# Patient Record
Sex: Female | Born: 1945
Health system: Southern US, Community
[De-identification: ages and names within clinical notes are randomized; demographics above are authoritative.]

## PROBLEM LIST (undated history)

## (undated) DIAGNOSIS — I2510 Atherosclerotic heart disease of native coronary artery without angina pectoris: Secondary | ICD-10-CM

## (undated) DIAGNOSIS — I10 Essential (primary) hypertension: Secondary | ICD-10-CM

## (undated) DIAGNOSIS — D72829 Elevated white blood cell count, unspecified: Principal | ICD-10-CM

## (undated) DIAGNOSIS — R011 Cardiac murmur, unspecified: Secondary | ICD-10-CM

## (undated) DIAGNOSIS — Z9889 Other specified postprocedural states: Secondary | ICD-10-CM

## (undated) DIAGNOSIS — I219 Acute myocardial infarction, unspecified: Secondary | ICD-10-CM

## (undated) DIAGNOSIS — H269 Unspecified cataract: Secondary | ICD-10-CM

## (undated) DIAGNOSIS — M1990 Unspecified osteoarthritis, unspecified site: Secondary | ICD-10-CM

## (undated) DIAGNOSIS — F32A Depression, unspecified: Secondary | ICD-10-CM

## (undated) DIAGNOSIS — T7840XA Allergy, unspecified, initial encounter: Secondary | ICD-10-CM

## (undated) DIAGNOSIS — Z79890 Hormone replacement therapy (postmenopausal): Secondary | ICD-10-CM

## (undated) DIAGNOSIS — C73 Malignant neoplasm of thyroid gland: Secondary | ICD-10-CM

## (undated) DIAGNOSIS — Z720 Tobacco use: Secondary | ICD-10-CM

## (undated) DIAGNOSIS — F419 Anxiety disorder, unspecified: Secondary | ICD-10-CM

## (undated) DIAGNOSIS — K589 Irritable bowel syndrome without diarrhea: Secondary | ICD-10-CM

## (undated) DIAGNOSIS — E785 Hyperlipidemia, unspecified: Secondary | ICD-10-CM

## (undated) DIAGNOSIS — E041 Nontoxic single thyroid nodule: Secondary | ICD-10-CM

## (undated) DIAGNOSIS — M8580 Other specified disorders of bone density and structure, unspecified site: Secondary | ICD-10-CM

## (undated) DIAGNOSIS — R112 Nausea with vomiting, unspecified: Secondary | ICD-10-CM

## (undated) DIAGNOSIS — K5790 Diverticulosis of intestine, part unspecified, without perforation or abscess without bleeding: Secondary | ICD-10-CM

## (undated) HISTORY — DX: Acute myocardial infarction, unspecified: I21.9

## (undated) HISTORY — PX: OTHER SURGICAL HISTORY: SHX169

## (undated) HISTORY — DX: Other specified disorders of bone density and structure, unspecified site: M85.80

## (undated) HISTORY — DX: Malignant neoplasm of thyroid gland: C73

## (undated) HISTORY — DX: Elevated white blood cell count, unspecified: D72.829

## (undated) HISTORY — PX: TUBAL LIGATION: SHX77

## (undated) HISTORY — DX: Unspecified cataract: H26.9

## (undated) HISTORY — DX: Hormone replacement therapy: Z79.890

## (undated) HISTORY — DX: Diverticulosis of intestine, part unspecified, without perforation or abscess without bleeding: K57.90

## (undated) HISTORY — DX: Tobacco use: Z72.0

## (undated) HISTORY — DX: Depression, unspecified: F32.A

## (undated) HISTORY — DX: Hyperlipidemia, unspecified: E78.5

## (undated) HISTORY — DX: Essential (primary) hypertension: I10

## (undated) HISTORY — DX: Allergy, unspecified, initial encounter: T78.40XA

## (undated) HISTORY — DX: Nontoxic single thyroid nodule: E04.1

## (undated) HISTORY — DX: Unspecified osteoarthritis, unspecified site: M19.90

## (undated) HISTORY — PX: SPINE SURGERY: SHX786

## (undated) HISTORY — DX: Irritable bowel syndrome, unspecified: K58.9

## (undated) HISTORY — DX: Cardiac murmur, unspecified: R01.1

## (undated) HISTORY — DX: Atherosclerotic heart disease of native coronary artery without angina pectoris: I25.10

---
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
## 1990-07-10 ENCOUNTER — Encounter (INDEPENDENT_AMBULATORY_CARE_PROVIDER_SITE_OTHER): Payer: Self-pay | Admitting: Gastroenterology

## 1998-07-29 ENCOUNTER — Other Ambulatory Visit: Admission: RE | Admit: 1998-07-29 | Discharge: 1998-07-29 | Payer: Self-pay | Admitting: Family Medicine

## 1999-04-20 DIAGNOSIS — I219 Acute myocardial infarction, unspecified: Secondary | ICD-10-CM

## 1999-04-20 HISTORY — DX: Acute myocardial infarction, unspecified: I21.9

## 1999-04-20 HISTORY — PX: CARDIAC CATHETERIZATION: SHX172

## 1999-08-12 ENCOUNTER — Other Ambulatory Visit: Admission: RE | Admit: 1999-08-12 | Discharge: 1999-08-12 | Payer: Self-pay | Admitting: Family Medicine

## 2000-03-28 ENCOUNTER — Inpatient Hospital Stay (HOSPITAL_COMMUNITY): Admission: EM | Admit: 2000-03-28 | Discharge: 2000-03-31 | Payer: Self-pay | Admitting: Emergency Medicine

## 2000-03-28 ENCOUNTER — Encounter: Payer: Self-pay | Admitting: Internal Medicine

## 2000-06-17 HISTORY — PX: BACK SURGERY: SHX140

## 2000-06-29 ENCOUNTER — Encounter: Payer: Self-pay | Admitting: Family Medicine

## 2000-06-29 ENCOUNTER — Ambulatory Visit (HOSPITAL_COMMUNITY): Admission: RE | Admit: 2000-06-29 | Discharge: 2000-06-29 | Payer: Self-pay | Admitting: Family Medicine

## 2000-06-30 ENCOUNTER — Encounter: Payer: Self-pay | Admitting: Neurosurgery

## 2000-06-30 ENCOUNTER — Inpatient Hospital Stay (HOSPITAL_COMMUNITY): Admission: EM | Admit: 2000-06-30 | Discharge: 2000-07-01 | Payer: Self-pay | Admitting: Emergency Medicine

## 2001-08-10 ENCOUNTER — Encounter: Payer: Self-pay | Admitting: Family Medicine

## 2001-08-10 ENCOUNTER — Encounter: Admission: RE | Admit: 2001-08-10 | Discharge: 2001-08-10 | Payer: Self-pay | Admitting: Family Medicine

## 2001-09-22 ENCOUNTER — Other Ambulatory Visit: Admission: RE | Admit: 2001-09-22 | Discharge: 2001-09-22 | Payer: Self-pay | Admitting: Family Medicine

## 2002-04-19 DIAGNOSIS — E041 Nontoxic single thyroid nodule: Secondary | ICD-10-CM

## 2002-04-19 DIAGNOSIS — C73 Malignant neoplasm of thyroid gland: Secondary | ICD-10-CM

## 2002-04-19 HISTORY — DX: Nontoxic single thyroid nodule: E04.1

## 2002-04-19 HISTORY — DX: Malignant neoplasm of thyroid gland: C73

## 2002-07-19 HISTORY — PX: CHOLECYSTECTOMY: SHX55

## 2002-08-09 ENCOUNTER — Encounter: Payer: Self-pay | Admitting: General Surgery

## 2002-08-14 ENCOUNTER — Encounter: Payer: Self-pay | Admitting: General Surgery

## 2002-08-14 ENCOUNTER — Encounter (INDEPENDENT_AMBULATORY_CARE_PROVIDER_SITE_OTHER): Payer: Self-pay | Admitting: Specialist

## 2002-08-14 ENCOUNTER — Observation Stay (HOSPITAL_COMMUNITY): Admission: RE | Admit: 2002-08-14 | Discharge: 2002-08-15 | Payer: Self-pay | Admitting: General Surgery

## 2002-08-23 ENCOUNTER — Encounter: Admission: RE | Admit: 2002-08-23 | Discharge: 2002-08-23 | Payer: Self-pay | Admitting: Family Medicine

## 2002-09-10 ENCOUNTER — Encounter: Payer: Self-pay | Admitting: General Surgery

## 2002-09-10 ENCOUNTER — Ambulatory Visit (HOSPITAL_COMMUNITY): Admission: RE | Admit: 2002-09-10 | Discharge: 2002-09-10 | Payer: Self-pay | Admitting: General Surgery

## 2002-10-03 ENCOUNTER — Encounter: Admission: RE | Admit: 2002-10-03 | Discharge: 2002-10-03 | Payer: Self-pay | Admitting: Family Medicine

## 2002-10-03 ENCOUNTER — Encounter: Payer: Self-pay | Admitting: Family Medicine

## 2003-01-31 ENCOUNTER — Other Ambulatory Visit: Admission: RE | Admit: 2003-01-31 | Discharge: 2003-01-31 | Payer: Self-pay | Admitting: Family Medicine

## 2003-02-18 HISTORY — PX: THYROIDECTOMY: SHX17

## 2003-08-18 HISTORY — PX: LIPOMA EXCISION: SHX5283

## 2003-10-04 ENCOUNTER — Encounter: Admission: RE | Admit: 2003-10-04 | Discharge: 2003-10-04 | Payer: Self-pay | Admitting: Family Medicine

## 2004-02-26 ENCOUNTER — Ambulatory Visit: Payer: Self-pay | Admitting: Cardiology

## 2004-10-06 ENCOUNTER — Encounter: Admission: RE | Admit: 2004-10-06 | Discharge: 2004-10-06 | Payer: Self-pay | Admitting: Family Medicine

## 2005-03-31 ENCOUNTER — Ambulatory Visit: Payer: Self-pay | Admitting: Cardiology

## 2005-04-08 ENCOUNTER — Ambulatory Visit: Payer: Self-pay

## 2005-10-07 ENCOUNTER — Encounter: Admission: RE | Admit: 2005-10-07 | Discharge: 2005-10-07 | Payer: Self-pay | Admitting: Family Medicine

## 2006-06-22 ENCOUNTER — Ambulatory Visit: Payer: Self-pay | Admitting: Cardiology

## 2006-11-03 ENCOUNTER — Encounter: Admission: RE | Admit: 2006-11-03 | Discharge: 2006-11-03 | Payer: Self-pay | Admitting: Family Medicine

## 2007-07-05 ENCOUNTER — Ambulatory Visit: Payer: Self-pay | Admitting: Cardiology

## 2007-11-17 ENCOUNTER — Encounter: Admission: RE | Admit: 2007-11-17 | Discharge: 2007-11-17 | Payer: Self-pay | Admitting: Family Medicine

## 2007-11-30 DIAGNOSIS — C73 Malignant neoplasm of thyroid gland: Secondary | ICD-10-CM

## 2007-11-30 DIAGNOSIS — I10 Essential (primary) hypertension: Secondary | ICD-10-CM | POA: Insufficient documentation

## 2007-11-30 DIAGNOSIS — K589 Irritable bowel syndrome without diarrhea: Secondary | ICD-10-CM | POA: Insufficient documentation

## 2007-11-30 DIAGNOSIS — E785 Hyperlipidemia, unspecified: Secondary | ICD-10-CM | POA: Insufficient documentation

## 2007-11-30 DIAGNOSIS — K219 Gastro-esophageal reflux disease without esophagitis: Secondary | ICD-10-CM | POA: Insufficient documentation

## 2007-11-30 DIAGNOSIS — M8580 Other specified disorders of bone density and structure, unspecified site: Secondary | ICD-10-CM | POA: Insufficient documentation

## 2007-11-30 DIAGNOSIS — I2510 Atherosclerotic heart disease of native coronary artery without angina pectoris: Secondary | ICD-10-CM | POA: Insufficient documentation

## 2007-11-30 HISTORY — DX: Malignant neoplasm of thyroid gland: C73

## 2007-12-06 ENCOUNTER — Ambulatory Visit: Payer: Self-pay | Admitting: Internal Medicine

## 2007-12-06 LAB — CONVERTED CEMR LAB: Sed Rate: 26 mm/hr — ABNORMAL HIGH (ref 0–22)

## 2007-12-07 LAB — CONVERTED CEMR LAB: Tissue Transglutaminase Ab, IgA: 0.2 units (ref <7)

## 2007-12-19 ENCOUNTER — Telehealth: Payer: Self-pay | Admitting: Internal Medicine

## 2007-12-19 ENCOUNTER — Ambulatory Visit: Payer: Self-pay | Admitting: Internal Medicine

## 2008-01-19 ENCOUNTER — Encounter: Payer: Self-pay | Admitting: Internal Medicine

## 2008-01-30 ENCOUNTER — Ambulatory Visit: Payer: Self-pay | Admitting: Internal Medicine

## 2008-09-18 ENCOUNTER — Ambulatory Visit: Payer: Self-pay | Admitting: Cardiology

## 2008-11-22 ENCOUNTER — Encounter: Admission: RE | Admit: 2008-11-22 | Discharge: 2008-11-22 | Payer: Self-pay | Admitting: Family Medicine

## 2009-10-29 ENCOUNTER — Ambulatory Visit: Payer: Self-pay | Admitting: Cardiology

## 2009-10-29 DIAGNOSIS — F17200 Nicotine dependence, unspecified, uncomplicated: Secondary | ICD-10-CM | POA: Insufficient documentation

## 2009-12-03 ENCOUNTER — Encounter: Admission: RE | Admit: 2009-12-03 | Discharge: 2009-12-03 | Payer: Self-pay | Admitting: Family Medicine

## 2010-05-19 NOTE — Assessment & Plan Note (Signed)
Summary: DIARRHEA IN AM/RH   History of Present Illness Visit Type: consult Primary GI MD: Lina Sar MD Primary Provider: Rudi Heap, M.D. Requesting Provider: Rudi Heap, M.D. Chief Complaint: diarrhea in a.m. History of Present Illness:   65 year old white female patient of Dr. Rudi Heap and Dr. Angeline Slim with chronic diarrhea. Her morning diarrhea has gotten worse in the last several months having 5-6 loose bowel movements a day starting around 5 in the morning when she gets up. She denies any nocturnal diarrhea, rectal bleeding or abdominal pain. Her weight has been stable. She had a flexible sigmoidoscopy by Dr.Bejou in 1992 which was normal. She subsequently had an attempt for colonoscopy but we don't have the record of it.  She had a very bad experience with pain and inadequate  sedation. Additional medical problems include coronary artery disease with occlusion of RCA in 2001, high blood pressure, hyperlipidemia, and follicular cancer of the thyroid. Patient is a smoker.  She has no symptoms of gastroesophageal reflux.   GI Review of Systems    Reports belching, bloating, chest pain, and  dysphagia with solids.      Denies abdominal pain, acid reflux, dysphagia with liquids, heartburn, loss of appetite, nausea, vomiting, vomiting blood, weight loss, and  weight gain.      Reports change in bowel habits, constipation, diarrhea, hemorrhoids, and  irritable bowel syndrome.     Denies anal fissure, black tarry stools, diverticulosis, fecal incontinence, heme positive stool, jaundice, light color stool, liver problems, rectal bleeding, and  rectal pain.     Updated Prior Medication List: SYNTHROID 137 MCG  TABS (LEVOTHYROXINE SODIUM) 1 tablet by mouth once daily METOPROLOL TARTRATE 50 MG  TABS (METOPROLOL TARTRATE) 1/2 tablet by mouth two times a day DIOVAN 160 MG  TABS (VALSARTAN) 1 tablet by mouth once daily SIMCOR 1000-20 MG  XR24H-TAB (NIACIN-SIMVASTATIN) 2 tablets by  mouth once daily ALENDRONATE SODIUM 70 MG  TABS (ALENDRONATE SODIUM) 1 tablet by mouth weekly VITAMIN D3 1000 UNIT  CAPS (CHOLECALCIFEROL) 1 tablet by mouth once daily LOVAZA 1 GM  CAPS (OMEGA-3-ACID ETHYL ESTERS) 1 tablet by mouth once daily CALCIUM CITRATE + D 300-100 MG-UNIT  TABS (CALCIUM CITRATE-VITAMIN D) 1 tablet once daily MULTIPLE VITAMINS   TABS (MULTIPLE VITAMIN) 1 tablet by mouth once daily ALIGN   CAPS (MISC INTESTINAL FLORA REGULAT) 1 tablet by mouth once daily TYLENOL PM EXTRA STRENGTH 500-25 MG  TABS (DIPHENHYDRAMINE-APAP (SLEEP)) 1 tablet by mouth as needed CO Q-10 150 MG  CAPS (COENZYME Q10) 1 tablet by mouth once daily  Current Allergies (reviewed today): ! [facility]  Past Medical History:    Reviewed history from 11/30/2007 and no changes required:       Current Problems:        GERD (ICD-530.81)       IRRITABLE BOWEL SYNDROME (ICD-564.1)       Hx of THYROID CANCER (ICD-193)       OSTEOPOROSIS (ICD-733.00)       DYSLIPIDEMIA (ICD-272.4)       HYPERTENSION (ICD-401.9)       CORONARY ARTERY DISEASE (ICD-414.00)       DIARRHEA (ICD-787.91)         Past Surgical History:    Reviewed history from 11/30/2007 and no changes required:       Appendectomy       tubal ligation       lumbar laminectomy       thyroidectomy       lipoma  removal rt. arm       cholecystecomy   Family History:    Reviewed history from 11/30/2007 and no changes required:       Family History of Heart Disease: Father       Family History of Breast Cancer:maternal grandmother              No FH of Colon Cancer:  Social History:    Reviewed history from 11/30/2007 and no changes required:       Patient currently smokes. -1/2 ppd       Alcohol Use - no       Illicit Drug Use - no       Daily Caffeine Use   Risk Factors:  Caffeine use:  1 drinks per day   Review of Systems       The patient complains of allergy/sinus, back pain, cough, fatigue, itching, night sweats, and  sleeping problems.         .ros   Vital Signs:  Patient Profile:   65 Years Old Female Height:     65 inches Weight:      165.38 pounds BMI:     27.62 BSA:     1.83 Pulse rate:   68 / minute Pulse rhythm:   regular BP sitting:   122 / 78  (left arm)  Vitals Entered By: Milford Cage CMA (December 06, 2007 9:11 AM)                  Physical Exam  General:     Well developed, well nourished, no acute distress. Neck:     post thyroidectomy scar; no nodules, no tenderness Lungs:     Clear throughout to auscultation. Heart:     Regular rate and rhythm; no murmurs, rubs,  or bruits. Abdomen:     abdomen is soft, mildly obese with normoactive bowel sounds. No distention, no tenderness.Liver edge at costal margin, post appendectomy scar RLQ,  laparoscopic cholecystectomy scar in right upper quadrant. Rectal:     normal rectal tone. Soft Hemoccult-negative stool in small amounts. Extremities:     No clubbing, cyanosis, edema or deformities noted. Neurologic:     Alert and  oriented x4;  grossly normal neurologically.    Impression & Recommendations:  Problem # 1:  IRRITABLE BOWEL SYNDROME (ICD-564.1) irritable bowel syndrome with predominant diarrhea, possible contributing factor is bile  overflow diarrhea since 2004 cholecystectomy. We need to rule out possibility of collagenous or microscopic colitis. We also need to rule out celiac sprue although I doubt that is the problem. The gas and diarrhea seem to be very inconvenient for the patient.  She has to  work every day  and has to get up 2 hours ahead of time to be able to make it to work.  She denies any accidents but has limited her social life because of the diarrhea. Orders: T-Tissue Transglutamase Ab IgA [id number] TLB-Sedimentation Rate (ESR) (85651-ESR) Colonoscopy (Colon)   Problem # 2:  GERD (ICD-530.81) currently not a problem.  Problem # 3:  Hx of THYROID CANCER (ICD-193) vault and the [facility]  in [place]. Patient has been followed every 6 months. Thyroid status has been in good control.   Patient Instructions: 1)  Colestid 1g 2 tablets q.am. 2)  Bentyl 20 mg one p.o. b.i.d. 3)  Colonoscopy with appropriate biopsies 4)  Tissue transglutaminase levels and sed rate today    Prescriptions: BENTYL 20 MG  TABS (DICYCLOMINE HCL) Take  1 tablet by mouth two times a day  #60 x 6   Entered by:   Hortense Ramal CMA   Authorized by:   Hart Carwin MD   Signed by:   Hortense Ramal CMA on 12/06/2007   Method used:   Electronically sent to ...       [facility]       [address]       [place], [place]  [address]       Ph: [phone]       Fax: [phone]   RxID:   [id number] COLESTID 1 GM  TABS (COLESTIPOL HCL) Take 2 tablets by mouth every morning.  #60 x 6   Entered by:   Hortense Ramal CMA   Authorized by:   Hart Carwin MD   Signed by:   Hortense Ramal CMA on 12/06/2007   Method used:   Electronically sent to ...       [facility]*       [address]       [place], [place]  [address]       Ph: [phone]       Fax: [phone]   RxID:   [id number] DULCOLAX 5 MG  TBEC (BISACODYL) Day before procedure take 2 at 3pm and 2 at 8pm.  #4 x 0   Entered by:   Hortense Ramal CMA   Authorized by:   Hart Carwin MD   Signed by:   Hortense Ramal CMA on 12/06/2007   Method used:   Electronically sent to ...       [facility]*       [address]       [place], [place]  [address]       Ph: [phone]       Fax: [phone]   RxID:   [id number] REGLAN 10 MG  TABS (METOCLOPRAMIDE HCL) As per prep instructions.  #2 x 0   Entered by:   Hortense Ramal CMA   Authorized by:   Hart Carwin MD   Signed by:   Hortense Ramal CMA on 12/06/2007   Method used:   Electronically sent to ...       [facility]*       [address]       [place], [place]  [address]       Ph: [phone]       Fax: [phone]   RxID:   [id number] MIRALAX   POWD (POLYETHYLENE GLYCOL 3350) As per prep  instructions.  #255gm x 0   Entered by:   Hortense Ramal CMA   Authorized by:   Hart Carwin MD   Signed by:   Hortense Ramal CMA on 12/06/2007   Method used:   Electronically sent to ...       [facility]*       [address]       [place], [place]  [address]       Ph: [phone]       Fax: [phone]   RxID:   [id number]  ]

## 2010-05-19 NOTE — Assessment & Plan Note (Signed)
Summary: Madison [name]  Medications Added ASPIRIN 325 MG  TABS (ASPIRIN) 1 by mouth dialy NITROSTAT 0.4 MG SUBL (NITROGLYCERIN) one every 5 minutes under tongue up to three times for chest pain, then call 911 if not resolved      Allergies Added:   Visit Type:  Follow-up Primary Provider:  Rudi Heap, M.D.  CC:  CAD.  History of Present Illness: The patient returns for yearly followup. Since I last saw her she has had no new cardiac problems. She has broken her foot twice and is just now recovering from this. She hasn't been able to ambulate because of this. She denies any chest pressure, neck or arm discomfort. She denies any palpitations, presyncope or syncope. She has no PND or orthopnea. I did review her lipids. Her LDL was excellent at 66 with an HDL of 37. The triglycerides were 214. Unfortunately she continues to smoke cigarettes.  Current Medications (verified): 1)  Synthroid 137 Mcg  Tabs (Levothyroxine Sodium) .Marland Kitchen.. 1 Tablet By Mouth Once Daily 2)  Metoprolol Tartrate 50 Mg  Tabs (Metoprolol Tartrate) .... 1/2 Tablet By Mouth Two Times A Day 3)  Diovan 160 Mg  Tabs (Valsartan) .Marland Kitchen.. 1 Tablet By Mouth Once Daily 4)  Simcor 1000-20 Mg  Xr24h-Tab (Niacin-Simvastatin) .... 2 Tablets By Mouth Once Daily 5)  Vitamin D3 1000 Unit  Caps (Cholecalciferol) .Marland Kitchen.. 1 Tablet By Mouth Once Daily 6)  Calcium Citrate + D 300-100 Mg-Unit  Tabs (Calcium Citrate-Vitamin D) .Marland Kitchen.. 1 Tablet Once Daily 7)  Multiple Vitamins   Tabs (Multiple Vitamin) .Marland Kitchen.. 1 Tablet By Mouth Once Daily 8)  Tylenol Pm Extra Strength 500-25 Mg  Tabs (Diphenhydramine-Apap (Sleep)) .Marland Kitchen.. 1 Tablet By Mouth As Needed 9)  Co Q-10 150 Mg  Caps (Coenzyme Q10) .Marland Kitchen.. 1 Tablet By Mouth Once Daily 10)  Aspirin 325 Mg  Tabs (Aspirin) .Marland Kitchen.. 1 By Mouth Dialy 11)  Nitrostat 0.4 Mg Subl (Nitroglycerin) .... One Every 5 Minutes Under Tongue Up To Three Times For Chest Pain, Then Call 911 If Not Resolved  Allergies (verified): 1)  !  Wellbutrin  Past History:  Past Medical History: GERD (ICD-530.81) IRRITABLE BOWEL SYNDROME (ICD-564.1) Hx of THYROID CANCER (ICD-193) OSTEOPOROSIS (ICD-733.00) DYSLIPIDEMIA (ICD-272.4) HYPERTENSION (ICD-401.9) CORONARY ARTERY DISEASE (ICD-414.00) (occluded right coronary artery in 2001.  A       70% LAD stenosis followed by 50% stenosis, 30% proximal circumflex       stenosis, EF 55%.  She had PTCA and stenting of the right coronary       artery).  DIARRHEA (ICD-787.91)  Past Surgical History: Appendectomy Tubal ligation Lumbar laminectomy Thyroidectomy Lpoma removal rt. arm Cholecystecomy  Review of Systems       As stated in the HPI and negative for all other systems.   Vital Signs:  Patient profile:   65 year old female Height:      65 inches Weight:      168 pounds BMI:     28.06 Pulse rate:   58 / minute Resp:     18 per minute BP sitting:   126 / 78  (right arm)  Vitals Entered By: Marrion Coy, CNA (October 29, 2009 9:36 AM)  Physical Exam  General:  Well developed, well nourished, in no acute distress. Head:  normocephalic and atraumatic Eyes:  PERRLA/EOM intact; conjunctiva and lids normal. Mouth:  Teeth, gums and palate normal. Oral mucosa normal. Neck:  Neck supple, no JVD. No masses, thyromegaly or abnormal cervical nodes. Chest Wall:  no deformities or breast masses noted Lungs:  Clear bilaterally to auscultation and percussion. Abdomen:  Bowel sounds positive; abdomen soft and non-tender without masses, organomegaly, or hernias noted. No hepatosplenomegaly. Msk:  Back normal, normal gait. Muscle strength and tone normal. Extremities:  No clubbing or cyanosis. Neurologic:  Alert and oriented x 3. Skin:  Intact without lesions or rashes. Cervical Nodes:  no significant adenopathy Axillary Nodes:  no significant adenopathy Inguinal Nodes:  no significant adenopathy Psych:  Normal affect.   Detailed Cardiovascular Exam  Neck    Carotids:  Carotids full and equal bilaterally without bruits.      Neck Veins: Normal, no JVD.    Heart    Inspection: no deformities or lifts noted.      Palpation: normal PMI with no thrills palpable.      Auscultation: regular rate and rhythm, S1, S2 without murmurs, rubs, gallops, or clicks.    Vascular    Abdominal Aorta: no palpable masses, pulsations, or audible bruits.      Femoral Pulses: normal femoral pulses bilaterally.      Pedal Pulses: normal pedal pulses bilaterally.      Radial Pulses: normal radial pulses bilaterally.      Peripheral Circulation: no clubbing, cyanosis, or edema noted with normal capillary refill.     EKG  Procedure date:  10/29/2009  Findings:      Sinus rhythm, rate of 50, axis within normal limits, intervals within normal limits, no acute ST-T wave changes.  Impression & Recommendations:  Problem # 1:  CORONARY ARTERY DISEASE (ICD-414.00) She has no new symptoms. No further cardiovascular testing is suggested. She needs more aggressive risk reduction. Orders: EKG w/ Interpretation (93000)  Problem # 2:  DYSLIPIDEMIA (ICD-272.4) Though she is not at target for her HDL I would suggest lifestyle changes to include exercise and diet before changing medications but I will defer to her primary physician.  Problem # 3:  HYPERTENSION (ICD-401.9) Her blood pressure is controlled. She will continue meds as listed. Orders: EKG w/ Interpretation (93000)  Problem # 4:  TOBACCO ABUSE (ICD-305.1) We discussed the need to stop smoking. She has failed multiple therapies but we'll continue to work on this.  Patient Instructions: 1)  Your physician recommends that you schedule a follow-up appointment in: 12 months with Dr Antoine Poche in [place] 2)  Your physician recommends that you continue on your current medications as directed. Please refer to the Current Medication list given to you today. Prescriptions: METOPROLOL TARTRATE 50 MG  TABS (METOPROLOL TARTRATE) 1/2  tablet by mouth two times a day  #90 x 3   Entered by:   Charolotte Capuchin, RN   Authorized by:   Rollene Rotunda, MD, [facility]   Signed by:   Charolotte Capuchin, RN on 10/29/2009   Method used:   Print then Give to Patient   RxID:   [id number] NITROSTAT 0.4 MG SUBL (NITROGLYCERIN) one every 5 minutes under tongue up to three times for chest pain, then call 911 if not resolved  #25 x prn   Entered by:   Charolotte Capuchin, RN   Authorized by:   Rollene Rotunda, MD, [facility]   Signed by:   Charolotte Capuchin, RN on 10/29/2009   Method used:   Electronically to        [facility] ([facility])       [address]       [place], [place]  [address]       Ph: [phone]       Fax: [phone]   RxID:   [id number]

## 2010-05-19 NOTE — Assessment & Plan Note (Signed)
Summary: followup after colon/tcw   History of Present Illness Visit Type: follow up Primary GI MD: Lina Sar MD Primary Provider: Rudi Heap, M.D. Requesting Provider: Rudi Heap, M.D. Chief Complaint: follow up appt colon History of Present Illness:   65 year old white female with irritable bowel syndrome and moderately severe diverticulosis of the left colon with partial obstruction  on colonoscopy on 12/19/07. She has fecal  urgency and occasional incontinence. She has been markedly improved on Bentyl  20 mg twice a day, Colestid 2 g a day. Unfortunately the Colestid interfered with the absorption of the Synthroid according to her endocrinologist Dr Nonie Hoyer. . Patient presented with severe diarrhea. and urgency incontinence. She is about 50% improved.   GI Review of Systems    Reports bloating.      Denies abdominal pain, acid reflux, belching, chest pain, dysphagia with liquids, dysphagia with solids, heartburn, loss of appetite, nausea, vomiting, vomiting blood, weight loss, and  weight gain.      Reports diarrhea, diverticulosis, and  irritable bowel syndrome.     Denies anal fissure, black tarry stools, change in bowel habit, constipation, fecal incontinence, heme positive stool, hemorrhoids, jaundice, light color stool, liver problems, rectal bleeding, and  rectal pain.     Prior Medication List:  SYNTHROID 137 MCG  TABS (LEVOTHYROXINE SODIUM) 1 tablet by mouth once daily METOPROLOL TARTRATE 50 MG  TABS (METOPROLOL TARTRATE) 1/2 tablet by mouth two times a day DIOVAN 160 MG  TABS (VALSARTAN) 1 tablet by mouth once daily SIMCOR 1000-20 MG  XR24H-TAB (NIACIN-SIMVASTATIN) 2 tablets by mouth once daily ALENDRONATE SODIUM 70 MG  TABS (ALENDRONATE SODIUM) 1 tablet by mouth weekly VITAMIN D3 1000 UNIT  CAPS (CHOLECALCIFEROL) 1 tablet by mouth once daily LOVAZA 1 GM  CAPS (OMEGA-3-ACID ETHYL ESTERS) 1 tablet by mouth once daily CALCIUM CITRATE + D 300-100 MG-UNIT  TABS (CALCIUM  CITRATE-VITAMIN D) 1 tablet once daily MULTIPLE VITAMINS   TABS (MULTIPLE VITAMIN) 1 tablet by mouth once daily ALIGN   CAPS (MISC INTESTINAL FLORA REGULAT) 1 tablet by mouth once daily TYLENOL PM EXTRA STRENGTH 500-25 MG  TABS (DIPHENHYDRAMINE-APAP (SLEEP)) 1 tablet by mouth as needed CO Q-10 150 MG  CAPS (COENZYME Q10) 1 tablet by mouth once daily COLESTID 1 GM  TABS (COLESTIPOL HCL) Take 2 tablets by mouth every morning. BENTYL 20 MG  TABS (DICYCLOMINE HCL) Take 1 tablet by mouth two times a day   Current Allergies (reviewed today): ! [facility]  Past Medical History:    Reviewed history from 11/30/2007 and no changes required:       Current Problems:        GERD (ICD-530.81)       IRRITABLE BOWEL SYNDROME (ICD-564.1)       Hx of THYROID CANCER (ICD-193)       OSTEOPOROSIS (ICD-733.00)       DYSLIPIDEMIA (ICD-272.4)       HYPERTENSION (ICD-401.9)       CORONARY ARTERY DISEASE (ICD-414.00)       DIARRHEA (ICD-787.91)         Past Surgical History:    Reviewed history from 12/06/2007 and no changes required:       Appendectomy       tubal ligation       lumbar laminectomy       thyroidectomy       lipoma removal rt. arm       cholecystecomy   Family History:    Reviewed history from 12/06/2007 and no  changes required:       Family History of Heart Disease: Father       Family History of Breast Cancer:maternal grandmother              No FH of Colon Cancer:  Social History:    Reviewed history from 12/06/2007 and no changes required:       Patient currently smokes. -1/2 ppd       Alcohol Use - no       Illicit Drug Use - no       Daily Caffeine Use    Review of Systems       The patient complains of allergy/sinus and back pain.     Vital Signs:  Patient Profile:   65 Years Old Female Height:     65 inches Weight:      163.50 pounds BMI:     27.31 BSA:     1.82 Pulse rate:   78 / minute Pulse rhythm:   regular BP sitting:   170 / 88  (right arm)   Vitals Entered By: Paulene Floor, RN (January 30, 2008 3:58 PM)                    Impression & Recommendations:  Problem # 1:  IRRITABLE BOWEL SYNDROME (ICD-564.1) irritable bowel syndrome and symptomatic diverticulosis of the left colon improved on regimen of  Colestid and antispasmodic. I have suggested adding Imodium 1 po  at bedtime. I have also given her samples of Metamucil to increase  the bulk of her stool  Problem # 2:  Hx of THYROID CANCER (ICD-193) on thyroid supplements. We will give  her Colestid to  at least 2 hours apart from all her other medications to prevent an interference with absorption of the remaining medications.she will have her TSH  rechecked  by Dr Nonie Hoyer in 6 weeks.   Patient Instructions: 1)  Colestid 2 g take at least 2 hours apart from all of the medications 2)  Continue all other medications 3)  A TSH checked as per Dr. Nonie Hoyer 4)   Imodium one at bedtime 5)  We discussed the possibility of segmental resection of her sigmoid colon if her symptoms become intolerable. At this time at would not recommend surgery    ]

## 2010-08-19 ENCOUNTER — Encounter: Payer: Self-pay | Admitting: Family Medicine

## 2010-09-01 NOTE — Assessment & Plan Note (Signed)
[facility]                            CARDIOLOGY OFFICE NOTE   NAME:Madison [name], Madison [name]                       MRN:          [id number]  DATE:07/05/2007                            DOB:          10-Jun-1945    PRIMARY CARE PHYSICIAN:  Ernestina Penna, M.D.   REASON FOR PRESENTATION:  Coronary disease.   HISTORY OF PRESENT ILLNESS:  The patient returns for yearly followup.  She has done quite well since I last saw her.  She bought a treadmill.  She is walking daily.  She has not been having any chest discomfort,  neck or arm discomfort.  She has not been having any palpitations,  presyncope or syncope.  She is having no PND or orthopnea.Marland Kitchen  Unfortunately she is still smoking cigarettes.   PAST MEDICAL HISTORY:  Coronary artery disease (occluded right coronary  artery in 2001, 70% LAD stenosis followed by 50% stenosis, 30% proximal  circumflex stenosis, EF 55%.  The patient had PTCA and stenting of the  right coronary artery.)  Hypertension, dyslipidemia with a low HDL and  elevated triglycerides, tobacco use, osteoporosis, follicular cell  thyroid cancer.   ALLERGIES AND INTOLERANCES:  WELLBUTRIN.   MEDICATIONS:  Metoprolol 50 mg b.i.d., Fosamax 70 mg weekly, Synthroid  137 mcg daily with 1-1/2 tablets on Sunday, multivitamin, Diovan 160 mg  daily, Simcor 1000/20 two tablets daily, vitamin D, [place] 1000 mg  daily, calcium, Align, aspirin 325 mg daily.   REVIEW OF SYSTEMS:  As stated in HPI, otherwise negative for other  systems.   PHYSICAL EXAMINATION:  GENERAL:  The patient is in no distress.  Blood  pressure 130/86, heart rate 67 and regular.  NECK:  No jugular distention at 45 degrees.  Carotid upstrokes brisk and  symmetrical.  No bruits, no thyromegaly.  LYMPHATICS:  No cervical,  axillary, or inguinal adenopathy.  LUNGS:  Decreased breath sounds without wheezing or crackles.  BACK:  No costovertebral angle tenderness.  CHEST:  Unremarkable.  HEART:  PMI not displaced or sustained.  S1 and S2 within normal limits.  No S3, no S4.  No clicks, rubs, murmurs.  ABDOMEN:  Obese.  Positive bowel sounds.  Normal in frequency and pitch.  No bruits, rebound, guarding or midline pulsatile mass.  No hepatomegaly  or splenomegaly.  SKIN:  No rashes.  No nodules.  EXTREMITIES:  2+ pulses throughout.  No edema, cyanosis or clubbing.  NEURO:  Oriented to person, place, and time.  Cranial nerves II-XII  grossly intact.  Motor grossly intact.   EKG sinus rhythm, rate 67, axis within normal limits, intervals within  normal limits, no acute ST-T wave changes.   ASSESSMENT/PLAN:  1. Coronary disease.  The patient is not having any ongoing symptoms.      No further cardiovascular testing is suggested.  She will continue      with risk reduction.  2. Tobacco.  We spent greater than 3 minutes talking about the need to      stop smoking.  I encouraged her to use Chantix. She is being  referred to the pharmacist of [facility] to discuss this.  3. Dyslipidemia.  This being followed by Dr. Christell Constant.  He recently upped      her medications.  4. Hypertension.  Blood pressure is well controlled on medications as      listed.  5. Followup.  I will see her back in 1 year or sooner if needed.Rollene Rotunda, MD, [facility]  Electronically Signed    JH/MedQ  DD: 07/05/2007  DT: 07/05/2007  Job #: [id number]   cc:   Ernestina Penna, M.D.

## 2010-09-01 NOTE — Assessment & Plan Note (Signed)
[facility]                            CARDIOLOGY OFFICE NOTE   NAME:Madison [name], Madison [name]                       MRN:          [id number]  DATE:09/18/2008                            DOB:          [birth year]    PRIMARY CARE PHYSICIAN:  Ernestina Penna, M.D.   REASON FOR PRESENTATION:  Evaluate the patient with coronary disease.   HISTORY OF PRESENT ILLNESS:  The patient presents for followup of the  above.  It has been 1 year since I last saw her.  She has had no new  problems since that time.  She occasionally walks on a treadmill.  With  this, she says that she does not get any chest discomfort, neck or arm  discomfort.  She has no new shortness of breath and denies any PND or  orthopnea.  She has no palpitations, presyncope, or syncope.  Unfortunately, she is still smoking cigarettes.   PAST MEDICAL HISTORY:  1. Coronary artery disease (occluded right coronary artery in 2001.  A      70% LAD stenosis followed by 50% stenosis, 30% proximal circumflex      stenosis, EF 55%.  She had PTCA and stenting of the right coronary      artery).  2. Hypertension.  3. Dyslipidemia with a low HDL and elevated triglycerides.  4. Tobacco use.  5. Osteoporosis.  6. Follicular cell thyroid cancer.  7. Irritable bowel syndrome.   ALLERGIES/INTOLERANCES:  WELLBUTRIN.   MEDICATIONS:  Metoprolol 25 mg b.i.d., Fosamax 70 mg weekly, Synthroid  137 mcg 1-1/2 tablet daily, multivitamin, Diovan 160 mg daily, Simcor  1000/20 two tablets daily, vitamin D, calcium, aspirin 325 mg daily,  fish oil, coenzyme Q.   REVIEW OF SYSTEMS:  As stated in the HPI and otherwise negative for all  other systems.   PHYSICAL EXAMINATION:  GENERAL:  The patient is in no distress.  VITAL SIGNS:  Blood pressure 112/70, heart rate 69 and regular, weight  164 pounds, body mass index 28.  HEENT:  Eyes unremarkable, pupils equal, round and reactive to light,  fundi not visualized, oral mucosa  unremarkable.  NECK:  No jugular venous distention at 45 degrees, carotid upstroke  brisk and symmetric, no bruise, no thyromegaly.  LYMPHATICS:  No cervical, axillary, inguinal adenopathy.  LUNGS:  Clear to auscultation bilaterally.  BACK:  No costovertebral angle tenderness.  CHEST:  Unremarkable.  HEART:  PMI not displaced or sustained. S1 and S2 within normal limits.  No S3, no S4, no clicks, no rubs, no murmurs.  ABDOMEN:  Flat, positive bowel sounds, normal in frequency in pitch, no  bruise, no rebound, no guarding, no pulsatile mass, no hepatomegaly, no  splenomegaly.  SKIN:  No rashes, no nodules.  EXTREMITIES:  Pulses 2+ throughout.  No edema, no cyanosis, no clubbing.  NEURO:  Oriented to person, place, and time.  Cranial nerves II through  XII grossly intact, motor grossly intact.   EKG:  Sinus rhythm, rate 69, axis within normal limits, intervals within  normal limits, no acute ST-T wave changes.   ASSESSMENT AND PLAN:  1. Coronary artery disease.  The patient is having no new symptoms.      No further cardiovascular testing is suggested.  She will continue      with risk reduction.  2. Dyslipidemia.  She is on aggressive therapy.  She is having this      followed carefully at [facility].  I will      suggest no change to her current regimen.  3. Tobacco.  She failed Chantix.  We discussed using a lower dose of      nicotine patch since a higher dose made her feel jittery.  She      cannot chew the gum.  She understands the importance of needing to      quit cigarettes.  We discussed this (greater than 3 minutes).  4. Follow up will be in 1 year or sooner if needed.     Rollene Rotunda, MD, [facility]  Electronically Signed    JH/MedQ  DD: 09/18/2008  DT: 09/19/2008  Job #: [id number]   cc:   Ernestina Penna, M.D.

## 2010-09-04 NOTE — Op Note (Signed)
NAME:  Madison [name], Madison [name]                          ACCOUNT NO.:  [id number]   MEDICAL RECORD NO.:  [id number]                   PATIENT TYPE:  AMB   LOCATION:  DAY                                  FACILITY:  [facility]   PHYSICIAN:  Anselm Pancoast. Zachery Dakins, M.D.          DATE OF BIRTH:  07-Jul-1945   DATE OF PROCEDURE:  08/14/2002  DATE OF DISCHARGE:                                 OPERATIVE REPORT   PREOPERATIVE DIAGNOSIS:  Chronic cholecystitis.   POSTOPERATIVE DIAGNOSIS:  Chronic cholecystitis with stones.   OPERATION:  Laparoscopic cholecystectomy with cholangiogram.   ANESTHESIA:  General.   SURGEON:  Anselm Pancoast. Zachery Dakins, M.D.   ASSISTANT:  Donnie Coffin. Samuella Cota, M.D.   HISTORY:  [name] is a 65 year old female that I saw in the office  approximately a week ago after she had had an episode of epigastric pain.  About two weeks earlier, she said she had had milder episodes of pain off  and on for approximately 2 or 3 days. She saw her regular physician, Dr.  Gaynelle Cage in [place] or [place] and scheduled her for an ultrasound that showed  a thickened gallbladder with stones. She was also noted to have some  echogenic masses of the liver that were thought to be angiomas. The patient  several years ago had a myocardial infarction and then had a stent placed by  Dr. Charlies Constable. She was on aspirin and other medication and when I saw her  in the office, she was not acutely tender but I recommended that we go ahead  and schedule her for a laparoscopic cholecystectomy and let her taper down  from an adult aspirin to a baby aspirin and day and I scheduled her for  surgery at this time. The patient had preoperative liver function studies  that showed a slightly elevated SGOT, but she has not chills or fever and  she said over the last week she has had a few little episodes of pain over  the right sub-rib area but not anything bad like she had approximately three  weeks earlier. Her white count is  11,300, hematocrit is 42 and her SGPT was  60 and her alkaline phosphatase is 120. Preoperatively, she was given 3 g of  Unasyn, she has PAS stockings and taken to the operative suite.   DESCRIPTION OF PROCEDURE:  The abdomen was prepped with Betadine surgical  scrub and solution, draped in a sterile manner. The endotracheal tube, of  course, had been placed and an oral tube into her stomach and then we made a  small incision below the umbilicus. The fascia was identified, picked up  between two Kocher's and a small opening carefully made into the peritoneal  cavity. She had a previous laparoscopic tubal ligation and we made this  little vertical incision. The pursestring suture of #0 Vicryl was placed and  the camera inserted. You could see the distended thickened  gallbladder not  acutely inflamed but sort of subacutely inflamed. The upper 10 mm trocar was  placed in the subxiphoid area. The hemangioma in the left lobe of the liver  could be visualized and it was not a typical capillary of angioma and the  other two in the dome of the right lobe. I did not actually visualize them.  The two lateral 5 mm trocars were placed by Dr. Samuella Cota after anesthetizing  the fascia and then the gallbladder was retracted upward and outward. There  was a stone impacted in the neck of the gallbladder and we kind of carefully  opened the peritoneum and then pushed the stone back up into the body of the  gallbladder and this helped with the identification of the structures. The  cystic duct was separated from the surrounding tissue and was mildly  inflamed because of the stone impacted and then we clipped the cystic duct  flush with the gallbladder and a small opening was made and a Cooke catheter  introduced and x-ray was obtained. It showed good prompt filling of the  extrahepatic biliary system, good flow into the duodenum and refluxed up  into the right and left lobes of the liver. We then removed the  catheter and  triply clipped the cystic duct and divided it and then we identified the  posterior branch of the cystic artery and anterior branch, these were doubly  clipped proximally, distally singly an divided. And then as we were well  probably an inch to an inch and a half up on the gallbladder it was kind of  a long, narrow, thinly-shaped shaped gallbladder. We noticed a little duct  going directly into the posterior portion of the gallbladder like the duct  of the Luschka. This fortunately was visualized and we could get three clips  on it on the liver side and you could see where it actually had entered into  the gallbladder. Next, we went ahead and freed the rest of the gallbladder  up part of it was intrahepatic and then grasped the gallbladder and the  camera and the upper 10 mm port and brought it up through the fascia. I  opened the gallbladder and decompressed it and then this largest stone was  big enough that it would not come through the fascia. At first we were  trying to see if we could bring the stone out but the gallbladder itself was  thin walled and _______and I elected to open the fascia slightly so I could  bring it through the fascia. Next, we inspected the gallbladder and you  could see this little duct that entered the proximal portion of the  gallbladder. Next, we reinspected, there was no evidence of any bile  spillage and no bleeding. I elected to place a 77 Blake drain into the bed  of the gallbladder in case this little duct of Luschka that is clipped does  leak and otherwise will remove the drain in the office in about 3 or 4 days.  The drain was brought out through the lateral most 5 mm port, cut off and  placed up in the bed of the gallbladder and then sutured to the skin with a  suture. Next, the camera again was switched to the upper 10 mm port and the fascia sutured with a figure-of-eight of #0 Vicryl and the pursestring  suture of #0 Vicryl, both  sutures were tied and then the fascia the  umbilicus was anesthetized. The drain  was in good position and we did have  the other 5 mm port withdrawn. The carbon dioxide released, the upper 10 mm  trocar withdrawn under direct vision. The subcutaneous wounds were closed  with 4-0 Vicryl and Benzoin and Steri-Strips used to close the skin. The  patient tolerated the procedure nicely and was taken to the recovery room in  stable postop condition. The drain has a small amount of irrigating fluid  but no actual bile in it at this time.                                                Anselm Pancoast. Zachery Dakins, M.D.    WJW/MEDQ  D:  08/14/2002  T:  08/14/2002  Job:  [id number]

## 2010-09-04 NOTE — H&P (Signed)
[place]  Patient:    Madison [name], Madison [name]                       MRN: [id number] Adm. Date:  [id number] Disc. Date: [id number] Attending:  Pricilla Riffle                         History and Physical  CHIEF COMPLAINT: Madison [name] is a lady who was seen today in my office as an emergency because of sharp pain going down the right leg and right foot associated with numbness and weakness.  HISTORY OF PRESENT ILLNESS: The problem started about a week ago and despite conservative treatment including prednisone, Tylox, and a muscle relaxant she is not any better.  The patient had an MRI performed and because of increasing pain she was sent to our office.  By the time I saw her in my office she was on the floor because that was the only way she was able to get rid of the pain.  She denies any pain in the left leg.  She was quite miserable.  She was unable to stand and we had to help her get around.  PAST MEDICAL/SURGICAL HISTORY: She had an MI back in December 2001 and had a stent inserted.  ALLERGIES: No known drug allergies.  SOCIAL HISTORY: The patient does not smoke and does not drink.  FAMILY HISTORY: Unremarkable.  REVIEW OF SYSTEMS: Positive for some history of chest pain, right leg and back pain.  CURRENT MEDICATIONS:  1. Medication for high blood pressure.  2. Medication for cholesterol.  3. Calcium.  PHYSICAL EXAMINATION:  VITAL SIGNS: Weight 155 pounds.  Height 5 feet 6 inches.  GENERAL: She was quite miserable and was on the floor in my examining room with her leg flexed.  Her husband and I had to help her on the examination table.  HEENT: Normal.  NECK: Normal.  CARDIAC: Heart sounds normal.  ABDOMEN: Normal.  EXTREMITIES: Normal pulses.  NEUROLOGIC: Mental status normal.  Cranial nerves normal.  She had weakness with 4/5 plantar flexion and extension on the right and the left leg is completely normal.  The right ankle jerk is  decreased in relation to the left. The rest are normal.  Sensation is normal though she complains of some tingling sensation in the third and fourth toes of the right foot.  Straight leg raising on the left side is negative at 90 degrees and the right side is positive at 30 degrees.  She is quite tender in the sciatic notch.  LABORATORY DATA: MRI shows some degenerative disk disease at the level of L5-S1 and L4-5 but she has a large herniated disk centrally with a fragment going distally to the right side.  CLINICAL IMPRESSION:  1. Right L5-S1 herniated disk with free fragment.  2. Degenerative disk disease.  PLAN: I talk with the patient and her husband at length and we discussed two choices.  One would be to continue conservative treatment and the other surgery.  The patient feels she cannot stand the pain she is having and wants to proceed with surgery.  The surgery was fully explained including the associated risks such as infection, CSF leak, recurrence, and also the risks associated with her heart disease.  I was able to call the cardiologist who saw her and cleared her for surgery.  She will be in a telemetry bed postoperatively. DD:  06/30/00 TD:  07/01/00 Job: [id number]

## 2010-09-04 NOTE — Cardiovascular Report (Signed)
[place]. [facility]  Patient:    Madison, [name]                       MRN: [id number] Proc. Date: 03/28/00 Adm. Date:  [id number] Attending:  Pricilla Riffle CC:         [facility]  Dietrich Pates, M.D.   Cardiac Catheterization  CLINICAL HISTORY:  Madison [name] is 65 years old and has no prior history of known heart disease, although she does have a positive family history and does smoke and has elevated lipids.  She had the onset of chest pain at 8:30 on March 27, 2000 but did not seek assistance until this morning when she went to [facility] with chest pain and ECG changes suggestive of an inferior infarction.  She was transferred to [facility] and seen by Dr. Dietrich Pates.  Initially she was to be stabilized with medical therapy, but because of recurrent pain, she was taken urgently to catheterization laboratory.  DESCRIPTION OF PROCEDURE:  The procedure was performed via the right femoral artery using arterial sheath and 6-French preformed coronary catheters.  A femoral arterial puncture was performed and Omnipaque contrast was used. After completion of th diagnostic study.  We made a decision to proceed with intervention of the right coronary artery.  The patient was given weight-adjusted heparin for an ACT greater than 200 seconds.  When we gave the heparin, we were under the impression that the patient had been given unfractionated heparin in the emergency department, but we later learned she had received subcutaneous Lovenox.  The patient had also been started on an Aggrastat drip in the emergency room.  We used a 6-French JR-4 guiding catheter and we tried to cross first with a Trooper wire and then a Graphics PT wire and were finally able to cross with an ACS Whisper wire.  We then dilated with a 2.5 x 20 mm Maverick, performing a total of five inflations in the proximal and mid right coronary artery up to 6  atmospheres for 25 seconds.  We then deployed a 3.0 x 31 NIR Elite performing two inflations of 10 and 14 atmospheres at 37 and 38 seconds.  We avoided the distal edge with the higher pressure inflations.  We then went back in with a 3.5 x 15 mm Quantum Monorail and dilated in the proxmial and mid portion of the stent with three inflations up to 15 atmosphere for 36 seconds. Repeat diagnostic study was then performed with the guiding catheter. The patient tolerated the procedure well and left the laboratory in satisfactory condition.  RESULTS: 1. LEFT MAIN CORONARY ARTERY:  The left main coronary artery was free of    significant disease.  2. LEFT ANTERIOR DESCENDING ARTERY:  The left anterior descending gave rise    to a large septal perforator and a moderate sized diagonal branch.  There    was 70% narrowing in the LAD, just after the septal perforator and there    was 50% narrowing in the mid LAD.  3. CIRCUMFLEX ARTERY:  The circumflex artery gave rise to an intermediate    branch, a marginal branch and a posterolateral branch.  There was 30%    narrowing in the mid vessel.  4. RIGHT CORONARY ARTERY:  The right coronary artery was completely occluded    proximally.  5. LEFT CORONARY SYSTEM:  There were collaterals from the left coronary    system.  LEFT VENTRICULOGRAM:  The left ventriculogram was performed in the RAO projection showed hyperkinesis of the inferior wall.  The apex and and the anterolateral wall move well.  The estimated ejection fraction was 55%.  Following stenting of the proximal right coronary artery occlusion, the stenosis improved from 100% to 0% and the flow improved from TIMI-0 to TIMI-III flow.  COMMENTS:  The patient had the onset of chest pain at 2030 on March 27, 2000. she arrived in our emergency room at 9:53 on March 28, 2000.  Initial medical stabilization was planned, but because of recurrent symptoms, she was taken to the cardiac cath lab  and the balloon lab time was 1553.  This gave a total balloon time of 6 hours and reperfusion time of 19 hours and 23 minutes.   DISPOSITION:  The patient PACU for further observation.  She will classified as low risk and targeted to go home on Tuesday, but of our concern about a hematoma and need to stop the Aggrastat early.  I think the lesions on the left side are not tight enough to warrant intervention at this time. DD:  03/28/00 TD:  03/28/00 Job: [id number]

## 2010-09-04 NOTE — Op Note (Signed)
[place]. [facility]  Patient:    CORY, RAMA                       MRN: [id number] Proc. Date: 06/30/00 Adm. Date:  [id number] Disc. Date: [id number] Attending:  Dietrich Pates V                           Operative Report  PREOPERATIVE DIAGNOSIS:  Right L5-S1 herniated disk with free fragment.  POSTOPERATIVE DIAGNOSIS:  Right L5-S1 herniated disk with a large free fragment affecting S1 and S2 nerve roots.  PROCEDURE:  Right L5-S1 diskectomy with removal of large free fragment. Microscope.  SURGEON:  Tanya Nones. Jeral Fruit, M.D.  CLINICAL HISTORY:  Ms. Rena is a lady who was seen in my office around noon time because of sudden onset of back pain radiating down to the right leg. This lady has been having pain for seven days and despite strong pain medication plus cortisone, she is not any better.  MRI was obtained and showed that she has a free fragment affecting the S1 and S2 nerve root.  The patient has a cardiac history.  The amount of pain that she was having kept her from standing.  When I saw her in my office, she was lying on the floor.  Surgery was advised.  She and her husband knew of the risks, such as infection, CSF leak, worsening of the pain, need for further surgery, and also the risk of surgery to the heart.  She was seen prior to surgery by the cardiologist.  DESCRIPTION OF PROCEDURE:  The patient was taken to the OR, where she was positioned in a prone manner.  A midline incision from L5 to S1 was made. Muscle and fascia were retracted laterally.  We identified the L5-S1 interspace by x-ray.  With the drill, we drilled the lower lamina of L5, the upper of 5-1.  We brought the microscope, and we started with a microdissection, removing the yellow ligament.  Immediately we found that the S1 nerve root was pushed away and posterior.  There were three fragments, two being at the level of the axilla of the S1 nerve root and the other one  going below, affecting the S2 nerve root.  Removal was done.  We found an opening in the disk space, and we entered the disk.  Total gross diskectomy was achieved. At the end, we had a good decompression.  The L5, S1, and S2 nerve root were normal.  From then on, the area was irrigated.  Fentanyl and Depo-Medrol were left in the epidural space.  A piece of fat also was left to cover the surgical defect.  From then on, the wound was closed with Vicryl and a Steri-Strip. DD:  06/30/00 TD:  07/01/00 Job: [id number]

## 2010-09-04 NOTE — Discharge Summary (Signed)
[facility]  Patient:    Madison [name], Madison [name]                       MRN: [id number] Adm. Date:  [id number] Disc. Date: 03/31/00 Attending:  Pricilla Riffle Dictator:   Joellyn Rued, P.A.C. CC:         [facility]   Referring Physician Discharge [place]  DATE OF BIRTH:  03-24-1946  ADMITTING PHYSICIAN:  Dietrich Pates, M.D.  SUMMARY OF HISTORY:  Ms. Scheff is a 65 year old white female who developed substernal chest discomfort which she described as a pressure on the evening prior to admission, associated with shortness of breath.  The discomfort would decrease with burping.  Over the preceding week, she has noticed intermittent arm discomfort exacerbated by exertion and associated with shortness of breath.  The evening prior to admission, her substernal chest pressure increased while at rest.  She did obtain some relief with ibuprofen and felt that it was exacerbated by movement.  She has a history of tobacco use and hyperlipidemia.  LABORATORY DATA:  TSH 3.190.  Initial CK was elevated at 573 with an MB of 102.1, relative index 17.8, troponin 4.45.  Her second CK was 706 with an MB of 111.5 and a relative index of 15.8, troponin 18.3.  Subsequent enzymes and troponins were declining.  Admission sodium was 128, potassium 3.3, BUN 10, creatinine 0.6, glucose 145.  AST was slightly elevated at 47.  H&H was 12.8 and 36.4, normal indices, platelets 272, wbcs 14.7.  Prior to her discharge, her sodium was 137 on December 11, and her potassium was 4.1.  Chest x-ray did not show any active disease.  EKG showed normal sinus rhythm, inferior T wave inversion with small Q waves. Subsequent EKGs showed biphasic T waves in III and aVF.  Echocardiogram performed on March 29, 2000 showed an EF of 55-65%, mild basilar mid inferior hypokinesis, mild TR, mild MR.  HOSPITAL COURSE:  Ms. Dozier was taken to the catheterization laboratory by Dr.  Juanda Chance.  According to his progress notes, catheterization showed 100% proximal RCA, 70% proximal LAD, 50% mid LAD, 30% proximal circumflex, EF 55% with inferior hypokinesis.  Angioplasty stenting was performed to the proximal RCA, reducing this to 0%.  He noted that she received Lovenox in the emergency room and was given ______ to prolong her ACT greater than 200.  She did develop a hematoma at the sheath site and her Aggrastat was discontinued. Post catheterization and sheath removed, catheterization site showed moderate amount of ecchymosis without any bruit.  Dopplers did not show any evidence of fistula or pseudoaneurysm.  She was ambulating in the hall with cardiac rehab and received teaching prior to her discharge.  Her medications were adjusted. Due to her continued tobacco use, she was started on Wellbutrin.  By December 13, it was felt that she was stable for discharge.  DISCHARGE DIAGNOSES: 1. Inferior myocardial infarction, late presentation. 2. Status post angioplasty stenting to the right coronary artery. 3. Residual coronary artery disease as described in her catheterization    report. 4. Right groin hematoma. 5. History of hyperlipidemia and tobacco use.  DISPOSITION:  She is discharged home.  NEW MEDICATIONS: 1. Plavix 75 mg q.d. for four weeks. 2. Wellbutrin 150 mg b.i.d. for seven weeks. 3. Sublingual nitroglycerin as needed. 4. Her Lopressor dose is yet to be determined by Dr. Juanda Chance.  OTHER MEDICATIONS:  She is asked to  resume: 1. Lipitor 10 mg q.h.s. 2. Prempro 0.625/2.5 q.d. 3. Aspirin 325 q.d. 4. Niaspan 750 two tablets q.h.s.  ACTIVITY:  She is advised no lifting, driving, sexual activity, or heavy exertion until seen by the physician.  DIET:  Maintain low salt/fat/cholesterol diet.  SPECIAL INSTRUCTIONS:  If she had any problems with her catheterization site she was asked to call immediately.  She was advised no smoking or tobacco products, to bring  all her medications to the office.  FOLLOW-UP:  She will see Joellyn Rued, P.A., in the office on April 07, 2000 at 12 p.m. and follow up with Dr. Tenny Craw in a couple of months. DD:  03/31/00 TD:  03/31/00 Job: [id number]

## 2010-09-04 NOTE — Assessment & Plan Note (Signed)
[facility]                            CARDIOLOGY OFFICE NOTE   NAME:Madison [name], Madison [name]                       MRN:          [id number]  DATE:06/22/2006                            DOB:          1945/06/04    PRIMARY:  Dr. Molly Maduro Day.   REASON FOR PRESENTATION:  Evaluate the patient with coronary disease.   HISTORY OF PRESENT ILLNESS:  The patient is a 65 year old white female  with a history as described below.  She has had no new cardiovascular  symptoms since I last saw her a little over a year ago.  She does walk  for exercise when the weather allows.  She says with this she denies any  of the chest and arm discomfort that she had previously.  She has no  neck discomfort.  She has no shortness of breath, PND, or orthopnea.  She has no palpitations, pre-syncope, or syncope.  She, unfortunately,  continues to smoke cigarettes.   PAST MEDICAL HISTORY:  Coronary artery disease (occluded right coronary  artery in 2001, 70% LAD stenosis followed by 50% stenosis, 30% proximal  circumflex stenosis, EF 55%.  The patient had percutaneous transluminal  coronary angioplasty and stenting of the right coronary artery),  hypertension, dyslipidemia with a low HDL and elevated triglycerides,  tobacco use, osteoporosis, follicular cell thyroid cancer.   ALLERGIES:  WELLBUTRIN.   CURRENT MEDICATIONS:  1. Metoprolol 25 mg b.i.d.  2. Fosamax 75 mg weekly.  3. Aspirin 81 mg daily.  4. Avacor 2000 mg nightly.  5. Synthroid.  6. Multivitamin.  7. Diovan 80 mg daily.   REVIEW OF SYSTEMS:  As stated in the HPI and otherwise negative for  other systems.   PHYSICAL EXAMINATION:  The patient is in no distress.  Blood pressure 162/102, heart rate 61 and irregular.  HEENT:  Eyelids unremarkable.  Pupils equal, round, and reactive to  light.  Fundi not visualized.  Oral mucosa unremarkable.  NECK:  No jugular venous distension.  Wave form within normal limits,  carotid  upstroke brisk and symmetric.  No bruits.  No thyromegaly.  LYMPHATICS:  No cervical, axillary, or inguinal adenopathy.  LUNGS:  Clear to auscultation bilaterally.  BACK:  No costovertebral angle tenderness.  CHEST:  Unremarkable.  HEART:  PMI not displaced or sustained, S1 and S2 within normal limits,  no S3, no S4, no murmurs.  No clicks, rubs, or murmurs.  ABDOMEN:  Flat, positive bowel sounds, normal in frequency and pitch, no  bruits, rebound, guarding.  No midline pulsatile masses, hepatomegaly,  splenomegaly.  SKIN:  No rashes, no nodules.  EXTREMITIES:  With 2+ pulses throughout, no edema, cyanosis, clubbing.  NEURO:  Oriented to person, place, and time, cranial nerves 2-12 grossly  intact, motor grossly intact throughout.   ASSESSMENT AND PLAN:  1. Coronary artery disease.  Unfortunately, the patient is not      participating aggressively in secondary risk reduction.  She      continues to smoke cigarettes.  She is not exercising as much as I      would like.  She has not lost weight.  She is not having any new      symptoms and does not need any further cardiovascular testing.      However, we discussed at length the need for the above.  2. Dyslipidemia.  Her HDL remains low and her LDL slightly above      target.  She is on a good medical regimen and did not tolerate fish      oil or [place].  At this point, stopping smoking, and exercising,      would be the preferable to bring this in line.  We discussed      Chantix and she is going to discuss this further with Dr. Morrie Sheldon.  We      discussed exercise.  Hopefully, she can comply with these going      forward.  3. Smoking cessation as above.  4. Followup.  Will see her back in 1 year or sooner.     Rollene Rotunda, MD, [facility]  Electronically Signed    JH/MedQ  DD: 06/22/2006  DT: 06/22/2006  Job #: [id number]   cc:   Alfredia Client, MD

## 2010-11-23 ENCOUNTER — Other Ambulatory Visit: Payer: Self-pay | Admitting: Family Medicine

## 2010-11-23 DIAGNOSIS — Z1231 Encounter for screening mammogram for malignant neoplasm of breast: Secondary | ICD-10-CM

## 2010-12-07 ENCOUNTER — Ambulatory Visit
Admission: RE | Admit: 2010-12-07 | Discharge: 2010-12-07 | Disposition: A | Discharge status: Home / Self Care | Payer: BC Managed Care – PPO | Source: Ambulatory Visit | Attending: Family Medicine | Admitting: Family Medicine

## 2010-12-07 DIAGNOSIS — Z1231 Encounter for screening mammogram for malignant neoplasm of breast: Secondary | ICD-10-CM

## 2010-12-15 ENCOUNTER — Encounter: Payer: Self-pay | Admitting: Cardiology

## 2010-12-16 ENCOUNTER — Encounter: Payer: Self-pay | Admitting: Cardiology

## 2010-12-16 ENCOUNTER — Ambulatory Visit (INDEPENDENT_AMBULATORY_CARE_PROVIDER_SITE_OTHER): Payer: BC Managed Care – PPO | Admitting: Cardiology

## 2010-12-16 DIAGNOSIS — I10 Essential (primary) hypertension: Secondary | ICD-10-CM

## 2010-12-16 DIAGNOSIS — E785 Hyperlipidemia, unspecified: Secondary | ICD-10-CM

## 2010-12-16 DIAGNOSIS — I2510 Atherosclerotic heart disease of native coronary artery without angina pectoris: Secondary | ICD-10-CM

## 2010-12-16 DIAGNOSIS — F17200 Nicotine dependence, unspecified, uncomplicated: Secondary | ICD-10-CM

## 2010-12-16 MED ORDER — NITROGLYCERIN 0.4 MG SL SUBL: 0.4000 mg | SUBLINGUAL_TABLET | SUBLINGUAL | Status: DC | PRN

## 2010-12-16 MED ORDER — METOPROLOL TARTRATE 50 MG PO TABS: 50.0000 mg | ORAL_TABLET | Freq: Two times a day (BID) | ORAL | Status: DC

## 2010-12-16 NOTE — Assessment & Plan Note (Signed)
I reviewed her lipids and were well-controlled. No change in therapy is indicated.

## 2010-12-16 NOTE — Assessment & Plan Note (Signed)
The blood pressure is at target. No change in medications is indicated. We will continue with therapeutic lifestyle changes (TLC).  

## 2010-12-16 NOTE — Progress Notes (Signed)
HPI The patient returns for followup. Since I last saw him he has done well.  The patient denies any new symptoms such as chest discomfort, neck or arm discomfort. There has been no new shortness of breath, PND or orthopnea. There have been no reported palpitations, presyncope or syncope.  Unfortunately she continues to smoke. She is limited in her activities by ankle pain related to previous fracture.   Allergies  Allergen Reactions  . Bupropion Hcl     REACTION: paranoia    Current Outpatient Prescriptions  Medication Sig Dispense Refill  . aspirin 325 MG tablet Take 325 mg by mouth daily.        . Diphenhydramine-APAP, sleep, (TYLENOL PM EXTRA STRENGTH PO) Take by mouth as needed.        . ergocalciferol (VITAMIN D2) 50000 UNITS capsule Take 50,000 Units by mouth once a week.        . levothyroxine (SYNTHROID, LEVOTHROID) 137 MCG tablet Take 137 mcg by mouth as directed. Take one tablet by mouth daily then take two tablets by mouth on Sunday .       . metoprolol (LOPRESSOR) 50 MG tablet Take 50 mg by mouth. Take half tablet two times a day       . niacin (NIASPAN) 1000 MG CR tablet Take 1,000 mg by mouth at bedtime.        . Omega-3 Fatty Acids (FISH OIL PO) Take by mouth daily.        . rosuvastatin (CRESTOR) 20 MG tablet Take 20 mg by mouth daily.        . valsartan (DIOVAN) 160 MG tablet Take 160 mg by mouth daily.          Past Medical History  Diagnosis Date  . Hypertension   . Hyperlipidemia   . Osteoporosis   . Thyroid cancer 2004    Dr. Demaris Callander   . ASCVD (arteriosclerotic cardiovascular disease) 2002  . Heart attack 2002  . Diverticulosis 12/2007  . IBS (irritable bowel syndrome)   . Tobacco abuse   . Post-menopausal   . Need for prophylactic hormone replacement therapy (postmenopausal)   . CAD (coronary artery disease)   . History of cholecystectomy 08/14/02  . Thyroid nodule 2004  . Osteopenia     Past Surgical History  Procedure Date  . Appendectomy 2002     ROS:  As stated in the HPI and negative for all other systems.  PHYSICAL EXAM BP 132/88  Pulse 72  Resp 16  Ht 5\' 5"  (1.651 m)  Wt 176 lb (79.833 kg)  BMI 29.29 kg/m2 GENERAL:  Well appearing HEENT:  Pupils equal round and reactive, fundi not visualized, oral mucosa unremarkable NECK:  No jugular venous distention, waveform within normal limits, carotid upstroke brisk and symmetric, no bruits, no thyromegaly LYMPHATICS:  No cervical, inguinal adenopathy LUNGS:  Clear to auscultation bilaterally BACK:  No CVA tenderness CHEST:  Unremarkable HEART:  PMI not displaced or sustained,S1 and S2 within normal limits, no S3, no S4, no clicks, no rubs, no murmurs ABD:  Flat, positive bowel sounds normal in frequency in pitch, no bruits, no rebound, no guarding, no midline pulsatile mass, no hepatomegaly, no splenomegaly EXT:  2 plus pulses throughout, no edema, no cyanosis no clubbing SKIN:  No rashes no nodules NEURO:  Cranial nerves II through XII grossly intact, motor grossly intact throughout PSYCH:  Cognitively intact, oriented to person place and time  EKG:  Sinus rhythm, rate 72, axis within normal limits,  intervals within normal limits, no acute ST-T wave changes.   ASSESSMENT AND PLAN

## 2010-12-16 NOTE — Assessment & Plan Note (Signed)
It has been 5 years since a screening stress test.  Because of ankle pain she wouldn't be able to walk on a treadmill.  She will have a [employer].

## 2010-12-16 NOTE — Assessment & Plan Note (Signed)
She has tried both smoking and she cannot do it. We discussed the importance of this again today. I do appreciate her efforts.

## 2010-12-16 NOTE — Patient Instructions (Addendum)
Your physician has requested that you have a lexiscan myoview. For further information please visit [email or web address]. Please follow instruction sheet, as given.  The current medical regimen is effective;  continue present plan and medications.  

## 2010-12-28 ENCOUNTER — Encounter: Payer: Self-pay | Admitting: *Deleted

## 2011-01-04 ENCOUNTER — Other Ambulatory Visit (HOSPITAL_COMMUNITY): Payer: BC Managed Care – PPO | Admitting: Radiology

## 2011-02-22 ENCOUNTER — Encounter: Payer: Self-pay | Admitting: Cardiology

## 2011-06-10 ENCOUNTER — Other Ambulatory Visit: Payer: Self-pay | Admitting: Family Medicine

## 2011-06-10 DIAGNOSIS — R05 Cough: Secondary | ICD-10-CM

## 2011-06-10 DIAGNOSIS — R053 Chronic cough: Secondary | ICD-10-CM

## 2011-06-11 ENCOUNTER — Encounter: Payer: Self-pay | Admitting: Cardiology

## 2011-06-16 ENCOUNTER — Ambulatory Visit
Admission: RE | Admit: 2011-06-16 | Discharge: 2011-06-16 | Disposition: A | Discharge status: Home / Self Care | Payer: BC Managed Care – PPO | Source: Ambulatory Visit | Attending: Family Medicine | Admitting: Family Medicine

## 2011-06-16 DIAGNOSIS — R05 Cough: Secondary | ICD-10-CM

## 2011-06-16 DIAGNOSIS — R053 Chronic cough: Secondary | ICD-10-CM

## 2011-11-29 ENCOUNTER — Other Ambulatory Visit: Payer: Self-pay | Admitting: Family Medicine

## 2011-11-29 DIAGNOSIS — Z1231 Encounter for screening mammogram for malignant neoplasm of breast: Secondary | ICD-10-CM

## 2011-12-14 ENCOUNTER — Ambulatory Visit
Admission: RE | Admit: 2011-12-14 | Discharge: 2011-12-14 | Disposition: A | Discharge status: Home / Self Care | Payer: Medicare Other | Source: Ambulatory Visit | Attending: Family Medicine | Admitting: Family Medicine

## 2011-12-14 DIAGNOSIS — Z1231 Encounter for screening mammogram for malignant neoplasm of breast: Secondary | ICD-10-CM

## 2012-03-15 ENCOUNTER — Encounter: Payer: Self-pay | Admitting: Cardiology

## 2012-03-15 ENCOUNTER — Ambulatory Visit (INDEPENDENT_AMBULATORY_CARE_PROVIDER_SITE_OTHER): Payer: Medicare Other | Admitting: Cardiology

## 2012-03-15 ENCOUNTER — Ambulatory Visit: Payer: Medicare Other | Admitting: Cardiology

## 2012-03-15 VITALS — BP 150/86 | HR 65 | Ht 65.0 in | Wt 173.0 lb

## 2012-03-15 DIAGNOSIS — I2510 Atherosclerotic heart disease of native coronary artery without angina pectoris: Secondary | ICD-10-CM

## 2012-03-15 DIAGNOSIS — I10 Essential (primary) hypertension: Secondary | ICD-10-CM

## 2012-03-15 DIAGNOSIS — F17200 Nicotine dependence, unspecified, uncomplicated: Secondary | ICD-10-CM

## 2012-03-15 NOTE — Patient Instructions (Addendum)
The current medical regimen is effective;  continue present plan and medications.  Your physician has requested that you have a lexiscan myoview. For further information please visit [email or web address]. Please follow instruction sheet, as given.  Follow up in 1 year with Dr Hochrein.  You will receive a letter in the mail 2 months before you are due.  Please call us when you receive this letter to schedule your follow up appointment.  

## 2012-03-15 NOTE — Progress Notes (Signed)
HPI The patient returns for followup. Last year and wanted to do a stress test as it had been many years since her last stress test and 11 years since her angioplasty. However, she canceled this because her husband was having vision problems. She presents for follow up.  She has no new cardiovascular symptoms. However, she doesn't exercise and she is continuing to smoke. She does do some yard without bringing on any symptoms The patient denies any new symptoms such as chest discomfort, neck or arm discomfort. There has been no new shortness of breath, PND or orthopnea. There have been no reported palpitations, presyncope or syncope.   Allergies  Allergen Reactions  . Bupropion Hcl     REACTION: paranoia    Current Outpatient Prescriptions  Medication Sig Dispense Refill  . aspirin 325 MG tablet Take 325 mg by mouth daily.        . Diphenhydramine-APAP, sleep, (TYLENOL PM EXTRA STRENGTH PO) Take by mouth as needed.        . ergocalciferol (VITAMIN D2) 50000 UNITS capsule Take 50,000 Units by mouth once a week.        . levothyroxine (SYNTHROID, LEVOTHROID) 137 MCG tablet Take 137 mcg by mouth as directed. Take one tablet by mouth daily then take two tablets by mouth on Sunday .       . metoprolol (LOPRESSOR) 50 MG tablet Take half tablet two times a day      . niacin (NIASPAN) 1000 MG CR tablet Take 1,000 mg by mouth at bedtime.        . nitroGLYCERIN (NITROSTAT) 0.4 MG SL tablet Place 0.4 mg under the tongue every 5 (five) minutes as needed.      . rosuvastatin (CRESTOR) 20 MG tablet Take 20 mg by mouth daily.        . valsartan (DIOVAN) 160 MG tablet Take 160 mg by mouth daily.        . [DISCONTINUED] metoprolol (LOPRESSOR) 50 MG tablet Take 1 tablet (50 mg total) by mouth 2 (two) times daily. Take half tablet two times a day  90 tablet  3  . nitroGLYCERIN (NITROSTAT) 0.4 MG SL tablet Place 1 tablet (0.4 mg total) under the tongue every 5 (five) minutes as needed for chest pain.  90 tablet   12    Past Medical History  Diagnosis Date  . Hypertension   . Hyperlipidemia   . Osteoporosis   . Thyroid cancer 2004    Dr. Demaris Callander   . Diverticulosis   . IBS (irritable bowel syndrome)   . Tobacco abuse   . Need for prophylactic hormone replacement therapy (postmenopausal)   . CAD (coronary artery disease)     PCI of an occluded right coronary artery 2001. 70% LAD stenosis, 30% circumflex stenosis.  . Thyroid nodule 2004  . Osteopenia     Past Surgical History  Procedure Date  . Appendectomy 2002  . Cholecystectomy     ROS:  As stated in the HPI and negative for all other systems.  PHYSICAL EXAM BP 150/86  Pulse 65  Ht 5\' 5"  (1.651 m)  Wt 173 lb (78.472 kg)  BMI 28.79 kg/m2 GENERAL:  Well appearing HEENT:  Pupils equal round and reactive, fundi not visualized, oral mucosa unremarkable NECK:  No jugular venous distention, waveform within normal limits, carotid upstroke brisk and symmetric, no bruits, no thyromegaly LYMPHATICS:  No cervical, inguinal adenopathy LUNGS:  Clear to auscultation bilaterally BACK:  No CVA tenderness CHEST:  Unremarkable HEART:  PMI not displaced or sustained,S1 and S2 within normal limits, no S3, no S4, no clicks, no rubs, no murmurs ABD:  Flat, positive bowel sounds normal in frequency in pitch, no bruits, no rebound, no guarding, no midline pulsatile mass, no hepatomegaly, no splenomegaly EXT:  2 plus pulses throughout, no edema, no cyanosis no clubbing SKIN:  No rashes no nodules NEURO:  Cranial nerves II through XII grossly intact, motor grossly intact throughout PSYCH:  Cognitively intact, oriented to person place and time  EKG:  Sinus rhythm, rate 65, axis within normal limits, intervals within normal limits, no acute ST-T wave changes.  03/15/2012   ASSESSMENT AND PLAN  CORONARY ARTERY DISEASE -  It has been 6 years since a screening stress test. She had previous nonobstructive coronary disease with a 70% LAD stenosis. She  needs to be screened with stress perfusion imaging.   TOBACCO ABUSE -  She has tried both smoking and she cannot do it. he has failed Chantix well he in the nicotine patches. We again discussed this.   DYSLIPIDEMIA -  This is followed by Dr. Christell Constant.    HTN - Her BP at home is normal.  She will continue the current meds.

## 2012-04-28 ENCOUNTER — Encounter: Payer: Self-pay | Admitting: Cardiology

## 2012-05-04 ENCOUNTER — Ambulatory Visit (HOSPITAL_COMMUNITY): Payer: Medicare Other | Attending: Cardiology | Admitting: Radiology

## 2012-05-04 VITALS — Ht 65.0 in | Wt 172.0 lb

## 2012-05-04 DIAGNOSIS — I2510 Atherosclerotic heart disease of native coronary artery without angina pectoris: Secondary | ICD-10-CM

## 2012-05-04 DIAGNOSIS — R0989 Other specified symptoms and signs involving the circulatory and respiratory systems: Secondary | ICD-10-CM | POA: Insufficient documentation

## 2012-05-04 DIAGNOSIS — F17200 Nicotine dependence, unspecified, uncomplicated: Secondary | ICD-10-CM | POA: Insufficient documentation

## 2012-05-04 DIAGNOSIS — J45909 Unspecified asthma, uncomplicated: Secondary | ICD-10-CM | POA: Insufficient documentation

## 2012-05-04 DIAGNOSIS — I10 Essential (primary) hypertension: Secondary | ICD-10-CM | POA: Insufficient documentation

## 2012-05-04 DIAGNOSIS — R0609 Other forms of dyspnea: Secondary | ICD-10-CM | POA: Insufficient documentation

## 2012-05-04 DIAGNOSIS — R0602 Shortness of breath: Secondary | ICD-10-CM

## 2012-05-04 DIAGNOSIS — R000 Tachycardia, unspecified: Secondary | ICD-10-CM | POA: Insufficient documentation

## 2012-05-04 MED ORDER — TECHNETIUM TC 99M SESTAMIBI GENERIC - CARDIOLITE
30.0000 | Freq: Once | INTRAVENOUS | Status: AC | PRN
  Administered 2012-05-04: 30 via INTRAVENOUS

## 2012-05-04 MED ORDER — TECHNETIUM TC 99M SESTAMIBI GENERIC - CARDIOLITE
10.0000 | Freq: Once | INTRAVENOUS | Status: AC | PRN
  Administered 2012-05-04: 10 via INTRAVENOUS

## 2012-05-04 NOTE — Progress Notes (Signed)
[facility] SITE 3 NUCLEAR MED [address] [place], [place] [address] [phone]    Cardiology Nuclear Med Study  Madison [name] is a 67 y.o. female     MRN : [id number]     DOB: Feb 23, 1946  Procedure Date: 05/04/2012  Nuclear Med Background Indication for Stress Test:  Evaluation for Ischemia and Stent Patency History: Asthma, '01 IWMI>Cath> Stent RCA with residual 70% LAD,EF=55%,12/06 Myocardial Perfusion Study-Normal, EF=73%, and 05-2011 Cardiac CT: artery calcification Cardiac Risk Factors: Family History - CAD, Hypertension, Lipids and Smoker  Symptoms:  DOE and Rapid HR   Nuclear Pre-Procedure Caffeine/Decaff Intake:  None NPO After: 9:00pm   Lungs:  clear O2 Sat: 99% on room air. IV 0.9% NS with Angio Cath:  22g  IV Site: R Hand  IV Started by:  Bonnita Levan, RN  Chest Size (in):  44 Cup Size: C  Height: 5\' 5"  (1.651 m)  Weight:  172 lb (78.019 kg)  BMI:  Body mass index is 28.62 kg/(m^2). Tech Comments:  Patient took Metoprolol yesterday at 5pm. Losartan 50 mg last night.    Nuclear Med Study 1 or 2 day study: 1 day  Stress Test Type:  Stress  Reading MD: Marca Ancona, MD  Order Authorizing Provider:  Rollene Rotunda, MD  Resting Radionuclide: Technetium 43m Sestamibi  Resting Radionuclide Dose: 11.0 mCi   Stress Radionuclide:  Technetium 42m Sestamibi  Stress Radionuclide Dose: 33.0 mCi           Stress Protocol Rest HR: 77 Stress HR: 144  Rest BP: 156/95 Stress BP: 224/94  Exercise Time (min): 6:05 METS: 7.0   Predicted Max HR: 155 bpm % Max HR: 92.9 bpm Rate Pressure Product: 21308    Dose of Adenosine (mg):  n/a Dose of Lexiscan: n/a mg  Dose of Atropine (mg): n/a Dose of Dobutamine: n/a mcg/kg/min (at max HR)  Stress Test Technologist: Irean Hong, RN  Nuclear Technologist:  Domenic Polite, CNMT     Rest Procedure:  Myocardial perfusion imaging was performed at rest 45 minutes following the intravenous administration of Technetium 33m  Sestamibi. Rest ECG: NSR - Normal EKG  Stress Procedure:  The patient exercised on the treadmill utilizing the Bruce Protocol for 6:05 minutes, RPE=15. The patient stopped due to DOE and denied any chest pain.  There was a hypertensive response to exercise.Technetium 68m Sestamibi was injected at peak exercise and myocardial perfusion imaging was performed after a brief delay. Stress ECG: No significant change from baseline ECG  QPS Raw Data Images:  Normal; no motion artifact; normal heart/lung ratio. Stress Images:  Normal homogeneous uptake in all areas of the myocardium. Rest Images:  Normal homogeneous uptake in all areas of the myocardium. Subtraction (SDS):  There is no evidence of scar or ischemia. Transient Ischemic Dilatation (Normal <1.22):  0.89 Lung/Heart Ratio (Normal <0.45):  0.41  Quantitative Gated Spect Images QGS EDV:  42 ml QGS ESV:  11 ml  Impression Exercise Capacity:  Fair exercise capacity. BP Response:  Hypertensive blood pressure response. Clinical Symptoms:  dyspnea, no chest pain.  ECG Impression:  No significant ST segment change suggestive of ischemia. Comparison with Prior Nuclear Study: No images to compare  Overall Impression:  Normal stress nuclear study.  LV Ejection Fraction: 73%.  LV Wall Motion:  NL LV Function; NL Wall Motion  Marca Ancona 05/04/2012

## 2012-05-08 NOTE — Addendum Note (Signed)
Addended by: Reine Just on: 05/08/2012 02:22 PM   Modules accepted: Orders

## 2012-05-24 ENCOUNTER — Encounter: Payer: Self-pay | Admitting: Cardiology

## 2012-06-30 ENCOUNTER — Other Ambulatory Visit: Payer: Self-pay | Admitting: *Deleted

## 2012-06-30 DIAGNOSIS — M810 Age-related osteoporosis without current pathological fracture: Secondary | ICD-10-CM

## 2012-07-06 ENCOUNTER — Other Ambulatory Visit (INDEPENDENT_AMBULATORY_CARE_PROVIDER_SITE_OTHER): Payer: Medicare Other

## 2012-07-06 DIAGNOSIS — D72828 Other elevated white blood cell count: Secondary | ICD-10-CM

## 2012-07-06 DIAGNOSIS — Z79899 Other long term (current) drug therapy: Secondary | ICD-10-CM

## 2012-07-06 DIAGNOSIS — R5381 Other malaise: Secondary | ICD-10-CM

## 2012-07-06 LAB — POCT CBC
Granulocyte percent: 65.2 %G (ref 37–80)
HCT, POC: 47 % (ref 37.7–47.9)
Hemoglobin: 16.3 g/dL — AB (ref 12.2–16.2)
Lymph, poc: 4.3 — AB (ref 0.6–3.4)
MCH, POC: 31.8 pg — AB (ref 27–31.2)
MCHC: 34.6 g/dL (ref 31.8–35.4)
MCV: 92 fL (ref 80–97)
MPV: 9.2 fL (ref 0–99.8)
POC Granulocyte: 9.4 — AB (ref 2–6.9)
POC LYMPH PERCENT: 29.6 %L (ref 10–50)
Platelet Count, POC: 179 10*3/uL (ref 142–424)
RBC: 5.1 M/uL (ref 4.04–5.48)
RDW, POC: 12.9 %
WBC: 14.4 10*3/uL — AB (ref 4.6–10.2)

## 2012-07-06 LAB — HEPATIC FUNCTION PANEL
ALT: 21 U/L (ref 0–35)
AST: 16 U/L (ref 0–37)
Albumin: 4.6 g/dL (ref 3.5–5.2)
Alkaline Phosphatase: 72 U/L (ref 39–117)
Bilirubin, Direct: 0.1 mg/dL (ref 0.0–0.3)
Indirect Bilirubin: 0.2 mg/dL (ref 0.0–0.9)
Total Bilirubin: 0.3 mg/dL (ref 0.3–1.2)
Total Protein: 7.2 g/dL (ref 6.0–8.3)

## 2012-07-06 NOTE — Progress Notes (Signed)
Patient came in for labs only.

## 2012-07-12 NOTE — Addendum Note (Signed)
Addended by: Bearl Mulberry on: 07/12/2012 07:10 PM   Modules accepted: Orders

## 2012-07-24 ENCOUNTER — Encounter (HOSPITAL_COMMUNITY): Payer: Medicare Other | Attending: Oncology | Admitting: Oncology

## 2012-07-24 ENCOUNTER — Encounter (HOSPITAL_COMMUNITY): Payer: Self-pay | Admitting: Oncology

## 2012-07-24 VITALS — BP 167/76 | HR 74 | Temp 98.4°F | Resp 16 | Ht 64.5 in | Wt 169.0 lb

## 2012-07-24 DIAGNOSIS — D72829 Elevated white blood cell count, unspecified: Secondary | ICD-10-CM

## 2012-07-24 DIAGNOSIS — M810 Age-related osteoporosis without current pathological fracture: Secondary | ICD-10-CM

## 2012-07-24 DIAGNOSIS — D582 Other hemoglobinopathies: Secondary | ICD-10-CM

## 2012-07-24 DIAGNOSIS — F17200 Nicotine dependence, unspecified, uncomplicated: Secondary | ICD-10-CM | POA: Insufficient documentation

## 2012-07-24 DIAGNOSIS — I10 Essential (primary) hypertension: Secondary | ICD-10-CM | POA: Insufficient documentation

## 2012-07-24 DIAGNOSIS — I2510 Atherosclerotic heart disease of native coronary artery without angina pectoris: Secondary | ICD-10-CM | POA: Insufficient documentation

## 2012-07-24 HISTORY — DX: Elevated white blood cell count, unspecified: D72.829

## 2012-07-24 LAB — CBC WITH DIFFERENTIAL/PLATELET
Basophils Absolute: 0 10*3/uL (ref 0.0–0.1)
Basophils Relative: 0 % (ref 0–1)
Eosinophils Absolute: 0.5 10*3/uL (ref 0.0–0.7)
Eosinophils Relative: 4 % (ref 0–5)
HCT: 48.4 % — ABNORMAL HIGH (ref 36.0–46.0)
Hemoglobin: 16.6 g/dL — ABNORMAL HIGH (ref 12.0–15.0)
Lymphocytes Relative: 32 % (ref 12–46)
Lymphs Abs: 4.1 10*3/uL — ABNORMAL HIGH (ref 0.7–4.0)
MCH: 32 pg (ref 26.0–34.0)
MCHC: 34.3 g/dL (ref 30.0–36.0)
MCV: 93.4 fL (ref 78.0–100.0)
Monocytes Absolute: 0.6 10*3/uL (ref 0.1–1.0)
Monocytes Relative: 5 % (ref 3–12)
Neutro Abs: 7.7 10*3/uL (ref 1.7–7.7)
Neutrophils Relative %: 59 % (ref 43–77)
Platelets: 195 10*3/uL (ref 150–400)
RBC: 5.18 MIL/uL — ABNORMAL HIGH (ref 3.87–5.11)
RDW: 13 % (ref 11.5–15.5)
WBC: 12.9 10*3/uL — ABNORMAL HIGH (ref 4.0–10.5)

## 2012-07-24 LAB — COMPREHENSIVE METABOLIC PANEL
ALT: 21 U/L (ref 0–35)
AST: 18 U/L (ref 0–37)
Albumin: 4.2 g/dL (ref 3.5–5.2)
Alkaline Phosphatase: 79 U/L (ref 39–117)
BUN: 12 mg/dL (ref 6–23)
CO2: 21 mEq/L (ref 19–32)
Calcium: 9.4 mg/dL (ref 8.4–10.5)
Chloride: 103 mEq/L (ref 96–112)
Creatinine, Ser: 0.72 mg/dL (ref 0.50–1.10)
GFR calc Af Amer: >90 mL/min (ref >90)
GFR calc non Af Amer: 88 mL/min — ABNORMAL LOW (ref >90)
Glucose, Bld: 104 mg/dL — ABNORMAL HIGH (ref 70–99)
Potassium: 3.9 mEq/L (ref 3.5–5.1)
Sodium: 137 mEq/L (ref 135–145)
Total Bilirubin: 0.1 mg/dL — ABNORMAL LOW (ref 0.3–1.2)
Total Protein: 7.9 g/dL (ref 6.0–8.3)

## 2012-07-24 NOTE — Progress Notes (Signed)
[facility] NEW PATIENT EVALUATION   Name: Madison [name] Date: 07/24/2012 MRN: [id number] DOB: [birth year]    CC: Rudi Heap, MD     DIAGNOSIS: The encounter diagnosis was Leukocytosis, unspecified.   HISTORY OF PRESENT ILLNESS:Madison [name] is a 67 y.o. Caucasian female who is referred to the [facility] for Leukocytosis without a differential.  She has a past medical history significant for thyroid cancer, CAD, Dyslipidemia, GERD, HTN, IBS, Osteoporosis, and tobacco abuse.   The patient reports that this is the first she has heard anything about abnormal labs in her past.  This is the first she has heard about a Leukocytosis.    She feels well.  She denies any B symptoms including fevers, chills, night sweats, nausea, vomiting, unintentional weight loss, and early satiety.  She denies any abdominal pain.  She denies any headaches, dizziness, double vision, chest pain, heart palpitations, blood in stool, black tarry stool, urinary pain, burning, frequency, and hematuria, rashes, and pruritis following bathing.   She does report BRBPR but she says it is from hemorrhoids.  She also has IBS which causes diarrhea usually but it can cause constipation for her at times as well.  She reports that she has IBS flares daily lasting about 3-4 hours.   Her last colonoscopy by Dr. Juanda Chance was in 2009.  She does have a history of Thyroid Cancer treated via thyroidectomy followed by Iodine treatment.  She is followed by Endocrinologist Dr. Nonie Hoyer who manages her Synthroid.   Overall she feels well and denies any complaints.  Hematologically, ROS questioning is negative.   Patient education was provided regarding Leukocytosis and its causes including Leukemia, but also tobacco abuse.  She was educated regarding WBCs and the 5 types of WBCs.  She was educated regarding their role within the body.   I personally reviewed and went over laboratory results with the patient.  Of  note, her Hgb is elevated at 16.3.    Patient education provided regarding RBC and Hemoglobin.   This may very well be related to her tobacco abuse and the body's physiologic response to Lung disease.   Since she does have leukocytosis and >30 pack year smoking history she qualifies for a low-dose spiral CT of chest without contrast annually x 3 years.  She had one in Feb 2013.  She is due now for another.  According to the National Lung Screening Trial and the [place] Preventative Task Force, this is a recommended screening program and shows superiority and decreased mortality over chest radiographs and sputum evaluations.    FAMILY HISTORY: family history includes Cancer in an unspecified family member; Cirrhosis in her mother; Coronary artery disease in her father; and Hypertension in her maternal grandmother. No leukemia, MM, and lymphoma in family history  Father deceased at 43 from MI Mother deceased at age 8 from cirrhosis of liver from EtOHism She is only child Son 51 yo and Daughter 71 year old who are both healthy 2 healthy grandchildren   PAST MEDICAL HISTORY:  has a past medical history of Hypertension; Hyperlipidemia; Osteoporosis; Thyroid cancer (2004); Diverticulosis; IBS (irritable bowel syndrome); Tobacco abuse; Need for prophylactic hormone replacement therapy (postmenopausal); CAD (coronary artery disease); Thyroid nodule (2004); Osteopenia; and Leukocytosis, unspecified (07/24/2012).       CURRENT MEDICATIONS: See CHL   SOCIAL HISTORY:  Originally from [place], [place] and presenetly resides there.  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  She is retired.  She lives with her husband at home and they have been married for 45 years.  She denies any EtOH or illicit drug abuse.  She admits to 1 ppd x 40 years smoking history.  Her husband smokes as well.      ALLERGIES: Banana; Bupropion hcl; and Chantix  Intolerance to  Wellbutrin and Chantix- Paranoia Banana- Anaphylactoid-like reaction   LABORATORY DATA:  CBC    Component Value Date/Time   WBC 14.4* 07/06/2012 0927   RBC 5.1 07/06/2012 0927   HGB 16.3* 07/06/2012 0927   HCT 47.0 07/06/2012 0927   MCV 92.0 07/06/2012 0927   MCH 31.8* 07/06/2012 0927   MCHC 34.6 07/06/2012 0927      Chemistry   No results found for this basename: NA, K, CL, CO2, BUN, CREATININE, GLU      Component Value Date/Time   ALKPHOS 72 07/06/2012 0909   AST 16 07/06/2012 0909   ALT 21 07/06/2012 0909   BILITOT 0.3 07/06/2012 1610        RADIOGRAPHY:   06/16/2011  *RADIOLOGY REPORT*  Clinical Data: Persistent cough, smoking history, history of  thyroid carcinoma with thyroidectomy in 2003  CT CHEST WITHOUT CONTRAST  Technique: Multidetector CT imaging of the chest was performed  following the standard protocol without IV contrast.  Comparison: None.  Findings: On the lung window images only a small focus of ground-  glass opacity is noted anteriorly which on sagittal images abuts  the minor fissure and probably represents a small fissural lymph  node. Linear scarring is noted anteromedially in the right middle  lobe and within the lingula. No suspicious lung nodule is seen.  No pleural effusion is noted. The airway is patent.  On soft tissue window images, the thyroid gland has been resected.  No mediastinal or hilar adenopathy is seen on this unenhanced  study. Coronary artery calcifications are present. The upper  abdomen as is unremarkable and surgical clips are present from  prior cholecystectomy. No bony abnormality is seen.  IMPRESSION:  1. No suspicious abnormality on CT of the chest.  2. No evidence of recurrence of carcinoma. No adenopathy.  3. Coronary artery calcifications.  Original Report Authenticated By: Juline Patch, M.D.    REVIEW OF SYSTEMS: Patient reports no health concerns.   PHYSICAL EXAM:  vitals were not taken for this visit. General  appearance: alert, cooperative, appears stated age, no distress and moderately obese Head: Normocephalic, without obvious abnormality, atraumatic Neck: no adenopathy, supple, symmetrical, trachea midline and S/P thyroidectomy Lymph nodes: Cervical, supraclavicular, and axillary nodes normal. Resp: diminished breath sounds bilaterally Back: symmetric, no curvature. ROM normal. No CVA tenderness. Cardio: regular rate and rhythm, S1, S2 normal, no murmur, click, rub or gallop GI: soft, non-tender; bowel sounds normal; no masses,  no organomegaly Extremities: extremities normal, atraumatic, no cyanosis or edema Neurologic: Alert and oriented X 3, normal strength and tone. Normal symmetric reflexes. Normal coordination and gait     IMPRESSION:  1. Leukocytosis, without differential.  Nonspecific at this time. Likely secondary to tobacco abuse. 2. Tobacco abuse, 1 ppd x 40 years. 3. Elevated Hemoglobin, ?Secondary to #2? 4. History of Thyroid cancer 5. CAD 6. Dyslipidemia 7. GERD 8. HTN 9. IBS 10. Osteoporosis, managed by PCP   PLAN:  1. I personally reviewed and went over laboratory results with the patient. 2. I personally reviewed and went over radiographic studies with the patient. 3. Patient education regarding leukocytosis  4. Patient education regarding elevated Hemoglobin 5. Smoking cessation education provided.  6. Patient encouraged to refrain from smoking for 6-8 weeks and then repeat labs 7. Labs today: CBC diff, CMET, Peripheral blood smear for Dr. Mariel Sleet to review. 8. CT of chest without contrast this month.  Since she does have leukocytosis and >30 pack year smoking history she qualifies for a low-dose spiral CT of chest without contrast annually x 3 years.  She had one in Feb 2013.  She is due now for another.  According to the National Lung Screening Trial and the [place] Preventative Task Force, this is a recommended screening program and shows superiority and decreased  mortality over chest radiographs and sputum evaluations.  9. Labs in 8 weeks: CBC diff 10. Return in 8 weeks for follow-up.  All questions were answered.  Patient knows to call the clinic with any questions or concerns.   Patient and plan discussed with Dr. Glenford Peers and he is in agreement with the aforementioned.  Patient seen and examined by Dr. Mariel Sleet as well.  Nishaan Stanke

## 2012-07-24 NOTE — Progress Notes (Signed)
Madison [name] presented for labwork. Labs per MD order drawn via Peripheral Line 23 gauge needle inserted in left antecubital.  Good blood return present. Procedure without incident.  Needle removed intact. Patient tolerated procedure well.

## 2012-07-24 NOTE — Patient Instructions (Addendum)
[facility] Discharge Instructions  RECOMMENDATIONS MADE BY THE CONSULTANT AND ANY TEST RESULTS WILL BE SENT TO YOUR REFERRING PHYSICIAN.  Lab work today. We will schedule you for a CT Scan Chest within the next couple of weeks. You need to STOP smoking. You need to not smoke at all for the next 8 weeks until we can recheck your blood counts. Return to clinic in 8 weeks for lab work. Return after lab work to see MD.  Thank you for choosing [facility] to provide your oncology and hematology care.  To afford each patient quality time with our providers, please arrive at least 15 minutes before your scheduled appointment time.  With your help, our goal is to use those 15 minutes to complete the necessary work-up to ensure our physicians have the information they need to help with your evaluation and healthcare recommendations.    Effective January 1st, 2014, we ask that you re-schedule your appointment with our physicians should you arrive 10 or more minutes late for your appointment.  We strive to give you quality time with our providers, and arriving late affects you and other patients whose appointments are after yours.    Again, thank you for choosing [facility].  Our hope is that these requests will decrease the amount of time that you wait before being seen by our physicians.       _____________________________________________________________  Should you have questions after your visit to [facility], please contact our office at [phone] between the hours of 8:30 a.m. and 5:00 p.m.  Voicemails left after 4:30 p.m. will not be returned until the following business day.  For prescription refill requests, have your pharmacy contact our office with your prescription refill request.

## 2012-08-01 ENCOUNTER — Other Ambulatory Visit (HOSPITAL_COMMUNITY): Payer: Self-pay | Admitting: Oncology

## 2012-08-01 NOTE — Progress Notes (Signed)
I have reviewed her peripheral blood smear today. She has but a few large platelets. Her red cells appear unremarkable. Her white cells are plenty full with several atypical lymphocytes but no other abnormalities that I can appreciate. She is a very nice mixture of neutrophils, lymphocytes, few monocytes, rare eosinophil, and again the occasional atypical lymphocyte.

## 2012-08-02 ENCOUNTER — Ambulatory Visit (INDEPENDENT_AMBULATORY_CARE_PROVIDER_SITE_OTHER): Payer: Medicare Other

## 2012-08-02 ENCOUNTER — Other Ambulatory Visit: Payer: Self-pay

## 2012-08-02 ENCOUNTER — Ambulatory Visit (INDEPENDENT_AMBULATORY_CARE_PROVIDER_SITE_OTHER): Payer: Medicare Other | Admitting: Pharmacist

## 2012-08-02 VITALS — BP 164/90 | HR 70 | Ht 64.0 in | Wt 170.0 lb

## 2012-08-02 DIAGNOSIS — M949 Disorder of cartilage, unspecified: Secondary | ICD-10-CM

## 2012-08-02 DIAGNOSIS — M810 Age-related osteoporosis without current pathological fracture: Secondary | ICD-10-CM

## 2012-08-02 DIAGNOSIS — IMO0001 Reserved for inherently not codable concepts without codable children: Secondary | ICD-10-CM

## 2012-08-02 DIAGNOSIS — I10 Essential (primary) hypertension: Secondary | ICD-10-CM

## 2012-08-02 DIAGNOSIS — M8580 Other specified disorders of bone density and structure, unspecified site: Secondary | ICD-10-CM

## 2012-08-02 DIAGNOSIS — M899 Disorder of bone, unspecified: Secondary | ICD-10-CM

## 2012-08-02 NOTE — Progress Notes (Signed)
Patient ID: Madison [name], female   DOB: 1945-07-12, 67 y.o.   MRN: [id number] [facility] Current Height: Height: 5\' 4"  (162.6 cm)      Max Lifetime Height:  5\' 5"   Current Weight: Weight: 170 lb (77.111 kg)       Ethnicity: CAUCASIAN  BP:       HR:         HPI: Does pt already have a diagnosis of:  Osteopenia?  Yes Osteoporosis?  No  Back Pain?  Yes    - history of back surgery    Kyphosis?  No Prior fracture?  Yes elbow and foot X 2 Med(s) for Osteoporosis/Osteopenia:  None currently Med(s) previously tried for Osteoporosis/Osteopenia:  Fosamax - broke foot 2 times while taking fosamax and patient was instructed by orthopedist to stop.                                                              PMH: Age at menopause:  67yo Hysterectomy?  No Oophorectomy?  No HRT? Yes - Former.  Type/duration: 3 years took prempro - stopped after MI Steroid Use?  No Thyroid med?  Yes History of cancer?  Yes-  thyroid cancer History of digestive disorders (ie Crohn's)?  No Current or previous eating disorders?  No Last Vitamin D Result:  41 (05/2012) Last GFR Result:  73 (05/2012)   FH/SH: Family history of osteoporosis?  No Parent with history of hip fracture?  No Family history of breast cancer?  No Exercise?  Yes walking Caffeine?  Yes coffee daily Smoking?  Yes - patient is trying to quit.  Has been using e-cigrettes in place of regular cigarettes for last 2 weeks.  Has decreased to only 3 regular cigarettes daily. Alcohol?  No    Calcium Assessment Calcium Intake  # of servings/day  Calcium mg  Milk (8 oz) 1  x  300  = 300mg   Yogurt (4 oz) 0 x  200 = 0  Cheese (1 oz) 0 x  200 = 0  Other Calcium sources    Ca supplement 0 = 0   Estimated calcium intake per day 550mg     DEXA Results Date of Test T-Score for AP Spine L1-L4 T-Score for Total Left Hip T-Score for Total Right Hip  08/02/2012 -1.5 -1.2 -1.1  11/26/2009 -1.6 -1.2 -1.2  09/27/2007 -1.9 -1.3 -1.2   08/30/2005 -1.8 -1.3 -1.4   FRAX 10 year estimate: Total FX risk:  18%  (consider medication if >/= 20%) Hip FX risk:  4.6%  (consider medication if >/= 3%)  Assessment: 1.  Osteopenia with high FRAX risk estimate but stable BMD.  Patient refuses any treatment currently due to perceived failure of previous treatment to prevent fractures in the past.  2.  White coat hypertension - per patient report BP at home is usually 120"s /80's.  Recommendations:  recommend calcium 1200mg  daily either through supplementation or diet.   recommend weight bearing exercise - 30 minutes at least 4 days per week.   Counseled and educated about fall risk and prevention Continue to monitor BP readings at home.  Goal BP is less than 140/90.   Recheck DEXA:  2 years  Time spent counseling patient:  20 minutes  Henrene Pastor, PharmD, CPP

## 2012-08-07 ENCOUNTER — Ambulatory Visit (HOSPITAL_COMMUNITY)
Admission: RE | Admit: 2012-08-07 | Discharge: 2012-08-07 | Disposition: A | Discharge status: Home / Self Care | Payer: Medicare Other | Source: Ambulatory Visit | Attending: Oncology | Admitting: Oncology

## 2012-08-07 DIAGNOSIS — I10 Essential (primary) hypertension: Secondary | ICD-10-CM | POA: Insufficient documentation

## 2012-08-07 DIAGNOSIS — F17200 Nicotine dependence, unspecified, uncomplicated: Secondary | ICD-10-CM

## 2012-08-07 DIAGNOSIS — D72829 Elevated white blood cell count, unspecified: Secondary | ICD-10-CM | POA: Insufficient documentation

## 2012-08-30 ENCOUNTER — Ambulatory Visit (INDEPENDENT_AMBULATORY_CARE_PROVIDER_SITE_OTHER): Payer: Medicare Other | Admitting: Family Medicine

## 2012-08-30 ENCOUNTER — Encounter: Payer: Self-pay | Admitting: Family Medicine

## 2012-08-30 VITALS — BP 135/81 | HR 74 | Temp 97.3°F | Ht 64.0 in | Wt 168.0 lb

## 2012-08-30 DIAGNOSIS — D72829 Elevated white blood cell count, unspecified: Secondary | ICD-10-CM

## 2012-08-30 DIAGNOSIS — E559 Vitamin D deficiency, unspecified: Secondary | ICD-10-CM

## 2012-08-30 DIAGNOSIS — R5381 Other malaise: Secondary | ICD-10-CM

## 2012-08-30 DIAGNOSIS — E785 Hyperlipidemia, unspecified: Secondary | ICD-10-CM

## 2012-08-30 DIAGNOSIS — I10 Essential (primary) hypertension: Secondary | ICD-10-CM

## 2012-08-30 DIAGNOSIS — F17200 Nicotine dependence, unspecified, uncomplicated: Secondary | ICD-10-CM

## 2012-08-30 DIAGNOSIS — R5383 Other fatigue: Secondary | ICD-10-CM

## 2012-08-30 LAB — POCT CBC
Granulocyte percent: 54.2 %G (ref 37–80)
HCT, POC: 48.9 % — AB (ref 37.7–47.9)
Hemoglobin: 16.3 g/dL — AB (ref 12.2–16.2)
Lymph, poc: 4.2 — AB (ref 0.6–3.4)
MCH, POC: 30.7 pg (ref 27–31.2)
MCHC: 33.3 g/dL (ref 31.8–35.4)
MCV: 92.3 fL (ref 80–97)
MPV: 8.7 fL (ref 0–99.8)
POC Granulocyte: 5.7 (ref 2–6.9)
POC LYMPH PERCENT: 39.8 %L (ref 10–50)
Platelet Count, POC: 183 10*3/uL (ref 142–424)
RBC: 5.3 M/uL (ref 4.04–5.48)
RDW, POC: 12.9 %
WBC: 10.6 10*3/uL — AB (ref 4.6–10.2)

## 2012-08-30 LAB — CK: Total CK: 78 U/L (ref 7–177)

## 2012-08-30 MED ORDER — LOSARTAN POTASSIUM 50 MG PO TABS: 50.0000 mg | ORAL_TABLET | Freq: Every day | ORAL | Status: DC

## 2012-08-30 MED ORDER — ERGOCALCIFEROL 1.25 MG (50000 UT) PO CAPS: 50000.0000 [IU] | ORAL_CAPSULE | ORAL | Status: DC

## 2012-08-30 MED ORDER — EZETIMIBE 10 MG PO TABS: 5.0000 mg | ORAL_TABLET | Freq: Every day | ORAL | Status: DC

## 2012-08-30 MED ORDER — NIACIN ER (ANTIHYPERLIPIDEMIC) 1000 MG PO TBCR: 1000.0000 mg | EXTENDED_RELEASE_TABLET | Freq: Every day | ORAL | Status: DC

## 2012-08-30 MED ORDER — ROSUVASTATIN CALCIUM 20 MG PO TABS: 40.0000 mg | ORAL_TABLET | Freq: Every day | ORAL | Status: DC

## 2012-08-30 MED ORDER — METOPROLOL TARTRATE 50 MG PO TABS: 50.0000 mg | ORAL_TABLET | Freq: Two times a day (BID) | ORAL | Status: DC

## 2012-08-30 NOTE — Progress Notes (Signed)
Subjective:    Patient ID: Madison [name], female    DOB: [birth year], 67 y.o.   MRN: [id number]  HPI This patient presents for recheck of multiple medical problems. No one accompanies the patient today.  Patient Active Problem List   Diagnosis Date Noted  . Leukocytosis, unspecified 07/24/2012  . TOBACCO ABUSE 10/29/2009  . THYROID CANCER 11/30/2007  . hyperlipidemia 11/30/2007  . HYPERTENSION 11/30/2007  . CORONARY ARTERY DISEASE 11/30/2007  . IRRITABLE BOWEL SYNDROME 11/30/2007  . OSTEOPOROSIS 11/30/2007    In addition, she is working on smoking cessation. She also had problems with taking half of a zetia. She has increased muscle aches and especially dis-comfort and both anterior hips.  The allergies, current medications, past medical history, surgical history, family and social history are reviewed.  Immunizations reviewed.  Health maintenance reviewed.  The following items are outstanding: None      Review of Systems  Constitutional: Positive for fatigue.  HENT: Positive for rhinorrhea (due to allergies), sneezing and postnasal drip.   Eyes: Negative.   Respiratory: Negative.   Cardiovascular: Negative.   Gastrointestinal: Positive for abdominal pain and diarrhea (due to IBS).  Endocrine: Positive for cold intolerance and heat intolerance (due to thyroid).  Genitourinary: Negative.   Musculoskeletal: Positive for myalgias (possibly due to Zetia), back pain (LBP) and arthralgias (hips, knees).  Allergic/Immunologic: Positive for environmental allergies.  Neurological: Positive for tremors (due to thyroid).  Psychiatric/Behavioral: Positive for sleep disturbance (nightly).       Objective:   Physical Exam BP 135/81  Pulse 74  Temp(Src) 97.3 F (36.3 C) (Oral)  Ht 5\' 4"  (1.626 m)  Wt 168 lb (76.204 kg)  BMI 28.82 kg/m2  The patient appeared well nourished and normally developed, alert and oriented to time and place. Speech, behavior and judgement appear  normal. Vital signs as documented.  Head exam is unremarkable. No scleral icterus or pallor noted. Increased nasal congestion bilaterally. Neck is without jugular venous distension, thyromegally, or carotid bruits. Carotid upstrokes are brisk bilaterally. No cervical adenopathy. Lungs are clear anteriorly and posteriorly to auscultation. Normal respiratory effort. Cardiac exam reveals regular rate and rhythm at 60 per minute. First and second heart sounds normal.  No murmurs, rubs or gallops.  Abdominal exam reveals normal bowl sounds, no masses, no organomegaly and no aortic enlargement. No inguinal adenopathy. Extremities are nonedematous and both femoral and pedal pulses are normal. Skin without pallor or jaundice.  Warm and dry, without rash. Neurologic exam reveals normal deep tendon reflexes and normal sensation.          Assessment & Plan:  1. Leukocytosis, unspecified - ezetimibe (ZETIA) 10 MG tablet; Take 0.5 tablets (5 mg total) by mouth daily. Take 1/2 of 10mg  tablet daily  Dispense: 30 tablet; Refill: 11 - CK  2. TOBACCO ABUSE - ezetimibe (ZETIA) 10 MG tablet; Take 0.5 tablets (5 mg total) by mouth daily. Take 1/2 of 10mg  tablet daily  Dispense: 30 tablet; Refill: 11 - CK  3. Essential hypertension, benign - BASIC METABOLIC PANEL WITH GFR; Standing - BASIC METABOLIC PANEL WITH GFR  4. Hyperlipemia - Hepatic function panel; Standing - NMR Lipoprofile with Lipids; Standing - Hepatic function panel - NMR Lipoprofile with Lipids  5. Vitamin D deficiency - Vitamin D 25 hydroxy; Standing - Vitamin D 25 hydroxy  6. Fatigue - POCT CBC; Standing - Vitamin D 25 hydroxy; Standing - POCT CBC - Vitamin D 25 hydroxy - CK   Patient Instructions  Continue current  meds and aggressive therapeutic lifestyle changes Always be careful not to climb so you won't fall Continue to try to stop smoking, both you and your husband

## 2012-08-30 NOTE — Patient Instructions (Addendum)
Continue current meds and aggressive therapeutic lifestyle changes Always be careful not to climb so you won't fall Continue to try to stop smoking, both you and your husband

## 2012-08-31 LAB — BASIC METABOLIC PANEL WITH GFR
BUN: 13 mg/dL (ref 6–23)
CO2: 23 mEq/L (ref 19–32)
Calcium: 9.3 mg/dL (ref 8.4–10.5)
Chloride: 107 mEq/L (ref 96–112)
Creat: 0.85 mg/dL (ref 0.50–1.10)
GFR, Est African American: 83 mL/min
GFR, Est Non African American: 72 mL/min
Glucose, Bld: 99 mg/dL (ref 70–99)
Potassium: 4.6 mEq/L (ref 3.5–5.3)
Sodium: 139 mEq/L (ref 135–145)

## 2012-08-31 LAB — NMR LIPOPROFILE WITH LIPIDS
Cholesterol, Total: 107 mg/dL (ref <200)
HDL Particle Number: 26.2 umol/L — ABNORMAL LOW (ref >=30.5)
HDL Size: 8.7 nm — ABNORMAL LOW (ref >=9.2)
HDL-C: 32 mg/dL — ABNORMAL LOW (ref >=40)
LDL (calc): 42 mg/dL (ref <100)
LDL Particle Number: 729 nmol/L (ref <1000)
LDL Size: 20.2 nm — ABNORMAL LOW (ref >20.5)
LP-IR Score: 74 — ABNORMAL HIGH (ref <=45)
Large HDL-P: 2.9 umol/L — ABNORMAL LOW (ref >=4.8)
Large VLDL-P: 6.5 nmol/L — ABNORMAL HIGH (ref <=2.7)
Small LDL Particle Number: 454 nmol/L (ref <=527)
Triglycerides: 165 mg/dL — ABNORMAL HIGH (ref <150)
VLDL Size: 51.7 nm — ABNORMAL HIGH (ref <=46.6)

## 2012-08-31 LAB — HEPATIC FUNCTION PANEL
ALT: 22 U/L (ref 0–35)
AST: 19 U/L (ref 0–37)
Albumin: 4.6 g/dL (ref 3.5–5.2)
Alkaline Phosphatase: 67 U/L (ref 39–117)
Bilirubin, Direct: 0.1 mg/dL (ref 0.0–0.3)
Indirect Bilirubin: 0.2 mg/dL (ref 0.0–0.9)
Total Bilirubin: 0.3 mg/dL (ref 0.3–1.2)
Total Protein: 7 g/dL (ref 6.0–8.3)

## 2012-08-31 LAB — VITAMIN D 25 HYDROXY (VIT D DEFICIENCY, FRACTURES): Vit D, 25-Hydroxy: 56 ng/mL (ref 30–89)

## 2012-09-18 ENCOUNTER — Encounter (HOSPITAL_COMMUNITY): Payer: Medicare Other | Attending: Oncology

## 2012-09-18 DIAGNOSIS — F17200 Nicotine dependence, unspecified, uncomplicated: Secondary | ICD-10-CM

## 2012-09-18 DIAGNOSIS — D582 Other hemoglobinopathies: Secondary | ICD-10-CM

## 2012-09-18 DIAGNOSIS — D72820 Lymphocytosis (symptomatic): Secondary | ICD-10-CM | POA: Insufficient documentation

## 2012-09-18 DIAGNOSIS — D72829 Elevated white blood cell count, unspecified: Secondary | ICD-10-CM | POA: Insufficient documentation

## 2012-09-18 DIAGNOSIS — D72819 Decreased white blood cell count, unspecified: Secondary | ICD-10-CM | POA: Insufficient documentation

## 2012-09-18 LAB — CBC WITH DIFFERENTIAL/PLATELET
Basophils Absolute: 0.1 10*3/uL (ref 0.0–0.1)
Basophils Relative: 0 % (ref 0–1)
Eosinophils Absolute: 0.5 10*3/uL (ref 0.0–0.7)
Eosinophils Relative: 3 % (ref 0–5)
HCT: 49.5 % — ABNORMAL HIGH (ref 36.0–46.0)
Hemoglobin: 16.5 g/dL — ABNORMAL HIGH (ref 12.0–15.0)
Lymphocytes Relative: 30 % (ref 12–46)
Lymphs Abs: 4.7 10*3/uL — ABNORMAL HIGH (ref 0.7–4.0)
MCH: 31.7 pg (ref 26.0–34.0)
MCHC: 33.3 g/dL (ref 30.0–36.0)
MCV: 95 fL (ref 78.0–100.0)
Monocytes Absolute: 0.6 10*3/uL (ref 0.1–1.0)
Monocytes Relative: 4 % (ref 3–12)
Neutro Abs: 9.7 10*3/uL — ABNORMAL HIGH (ref 1.7–7.7)
Neutrophils Relative %: 62 % (ref 43–77)
Platelets: 201 10*3/uL (ref 150–400)
RBC: 5.21 MIL/uL — ABNORMAL HIGH (ref 3.87–5.11)
RDW: 12.9 % (ref 11.5–15.5)
WBC: 15.6 10*3/uL — ABNORMAL HIGH (ref 4.0–10.5)

## 2012-09-18 NOTE — Progress Notes (Signed)
Labs drawn today for cbc/diff 

## 2012-09-29 ENCOUNTER — Encounter (HOSPITAL_BASED_OUTPATIENT_CLINIC_OR_DEPARTMENT_OTHER): Payer: Medicare Other

## 2012-09-29 VITALS — BP 157/88 | HR 72 | Wt 170.7 lb

## 2012-09-29 DIAGNOSIS — F17200 Nicotine dependence, unspecified, uncomplicated: Secondary | ICD-10-CM

## 2012-09-29 DIAGNOSIS — I10 Essential (primary) hypertension: Secondary | ICD-10-CM

## 2012-09-29 DIAGNOSIS — M810 Age-related osteoporosis without current pathological fracture: Secondary | ICD-10-CM

## 2012-09-29 DIAGNOSIS — D72820 Lymphocytosis (symptomatic): Secondary | ICD-10-CM

## 2012-09-29 DIAGNOSIS — D72819 Decreased white blood cell count, unspecified: Secondary | ICD-10-CM

## 2012-09-29 LAB — CBC WITH DIFFERENTIAL/PLATELET
Basophils Absolute: 0 10*3/uL (ref 0.0–0.1)
Basophils Relative: 0 % (ref 0–1)
Eosinophils Absolute: 0.6 10*3/uL (ref 0.0–0.7)
Eosinophils Relative: 6 % — ABNORMAL HIGH (ref 0–5)
HCT: 47.3 % — ABNORMAL HIGH (ref 36.0–46.0)
Hemoglobin: 16 g/dL — ABNORMAL HIGH (ref 12.0–15.0)
Lymphocytes Relative: 43 % (ref 12–46)
Lymphs Abs: 4.1 10*3/uL — ABNORMAL HIGH (ref 0.7–4.0)
MCH: 31.7 pg (ref 26.0–34.0)
MCHC: 33.8 g/dL (ref 30.0–36.0)
MCV: 93.8 fL (ref 78.0–100.0)
Monocytes Absolute: 0.7 10*3/uL (ref 0.1–1.0)
Monocytes Relative: 7 % (ref 3–12)
Neutro Abs: 4.2 10*3/uL (ref 1.7–7.7)
Neutrophils Relative %: 44 % (ref 43–77)
Platelets: 186 10*3/uL (ref 150–400)
RBC: 5.04 MIL/uL (ref 3.87–5.11)
RDW: 12.8 % (ref 11.5–15.5)
WBC: 9.6 10*3/uL (ref 4.0–10.5)

## 2012-09-29 LAB — LACTATE DEHYDROGENASE: LDH: 202 U/L (ref 94–250)

## 2012-09-29 NOTE — Progress Notes (Signed)
[facility] [phone]   [phone]  OFFICE PROGRESS NOTE  Rudi Heap, MD [address] [place] [address]  DIAGNOSIS: Leukocytosis.    INTERVAL HISTORY:   Unknown Madison [name] 67 y.o. female returns to the clinic today for  Scheduled followup of leukocytosis believed to be reactive .  Patient is a heavy cigarette smoker and continues to smoke at least 3/4  of a pack a day.  She has at least a 75 pack years of smoking . She states that she has tried every available method of smoking cessation without success.  She states that her husband also smokes and he is a strong  disincentive to quit smoking.  As a part of her initial evaluation , patient had review of peripheral blood smear by Dr. Mariel Sleet "I have reviewed her peripheral blood smear today. She has but a few large platelets. Her red cells appear unremarkable. Her white cells are plenty full with several atypical lymphocytes but no other abnormalities that I can appreciate. She is a very nice mixture of neutrophils, lymphocytes, few monocytes, rare eosinophil, and again the occasional atypical lymphocyte"  She tells me that she has no new problem except for " sinus stuff ".  She denies any new infection or medication. She denies night sweats.She is eating well and also denies fever.  She also denies shortness of breath, fatigue  or unintended weight loss.    MEDICAL HISTORY: Past Medical History  Diagnosis Date  . Hypertension   . Hyperlipidemia   . Osteoporosis   . Thyroid cancer 2004    Dr. Demaris Callander   . Diverticulosis   . IBS (irritable bowel syndrome)   . Tobacco abuse   . Need for prophylactic hormone replacement therapy (postmenopausal)   . CAD (coronary artery disease)     PCI of an occluded right coronary artery 2001. 70% LAD stenosis, 30% circumflex stenosis.  . Thyroid nodule 2004  . Osteopenia   . Leukocytosis, unspecified 07/24/2012    ALLERGIES:  is allergic to banana;  bupropion hcl; chantix; and zetia.  MEDICATIONS:  Current Outpatient Prescriptions  Medication Sig Dispense Refill  . aspirin 325 MG tablet Take 325 mg by mouth daily.        . Diphenhydramine-APAP, sleep, (TYLENOL PM EXTRA STRENGTH PO) Take by mouth as needed.        . ergocalciferol (VITAMIN D2) 50000 UNITS capsule Take 1 capsule (50,000 Units total) by mouth once a week.  4 capsule  11  . levothyroxine (SYNTHROID, LEVOTHROID) 137 MCG tablet Take 137 mcg by mouth as directed. Take one tablet by mouth daily then take two tablets by mouth on Sunday .       . losartan (COZAAR) 50 MG tablet Take 1 tablet (50 mg total) by mouth daily.  30 tablet  11  . metoprolol (LOPRESSOR) 50 MG tablet Take 1 tablet (50 mg total) by mouth 2 (two) times daily. Take half tablet two times a day  60 tablet  11  . niacin (NIASPAN) 1000 MG CR tablet Take 1 tablet (1,000 mg total) by mouth at bedtime.  60 tablet  11  . rosuvastatin (CRESTOR) 20 MG tablet Take 2 tablets (40 mg total) by mouth daily.  30 tablet  11  . ezetimibe (ZETIA) 10 MG tablet Take 0.5 tablets (5 mg total) by mouth daily. Take 1/2 of 10mg  tablet daily  30 tablet  11  . nitroGLYCERIN (NITROSTAT) 0.4 MG SL  tablet Place 1 tablet (0.4 mg total) under the tongue every 5 (five) minutes as needed for chest pain.  90 tablet  12   No current facility-administered medications for this visit.    SURGICAL HISTORY:  Past Surgical History  Procedure Laterality Date  . Appendectomy  2002  . Back surgery  06/2000  . Cholecystectomy  07/2002  . Thyroidectomy Bilateral 02/2003  . Lipoma excision Right 08/2003     REVIEW OF SYSTEMS: 14 point review of system is as in the history above otherwise negative.    PHYSICAL EXAMINATION:  Blood pressure 157/88, pulse 72, weight 170 lb 11.2 oz (77.429 kg). GENERAL: No distress. SKIN:  No rashes or significant lesions  HEAD: Normocephalic, No masses, lesions, tenderness or abnormalities  EYES: Conjunctiva are pink  and non-injected  ENT: External ears normal ,lips, buccal mucosa, and tongue normal and mucous membranes are moist  LYMPH: No palpable lymphadenopathy, in the neck or supraclavicular areas or axilla. LUNGS: markedly decreased breath sounds bilaterally , no crackles or wheezes HEART: regular rate & rhythm, no murmurs, no gallops, S1 normal and S2 normal no S3. ABDOMEN: Abdomen soft, non-tender, normal bowel sounds, no masses or organomegaly and no hepatosplenomegaly palpable. EXTREMITIES: No edema, no skin discoloration or tenderness NEURO: alert & oriented , no focal motor/sensory deficits.     LABORATORY DATA: Lab Results  Component Value Date   WBC 15.6* 09/18/2012   HGB 16.5* 09/18/2012   HCT 49.5* 09/18/2012   MCV 95.0 09/18/2012   PLT 201 09/18/2012      Chemistry      Component Value Date/Time   NA 139 08/30/2012 0921   K 4.6 08/30/2012 0921   CL 107 08/30/2012 0921   CO2 23 08/30/2012 0921   BUN 13 08/30/2012 0921   CREATININE 0.85 08/30/2012 0921   CREATININE 0.72 07/24/2012 1244      Component Value Date/Time   CALCIUM 9.3 08/30/2012 0921   ALKPHOS 67 08/30/2012 0921   AST 19 08/30/2012 0921   ALT 22 08/30/2012 0921   BILITOT 0.3 08/30/2012 0921       RADIOGRAPHIC STUDIES: No results found.   ASSESSMENT:  Ms. Pallo most likely has a reactive leukocytosis( neutrophilia and lymphocytosis) which I suspect is related to her smoking.  I had a very long discussion with her regarding the adverse effects of severe smoking and cessation techniques.  I also noted that she does have some lymphocytosis on differential WBC.  Additionally she was noted to have atypical lymphocytes on peripheral blood smear by Dr. Mariel Sleet.  In view of this I think that's doing a flow cytometry on peripheral blood will now be unreasonable though I suspect that this is most likely reactive .  PLAN:  1. I counseled patient on smoking cessation. 2. She'll return to clinic in 3 months. 3. I suggested to her to  try to convince her husband to try the cessation program with her since he may be a strong incentive for her to smoke. 4.  Follow up on flow cytometry results.    All questions were satisfactorily answered.She knows to call if she has any concern.  I spent 15 minutes counseling the patient face to face. The total time spent in the appointment was 30 minutes.   Sherral Hammers, MD FACP. Hematology/Oncology.

## 2012-09-29 NOTE — Patient Instructions (Addendum)
[facility] Discharge Instructions  RECOMMENDATIONS MADE BY THE CONSULTANT AND ANY TEST RESULTS WILL BE SENT TO YOUR REFERRING PHYSICIAN.  EXAM FINDINGS BY THE PHYSICIAN TODAY AND SIGNS OR SYMPTOMS TO REPORT TO CLINIC OR PRIMARY PHYSICIAN: Exam and discussion by MD.  It would be great if you could stop smoking.  The leukocytosis could be a reactive process related to your smoking.  We will make referral for Smoking Cessation Classes at [facility].  They will contact you regarding the classes.  We will also check some blood work today to make sure that there's nothing else going on.  MEDICATIONS PRESCRIBED:  none  SPECIAL INSTRUCTIONS/FOLLOW-UP: Follow-up in 3 months.  Thank you for choosing [facility] to provide your oncology and hematology care.  To afford each patient quality time with our providers, please arrive at least 15 minutes before your scheduled appointment time.  With your help, our goal is to use those 15 minutes to complete the necessary work-up to ensure our physicians have the information they need to help with your evaluation and healthcare recommendations.    Effective January 1st, 2014, we ask that you re-schedule your appointment with our physicians should you arrive 10 or more minutes late for your appointment.  We strive to give you quality time with our providers, and arriving late affects you and other patients whose appointments are after yours.    Again, thank you for choosing [facility].  Our hope is that these requests will decrease the amount of time that you wait before being seen by our physicians.       _____________________________________________________________  Should you have questions after your visit to [facility], please contact our office at [phone] between the hours of 8:30 a.m. and 5:00 p.m.  Voicemails left after 4:30 p.m. will not be returned until the following business  day.  For prescription refill requests, have your pharmacy contact our office with your prescription refill request.

## 2012-11-08 ENCOUNTER — Telehealth (HOSPITAL_COMMUNITY): Payer: Self-pay | Admitting: *Deleted

## 2012-11-08 ENCOUNTER — Telehealth: Payer: Self-pay | Admitting: Family Medicine

## 2012-11-08 NOTE — Telephone Encounter (Signed)
Not related to Leukocytosis.  Follow-up with Dr. Christell Constant as scheduled.

## 2012-11-08 NOTE — Telephone Encounter (Signed)
Madison [name] called and wanted to know what to do about the rash under her breast  And she was told here when last seen to let us know if she did get a rash to contact us. I told her I would send you the message and then we would call her back.

## 2012-11-08 NOTE — Telephone Encounter (Signed)
The areas of rash are mid chest where band of her bra goes, and left upper thigh, not in skin folds. Raised and red, no itching. She has an appt with Dr. Christell Constant tomorrow. Her main question for [place] is could it be related to leukocytosis?

## 2012-11-08 NOTE — Telephone Encounter (Signed)
appt given with dwm tomorrow at 3:30

## 2012-11-08 NOTE — Telephone Encounter (Signed)
Nurse: Please find out from the patient where the rash is.  Inframammary fold?

## 2012-11-09 ENCOUNTER — Ambulatory Visit (INDEPENDENT_AMBULATORY_CARE_PROVIDER_SITE_OTHER): Payer: Medicare Other | Admitting: Family Medicine

## 2012-11-09 ENCOUNTER — Encounter: Payer: Self-pay | Admitting: Family Medicine

## 2012-11-09 VITALS — BP 159/80 | HR 75 | Temp 98.7°F | Ht 65.25 in | Wt 175.0 lb

## 2012-11-09 DIAGNOSIS — D72829 Elevated white blood cell count, unspecified: Secondary | ICD-10-CM

## 2012-11-09 DIAGNOSIS — R21 Rash and other nonspecific skin eruption: Secondary | ICD-10-CM

## 2012-11-09 LAB — POCT CBC
Granulocyte percent: 50.2 %G (ref 37–80)
HCT, POC: 46.3 % (ref 37.7–47.9)
Hemoglobin: 15.6 g/dL (ref 12.2–16.2)
Lymph, poc: 4.5 — AB (ref 0.6–3.4)
MCH, POC: 30.9 pg (ref 27–31.2)
MCHC: 33.8 g/dL (ref 31.8–35.4)
MCV: 91.5 fL (ref 80–97)
MPV: 8.6 fL (ref 0–99.8)
POC Granulocyte: 5.7 (ref 2–6.9)
POC LYMPH PERCENT: 39.7 %L (ref 10–50)
Platelet Count, POC: 183 10*3/uL (ref 142–424)
RBC: 5.1 M/uL (ref 4.04–5.48)
RDW, POC: 13 %
WBC: 11.4 10*3/uL — AB (ref 4.6–10.2)

## 2012-11-09 NOTE — Progress Notes (Signed)
  Subjective:    Patient ID: Madison [name], female    DOB: 24-Dec-1945, 67 y.o.   MRN: [id number]  HPI Patient comes in today describing a rash which has started about 2 weeks ago. It started on her upper left thigh. She now has areas over the sternum near her bra. She now has another area on her left lateral chest wall. The lesion on her thigh is not raised, it is not flushed, and is non-tender. These areas are not flaky and they are slightly darker in color than the rest of her skin. They do not itch.    Review of Systems  Constitutional: Negative for fever.  HENT: Negative for postnasal drip.   Respiratory: Negative for cough and shortness of breath.   Skin: Positive for rash (dry scaly patches on L leg and abd x 2 weeks).  Neurological: Positive for headaches.       Objective:   Physical Exam Body examined and the rash was apparent as noted in the history of present illness.       Assessment & Plan:   1. Rash and nonspecific skin eruption - POCT CBC - POCT SEDIMENTATION RATE  2. Leukocytosis, unspecified - POCT CBC  Patient Instructions  Use cortisone 10 sparingly over-the-counter 2-3 times a day to the affected areas of skin Take Zyrtec at night Stay cool Use non-scented soaps and fabric softners   Nyra Capes MD

## 2012-11-09 NOTE — Addendum Note (Signed)
Addended by: Lisbeth Ply C on: 11/09/2012 05:04 PM   Modules accepted: Orders

## 2012-11-09 NOTE — Patient Instructions (Signed)
Use cortisone 10 sparingly over-the-counter 2-3 times a day to the affected areas of skin Take Zyrtec at night Stay cool Use non-scented soaps and fabric softners

## 2012-11-10 LAB — SEDIMENTATION RATE: Sed Rate: 4 mm/hr (ref 0–22)

## 2012-12-14 ENCOUNTER — Other Ambulatory Visit: Payer: Self-pay

## 2012-12-14 DIAGNOSIS — Z1231 Encounter for screening mammogram for malignant neoplasm of breast: Secondary | ICD-10-CM

## 2012-12-29 ENCOUNTER — Ambulatory Visit (HOSPITAL_COMMUNITY): Payer: Medicare Other | Admitting: Oncology

## 2012-12-29 ENCOUNTER — Ambulatory Visit (HOSPITAL_COMMUNITY): Payer: Medicare Other

## 2013-01-04 ENCOUNTER — Ambulatory Visit (INDEPENDENT_AMBULATORY_CARE_PROVIDER_SITE_OTHER): Payer: Medicare Other | Admitting: Family Medicine

## 2013-01-04 ENCOUNTER — Encounter: Payer: Self-pay | Admitting: Family Medicine

## 2013-01-04 VITALS — BP 157/86 | HR 68 | Temp 96.9°F | Ht 62.5 in | Wt 170.0 lb

## 2013-01-04 DIAGNOSIS — M810 Age-related osteoporosis without current pathological fracture: Secondary | ICD-10-CM

## 2013-01-04 DIAGNOSIS — E785 Hyperlipidemia, unspecified: Secondary | ICD-10-CM

## 2013-01-04 DIAGNOSIS — D72829 Elevated white blood cell count, unspecified: Secondary | ICD-10-CM | POA: Insufficient documentation

## 2013-01-04 DIAGNOSIS — L821 Other seborrheic keratosis: Secondary | ICD-10-CM

## 2013-01-04 DIAGNOSIS — I10 Essential (primary) hypertension: Secondary | ICD-10-CM

## 2013-01-04 HISTORY — DX: Elevated white blood cell count, unspecified: D72.829

## 2013-01-04 LAB — POCT CBC
Granulocyte percent: 54.9 %G (ref 37–80)
HCT, POC: 49.5 % — AB (ref 37.7–47.9)
Hemoglobin: 16.8 g/dL — AB (ref 12.2–16.2)
Lymph, poc: 4.7 — AB (ref 0.6–3.4)
MCH, POC: 31.4 pg — AB (ref 27–31.2)
MCHC: 34 g/dL (ref 31.8–35.4)
MCV: 92.3 fL (ref 80–97)
MPV: 9.4 fL (ref 0–99.8)
POC Granulocyte: 6.8 (ref 2–6.9)
POC LYMPH PERCENT: 38.1 %L (ref 10–50)
Platelet Count, POC: 200 10*3/uL (ref 142–424)
RBC: 5.4 M/uL (ref 4.04–5.48)
RDW, POC: 12.5 %
WBC: 12.3 10*3/uL — AB (ref 4.6–10.2)

## 2013-01-04 MED ORDER — LOSARTAN POTASSIUM 100 MG PO TABS: 100.0000 mg | ORAL_TABLET | Freq: Every day | ORAL | Status: DC

## 2013-01-04 MED ORDER — NIACIN ER (ANTIHYPERLIPIDEMIC) 1000 MG PO TBCR: 1000.0000 mg | EXTENDED_RELEASE_TABLET | Freq: Every day | ORAL | Status: DC

## 2013-01-04 MED ORDER — LEVOTHYROXINE SODIUM 137 MCG PO TABS: 137.0000 ug | ORAL_TABLET | ORAL | Status: DC

## 2013-01-04 MED ORDER — METOPROLOL TARTRATE 50 MG PO TABS: 25.0000 mg | ORAL_TABLET | Freq: Two times a day (BID) | ORAL | Status: DC

## 2013-01-04 MED ORDER — ROSUVASTATIN CALCIUM 20 MG PO TABS: 20.0000 mg | ORAL_TABLET | Freq: Every day | ORAL | Status: DC

## 2013-01-04 NOTE — Patient Instructions (Signed)
continue all meds Fall precautions discussed Force fluids Stop Smoking Continue therapeutic life style changes- Diet and exercise Follow up in 3 months

## 2013-01-04 NOTE — Progress Notes (Signed)
Subjective:    Patient ID: Madison [name], female    DOB: 1945/09/16, 67 y.o.   MRN: [id number]  HPIPt here for follow up of chronic medical problems. Patient has no complaints today.  Specialist:  Cardiology- Dr [place] Lions  Endocrinologist- Dr. Nonie Hoyer- [facility])  Hematologist- [facility]  Patient Active Problem List   Diagnosis Date Noted  . Leukocytosis, unspecified 07/24/2012  . TOBACCO ABUSE 10/29/2009  . THYROID CANCER 11/30/2007  . hyperlipidemia 11/30/2007  . HYPERTENSION 11/30/2007  . CORONARY ARTERY DISEASE 11/30/2007  . IRRITABLE BOWEL SYNDROME 11/30/2007  . Osteopenia 11/30/2007    Outpatient Encounter Prescriptions as of 01/04/2013  Medication Sig Dispense Refill  . aspirin 325 MG tablet Take 325 mg by mouth daily.        . Diphenhydramine-APAP, sleep, (TYLENOL PM EXTRA STRENGTH PO) Take by mouth as needed.        . ergocalciferol (VITAMIN D2) 50000 UNITS capsule Take 1 capsule (50,000 Units total) by mouth once a week.  4 capsule  11  . levothyroxine (SYNTHROID, LEVOTHROID) 137 MCG tablet Take 137 mcg by mouth as directed. Take one tablet by mouth daily then take two tablets by mouth on Sunday .       . losartan (COZAAR) 100 MG tablet Take 100 mg by mouth daily.      . metoprolol (LOPRESSOR) 50 MG tablet Take 25 mg by mouth 2 (two) times daily. Take half tablet two times a day      . niacin (NIASPAN) 1000 MG CR tablet Take 1 tablet (1,000 mg total) by mouth at bedtime.  60 tablet  11  . rosuvastatin (CRESTOR) 20 MG tablet Take 20 mg by mouth daily.      . nitroGLYCERIN (NITROSTAT) 0.4 MG SL tablet Place 1 tablet (0.4 mg total) under the tongue every 5 (five) minutes as needed for chest pain.  90 tablet  12   No facility-administered encounter medications on file as of 01/04/2013.     Review of Systems  Constitutional: Negative.   HENT: Negative.   Eyes: Negative.   Respiratory: Negative.   Cardiovascular: Negative.   Gastrointestinal: Negative.        Ibs   Endocrine: Negative.   Genitourinary: Negative.   Musculoskeletal: Negative.   Skin: Negative.   Allergic/Immunologic: Negative.   Neurological: Negative.   Hematological: Negative.   Psychiatric/Behavioral: Negative.        Objective:   Physical Exam  Vitals reviewed. Constitutional: She is oriented to person, place, and time. She appears well-developed and well-nourished.  HENT:  Right Ear: External ear normal.  Left Ear: External ear normal.  Nose: Nose normal.  Mouth/Throat: Oropharynx is clear and moist.  Eyes: Conjunctivae and EOM are normal. Pupils are equal, round, and reactive to light.  Neck: Normal range of motion. Neck supple.  Cardiovascular: Normal rate, regular rhythm and normal heart sounds.   No murmur heard. Pulmonary/Chest: Effort normal and breath sounds normal.  Abdominal: Soft. Bowel sounds are normal.  Musculoskeletal: Normal range of motion.  Lymphadenopathy:    She has no cervical adenopathy.  Neurological: She is alert and oriented to person, place, and time. She has normal reflexes. She displays normal reflexes.  Skin: Skin is warm and dry.  Flesh colored warty lesion right forearm  Psychiatric: She has a normal mood and affect. Her behavior is normal. Judgment and thought content normal.    BP 157/86  Pulse 68  Temp(Src) 96.9 F (36.1 C) (Oral)  Ht 5' 2.5" (  1.588 m)  Wt 170 lb (77.111 kg)  BMI 30.58 kg/m2  Cryotherapy of multiple seborrheic keratosis on bil forearms     Assessment & Plan:   1. hyperlipidemia   2. HYPERTENSION   3. Osteopenia   4. Leukocytosis   5. Seborrheic keratoses    Orders Placed This Encounter  Procedures  . Hepatic function panel  . BMP8+EGFR  . NMR, lipoprofile  . POCT CBC     Medication List       This list is accurate as of: 01/04/13  8:38 AM.  Always use your most recent med list.               aspirin 325 MG tablet  Take 325 mg by mouth daily.     ergocalciferol 50000 UNITS capsule   Commonly known as:  VITAMIN D2  Take 1 capsule (50,000 Units total) by mouth once a week.     levothyroxine 137 MCG tablet  Commonly known as:  SYNTHROID, LEVOTHROID  Take 1 tablet (137 mcg total) by mouth as directed. Take one tablet by mouth daily then take two tablets by mouth on Sunday .     losartan 100 MG tablet  Commonly known as:  COZAAR  Take 1 tablet (100 mg total) by mouth daily.     metoprolol 50 MG tablet  Commonly known as:  LOPRESSOR  Take 0.5 tablets (25 mg total) by mouth 2 (two) times daily. Take half tablet two times a day     niacin 1000 MG CR tablet  Commonly known as:  NIASPAN  Take 1 tablet (1,000 mg total) by mouth at bedtime.     nitroGLYCERIN 0.4 MG SL tablet  Commonly known as:  NITROSTAT  Place 1 tablet (0.4 mg total) under the tongue every 5 (five) minutes as needed for chest pain.     rosuvastatin 20 MG tablet  Commonly known as:  CRESTOR  Take 1 tablet (20 mg total) by mouth daily.     TYLENOL PM EXTRA STRENGTH PO  Take by mouth as needed.      continue all meds Fall precautions discussed Force fluids Stop Smoking Continue therapeutic life style changes- Diet and exercise Follow up in 3 months  Nyra Capes MD

## 2013-01-05 ENCOUNTER — Ambulatory Visit
Admission: RE | Admit: 2013-01-05 | Discharge: 2013-01-05 | Disposition: A | Discharge status: Home / Self Care | Payer: Medicare Other | Source: Ambulatory Visit

## 2013-01-05 DIAGNOSIS — Z1231 Encounter for screening mammogram for malignant neoplasm of breast: Secondary | ICD-10-CM

## 2013-01-06 LAB — NMR, LIPOPROFILE
Cholesterol: 134 mg/dL (ref <200)
HDL Cholesterol by NMR: 35 mg/dL — ABNORMAL LOW (ref >=40)
HDL Particle Number: 28.5 umol/L — ABNORMAL LOW (ref >=30.5)
LDL Particle Number: 1257 nmol/L — ABNORMAL HIGH (ref <1000)
LDL Size: 20.7 nm (ref >20.5)
LDLC SERPL CALC-MCNC: 64 mg/dL (ref <100)
LP-IR Score: 72 — ABNORMAL HIGH (ref <=45)
Small LDL Particle Number: 592 nmol/L — ABNORMAL HIGH (ref <=527)
Triglycerides by NMR: 173 mg/dL — ABNORMAL HIGH (ref <150)

## 2013-01-06 LAB — BMP8+EGFR
BUN/Creatinine Ratio: 14 (ref 11–26)
BUN: 12 mg/dL (ref 8–27)
CO2: 20 mmol/L (ref 18–29)
Calcium: 9.9 mg/dL (ref 8.6–10.2)
Chloride: 98 mmol/L (ref 97–108)
Creatinine, Ser: 0.87 mg/dL (ref 0.57–1.00)
GFR calc Af Amer: 80 mL/min/{1.73_m2} (ref >59)
GFR calc non Af Amer: 70 mL/min/{1.73_m2} (ref >59)
Glucose: 99 mg/dL (ref 65–99)
Potassium: 4.6 mmol/L (ref 3.5–5.2)
Sodium: 140 mmol/L (ref 134–144)

## 2013-01-06 LAB — HEPATIC FUNCTION PANEL
ALT: 20 IU/L (ref 0–32)
AST: 19 IU/L (ref 0–40)
Albumin: 5 g/dL — ABNORMAL HIGH (ref 3.6–4.8)
Alkaline Phosphatase: 73 IU/L (ref 39–117)
Bilirubin, Direct: 0.07 mg/dL (ref 0.00–0.40)
Total Bilirubin: 0.3 mg/dL (ref 0.0–1.2)
Total Protein: 7.6 g/dL (ref 6.0–8.5)

## 2013-01-15 ENCOUNTER — Telehealth: Payer: Self-pay | Admitting: *Deleted

## 2013-01-15 NOTE — Telephone Encounter (Signed)
Message copied by Baltazar Apo on Mon Jan 15, 2013 10:38 AM ------      Message from: Ernestina Penna      Created: Sat Jan 06, 2013  8:21 PM       Liver function tests within normal limit, except the albumin is slightly increased      : The BMP all of the electrolytes were within normal limits and the blood sugar was good      On advanced lipid testing, a total LDL particle number was elevated at 1257. Previously it was 729. Triglycerides were elevated at 173 the HDL particle number was low. Small LDL particle number was elevated.------- the patient must do better with diet and exercise as she has showed and she can do this with her previous lab work so she must do better than she did this time.----Continue current treatment ------

## 2013-01-25 ENCOUNTER — Ambulatory Visit (INDEPENDENT_AMBULATORY_CARE_PROVIDER_SITE_OTHER): Payer: Medicare Other

## 2013-01-25 DIAGNOSIS — Z23 Encounter for immunization: Secondary | ICD-10-CM

## 2013-04-24 ENCOUNTER — Other Ambulatory Visit: Payer: Self-pay

## 2013-04-24 MED ORDER — ROSUVASTATIN CALCIUM 20 MG PO TABS: 20.0000 mg | ORAL_TABLET | Freq: Every day | ORAL | Status: DC

## 2013-05-15 ENCOUNTER — Ambulatory Visit (INDEPENDENT_AMBULATORY_CARE_PROVIDER_SITE_OTHER): Payer: Medicare Other | Admitting: Family Medicine

## 2013-05-15 ENCOUNTER — Encounter: Payer: Self-pay | Admitting: Family Medicine

## 2013-05-15 VITALS — BP 143/82 | HR 74 | Temp 98.0°F | Ht 62.5 in | Wt 170.0 lb

## 2013-05-15 DIAGNOSIS — I10 Essential (primary) hypertension: Secondary | ICD-10-CM

## 2013-05-15 DIAGNOSIS — E8881 Metabolic syndrome: Secondary | ICD-10-CM

## 2013-05-15 DIAGNOSIS — E559 Vitamin D deficiency, unspecified: Secondary | ICD-10-CM

## 2013-05-15 DIAGNOSIS — E785 Hyperlipidemia, unspecified: Secondary | ICD-10-CM

## 2013-05-15 DIAGNOSIS — R7309 Other abnormal glucose: Secondary | ICD-10-CM

## 2013-05-15 DIAGNOSIS — Z23 Encounter for immunization: Secondary | ICD-10-CM

## 2013-05-15 DIAGNOSIS — I2510 Atherosclerotic heart disease of native coronary artery without angina pectoris: Secondary | ICD-10-CM

## 2013-05-15 LAB — POCT CBC
Granulocyte percent: 60.8 %G (ref 37–80)
HCT, POC: 48.2 % — AB (ref 37.7–47.9)
Hemoglobin: 17.1 g/dL — AB (ref 12.2–16.2)
Lymph, poc: 3.8 — AB (ref 0.6–3.4)
MCH, POC: 32.7 pg — AB (ref 27–31.2)
MCHC: 35.4 g/dL (ref 31.8–35.4)
MCV: 92.5 fL (ref 80–97)
MPV: 9.2 fL (ref 0–99.8)
POC Granulocyte: 6.7 (ref 2–6.9)
POC LYMPH PERCENT: 34.7 %L (ref 10–50)
Platelet Count, POC: 172 10*3/uL (ref 142–424)
RBC: 5.2 M/uL (ref 4.04–5.48)
RDW, POC: 12.9 %
WBC: 11 10*3/uL — AB (ref 4.6–10.2)

## 2013-05-15 LAB — POCT GLYCOSYLATED HEMOGLOBIN (HGB A1C): Hemoglobin A1C: 6

## 2013-05-15 NOTE — Patient Instructions (Addendum)
Continue current medications. Continue good therapeutic lifestyle changes which include good diet and exercise. Fall precautions discussed with patient. Schedule your flu vaccine if you haven't had it yet If you are over 67 years old - you may need Prevnar 40 or the adult Pneumonia vaccine. The Prevnar vaccine that you receive today may make your arm sore Use a cool mist humidifier in her bedroom at nighttime Use Mucinex, blue and white in color, maximum strength one twice daily if needed for cough and congestion Keep the house as cool as possible Return the FOBT

## 2013-05-15 NOTE — Addendum Note (Signed)
Addended by: Pollyann Kennedy F on: 05/15/2013 09:30 AM   Modules accepted: Orders

## 2013-05-15 NOTE — Addendum Note (Signed)
Addended by: Zannie Cove on: 05/15/2013 10:01 AM   Modules accepted: Orders

## 2013-05-15 NOTE — Progress Notes (Signed)
Subjective:    Patient ID: Madison [name], female    DOB: [birth year], 68 y.o.   MRN: [id number]  HPI Pt here for follow up and management of chronic medical problems. The patient just saw the endocrinologist and all of her lab work was stable as far as followup of her thyroid cancer. He will see her now on a yearly basis and will monitor her thyroid test. She indicates that her blood pressures consistently run in the 120s over the 70s at home. This is what the reading was when she visited the physician for her thyroid 128/76. She will get lab work today. She will receive the Prevnar vaccine today.       Patient Active Problem List   Diagnosis Date Noted  . Leukocytosis 01/04/2013  . TOBACCO ABUSE 10/29/2009  . THYROID CANCER 11/30/2007  . hyperlipidemia 11/30/2007  . HYPERTENSION 11/30/2007  . CORONARY ARTERY DISEASE 11/30/2007  . IRRITABLE BOWEL SYNDROME 11/30/2007  . Osteopenia 11/30/2007   Outpatient Encounter Prescriptions as of 05/15/2013  Medication Sig  . aspirin 325 MG tablet Take 325 mg by mouth daily.    . Diphenhydramine-APAP, sleep, (TYLENOL PM EXTRA STRENGTH PO) Take by mouth as needed.    . ergocalciferol (VITAMIN D2) 50000 UNITS capsule Take 1 capsule (50,000 Units total) by mouth once a week.  . levothyroxine (SYNTHROID, LEVOTHROID) 137 MCG tablet Take 137 mcg by mouth as directed. Take one tablet by mouth daily then take two tablets by mouth on Sunday .NAME BRAND ONLY!!!  . losartan (COZAAR) 100 MG tablet Take 1 tablet (100 mg total) by mouth daily.  . metoprolol (LOPRESSOR) 50 MG tablet Take 0.5 tablets (25 mg total) by mouth 2 (two) times daily. Take half tablet two times a day  . niacin (NIASPAN) 1000 MG CR tablet Take 1 tablet (1,000 mg total) by mouth at bedtime.  . rosuvastatin (CRESTOR) 20 MG tablet Take 1 tablet (20 mg total) by mouth daily.  . [DISCONTINUED] levothyroxine (SYNTHROID, LEVOTHROID) 137 MCG tablet Take 1 tablet (137 mcg total) by mouth as  directed. Take one tablet by mouth daily then take two tablets by mouth on Sunday .  Marland Kitchen nitroGLYCERIN (NITROSTAT) 0.4 MG SL tablet Place 1 tablet (0.4 mg total) under the tongue every 5 (five) minutes as needed for chest pain.    Review of Systems  Constitutional: Negative.   HENT: Negative.   Eyes: Negative.   Respiratory: Negative.   Cardiovascular: Negative.   Gastrointestinal: Negative.   Endocrine: Negative.   Genitourinary: Negative.   Musculoskeletal: Negative.   Skin: Negative.   Allergic/Immunologic: Negative.   Neurological: Negative.   Hematological: Negative.   Psychiatric/Behavioral: Negative.        Objective:   Physical Exam  Nursing note and vitals reviewed. Constitutional: She is oriented to person, place, and time. She appears well-developed and well-nourished. No distress.  HENT:  Head: Normocephalic and atraumatic.  Right Ear: External ear normal.  Left Ear: External ear normal.  Nose: Nose normal.  Mouth/Throat: Oropharynx is clear and moist. No oropharyngeal exudate.  Eyes: Conjunctivae and EOM are normal. Pupils are equal, round, and reactive to light. Right eye exhibits no discharge. Left eye exhibits no discharge. No scleral icterus.  Neck: Normal range of motion. Neck supple. No thyromegaly present.  No carotid bruits were auscultated  Cardiovascular: Normal rate, regular rhythm, normal heart sounds and intact distal pulses.  Exam reveals no gallop and no friction rub.   No murmur heard. At 59  per minute  Pulmonary/Chest: Effort normal and breath sounds normal. No respiratory distress. She has no wheezes. She has no rales. She exhibits no tenderness.  No axillary nodes  Abdominal: Soft. Bowel sounds are normal. She exhibits no mass. There is no tenderness. There is no rebound and no guarding.  No inguinal nodes  Musculoskeletal: Normal range of motion. She exhibits no edema and no tenderness.  Lymphadenopathy:    She has no cervical adenopathy.    Neurological: She is alert and oriented to person, place, and time. She has normal reflexes. No cranial nerve deficit.  Skin: Skin is warm and dry.  Skin is very dry as usual  Psychiatric: She has a normal mood and affect. Her behavior is normal. Judgment and thought content normal.   BP 143/82  Pulse 74  Temp(Src) 98 F (36.7 C) (Oral)  Ht 5' 2.5" (1.588 m)  Wt 170 lb (77.111 kg)  BMI 30.58 kg/m2        Assessment & Plan:  1. hyperlipidemia - POCT CBC - NMR, lipoprofile  2. HYPERTENSION - POCT CBC - BMP8+EGFR - Hepatic function panel  3. CORONARY ARTERY DISEASE - POCT CBC  4. Vitamin D deficiency - Vit D  25 hydroxy (rtn osteoporosis monitoring)  5. Metabolic syndrome -Hemoglobin A1c  Patient Instructions  Continue current medications. Continue good therapeutic lifestyle changes which include good diet and exercise. Fall precautions discussed with patient. Schedule your flu vaccine if you haven't had it yet If you are over 22 years old - you may need Prevnar 53 or the adult Pneumonia vaccine. The Prevnar vaccine that you receive today may make your arm sore Use a cool mist humidifier in her bedroom at nighttime Use Mucinex, blue and white in color, maximum strength one twice daily if needed for cough and congestion Keep the house as cool as possible Return the FOBT   Arrie Senate MD

## 2013-05-16 LAB — HEPATIC FUNCTION PANEL
ALT: 17 IU/L (ref 0–32)
AST: 16 IU/L (ref 0–40)
Albumin: 4.7 g/dL (ref 3.6–4.8)
Alkaline Phosphatase: 83 IU/L (ref 39–117)
Bilirubin, Direct: 0.06 mg/dL (ref 0.00–0.40)
Total Bilirubin: 0.2 mg/dL (ref 0.0–1.2)
Total Protein: 7.3 g/dL (ref 6.0–8.5)

## 2013-05-16 LAB — NMR, LIPOPROFILE
Cholesterol: 133 mg/dL (ref <200)
HDL Cholesterol by NMR: 37 mg/dL — ABNORMAL LOW (ref >=40)
HDL Particle Number: 26.9 umol/L — ABNORMAL LOW (ref >=30.5)
LDL Particle Number: 1427 nmol/L — ABNORMAL HIGH (ref <1000)
LDL Size: 20.5 nm — ABNORMAL LOW (ref >20.5)
LDLC SERPL CALC-MCNC: 67 mg/dL (ref <100)
LP-IR Score: 69 — ABNORMAL HIGH (ref <=45)
Small LDL Particle Number: 852 nmol/L — ABNORMAL HIGH (ref <=527)
Triglycerides by NMR: 146 mg/dL (ref <150)

## 2013-05-16 LAB — BMP8+EGFR
BUN/Creatinine Ratio: 16 (ref 11–26)
BUN: 13 mg/dL (ref 8–27)
CO2: 19 mmol/L (ref 18–29)
Calcium: 9.2 mg/dL (ref 8.7–10.3)
Chloride: 101 mmol/L (ref 97–108)
Creatinine, Ser: 0.8 mg/dL (ref 0.57–1.00)
GFR calc Af Amer: 89 mL/min/{1.73_m2} (ref >59)
GFR calc non Af Amer: 77 mL/min/{1.73_m2} (ref >59)
Glucose: 104 mg/dL — ABNORMAL HIGH (ref 65–99)
Potassium: 4.5 mmol/L (ref 3.5–5.2)
Sodium: 140 mmol/L (ref 134–144)

## 2013-05-16 LAB — VITAMIN D 25 HYDROXY (VIT D DEFICIENCY, FRACTURES): Vit D, 25-Hydroxy: 50.4 ng/mL (ref 30.0–100.0)

## 2013-08-29 ENCOUNTER — Encounter: Payer: Self-pay | Admitting: Cardiology

## 2013-08-29 ENCOUNTER — Ambulatory Visit (INDEPENDENT_AMBULATORY_CARE_PROVIDER_SITE_OTHER): Payer: Medicare Other | Admitting: Cardiology

## 2013-08-29 VITALS — BP 166/88 | HR 73 | Ht 65.0 in | Wt 172.0 lb

## 2013-08-29 DIAGNOSIS — I10 Essential (primary) hypertension: Secondary | ICD-10-CM

## 2013-08-29 DIAGNOSIS — I2510 Atherosclerotic heart disease of native coronary artery without angina pectoris: Secondary | ICD-10-CM

## 2013-08-29 NOTE — Patient Instructions (Signed)
The current medical regimen is effective;  continue present plan and medications.  Follow up in 1 year with Dr Hochrein.  You will receive a letter in the mail 2 months before you are due.  Please call us when you receive this letter to schedule your follow up appointment.  

## 2013-08-29 NOTE — Progress Notes (Signed)
HPI The patient returns for followup of CAD.  She had a stress test in January of last year with no evidence if ischemia.  Her EF was OK.  Since I last saw her she has done well.   The patient denies any new symptoms such as chest discomfort, neck or arm discomfort. There has been no new shortness of breath, PND or orthopnea. There have been no reported palpitations, presyncope or syncope.   She pushes a mower.  However, she does not exercise.  With her activities she does not have symptoms.  She continues to smoke and admits to not following a particularly heart healthy diet.  She reports that her activity is limited because she broke her left foot twice in the past.    Allergies  Allergen Reactions  . Banana     Throat and ears itch,ears feel like they swell  . Bupropion Hcl     REACTION: paranoia  . Chantix [Varenicline]     paranoia  . Zetia [Ezetimibe]     Myalgia    Current Outpatient Prescriptions  Medication Sig Dispense Refill  . aspirin 325 MG tablet Take 325 mg by mouth daily.        . Diphenhydramine-APAP, sleep, (TYLENOL PM EXTRA STRENGTH PO) Take by mouth as needed.        . ergocalciferol (VITAMIN D2) 50000 UNITS capsule Take 1 capsule (50,000 Units total) by mouth once a week.  4 capsule  11  . levothyroxine (SYNTHROID, LEVOTHROID) 137 MCG tablet Take 137 mcg by mouth as directed. Take one tablet by mouth daily then take two tablets by mouth on Sunday .NAME BRAND ONLY!!!      . losartan (COZAAR) 100 MG tablet Take 1 tablet (100 mg total) by mouth daily.  30 tablet  5  . metoprolol (LOPRESSOR) 50 MG tablet Take 0.5 tablets (25 mg total) by mouth 2 (two) times daily. Take half tablet two times a day  60 tablet  5  . niacin (NIASPAN) 1000 MG CR tablet Take 1 tablet (1,000 mg total) by mouth at bedtime.  60 tablet  11  . rosuvastatin (CRESTOR) 20 MG tablet Take 1 tablet (20 mg total) by mouth daily.  30 tablet  2  . nitroGLYCERIN (NITROSTAT) 0.4 MG SL tablet Place 1 tablet  (0.4 mg total) under the tongue every 5 (five) minutes as needed for chest pain.  90 tablet  12   No current facility-administered medications for this visit.    Past Medical History  Diagnosis Date  . Hypertension   . Hyperlipidemia   . Osteoporosis   . Thyroid cancer 2004    Dr. Clovia Cuff   . Diverticulosis   . IBS (irritable bowel syndrome)   . Tobacco abuse   . Need for prophylactic hormone replacement therapy (postmenopausal)   . CAD (coronary artery disease)     PCI of an occluded right coronary artery 2001. 70% LAD stenosis, 30% circumflex stenosis.  . Thyroid nodule 2004  . Osteopenia   . Leukocytosis, unspecified 07/24/2012    Past Surgical History  Procedure Laterality Date  . Appendectomy  2002  . Back surgery  06/2000  . Cholecystectomy  07/2002  . Thyroidectomy Bilateral 02/2003  . Lipoma excision Right 08/2003    ROS:  As stated in the HPI and negative for all other systems.  PHYSICAL EXAM BP 166/88  Pulse 73  Ht 5\' 5"  (1.651 m)  Wt 172 lb (78.019 kg)  BMI 28.62 kg/m2 GENERAL:  Well appearing HEENT:  Pupils equal round and reactive, fundi not visualized, oral mucosa unremarkable NECK:  No jugular venous distention, waveform within normal limits, carotid upstroke brisk and symmetric, no bruits, no thyromegaly LYMPHATICS:  No cervical, inguinal adenopathy LUNGS:  Clear to auscultation bilaterally BACK:  No CVA tenderness CHEST:  Unremarkable HEART:  PMI not displaced or sustained,S1 and S2 within normal limits, no S3, no S4, no clicks, no rubs, no murmurs ABD:  Flat, positive bowel sounds normal in frequency in pitch, no bruits, no rebound, no guarding, no midline pulsatile mass, no hepatomegaly, no splenomegaly EXT:  2 plus pulses throughout, no edema, no cyanosis no clubbing SKIN:  No rashes no nodules NEURO:  Cranial nerves II through XII grossly intact, motor grossly intact throughout PSYCH:  Cognitively intact, oriented to person place and time  EKG:   Sinus rhythm, rate 68, axis within normal limits, intervals within normal limits, no acute ST-T wave changes.  08/29/2013   ASSESSMENT AND PLAN  CORONARY ARTERY DISEASE -  The patient has no new sypmtoms.  No further cardiovascular testing is indicated.  We will continue with aggressive risk reduction and meds as listed.  TOBACCO ABUSE -  We again discussed this.   She did not tolerate the patches.  DYSLIPIDEMIA -  This is followed by Dr. Laurance Flatten.  She did not tolerate Zetia.    HTN - Her BP at home is normal.  She will continue the current meds.   RISK REDUCTION - Her decreased activity, diet and continued smoking put her at high risk for future cardiovascular events.  We have discussed the critical role that lifestyle modification plays in this.

## 2013-09-17 ENCOUNTER — Ambulatory Visit (INDEPENDENT_AMBULATORY_CARE_PROVIDER_SITE_OTHER): Payer: Medicare Other | Admitting: Family Medicine

## 2013-09-17 ENCOUNTER — Encounter: Payer: Self-pay | Admitting: Family Medicine

## 2013-09-17 VITALS — BP 145/94 | HR 75 | Temp 98.2°F | Ht 65.0 in | Wt 167.0 lb

## 2013-09-17 DIAGNOSIS — I10 Essential (primary) hypertension: Secondary | ICD-10-CM

## 2013-09-17 DIAGNOSIS — M8580 Other specified disorders of bone density and structure, unspecified site: Secondary | ICD-10-CM

## 2013-09-17 DIAGNOSIS — E559 Vitamin D deficiency, unspecified: Secondary | ICD-10-CM

## 2013-09-17 DIAGNOSIS — M949 Disorder of cartilage, unspecified: Secondary | ICD-10-CM

## 2013-09-17 DIAGNOSIS — R062 Wheezing: Secondary | ICD-10-CM

## 2013-09-17 DIAGNOSIS — M899 Disorder of bone, unspecified: Secondary | ICD-10-CM

## 2013-09-17 DIAGNOSIS — D72829 Elevated white blood cell count, unspecified: Secondary | ICD-10-CM

## 2013-09-17 DIAGNOSIS — E039 Hypothyroidism, unspecified: Secondary | ICD-10-CM

## 2013-09-17 DIAGNOSIS — E785 Hyperlipidemia, unspecified: Secondary | ICD-10-CM

## 2013-09-17 DIAGNOSIS — F17200 Nicotine dependence, unspecified, uncomplicated: Secondary | ICD-10-CM

## 2013-09-17 DIAGNOSIS — I2510 Atherosclerotic heart disease of native coronary artery without angina pectoris: Secondary | ICD-10-CM

## 2013-09-17 DIAGNOSIS — E8881 Metabolic syndrome: Secondary | ICD-10-CM

## 2013-09-17 LAB — POCT CBC
Granulocyte percent: 63.4 %G (ref 37–80)
HCT, POC: 51 % — AB (ref 37.7–47.9)
Hemoglobin: 17 g/dL — AB (ref 12.2–16.2)
Lymph, poc: 4.3 — AB (ref 0.6–3.4)
MCH, POC: 31 pg (ref 27–31.2)
MCHC: 33.4 g/dL (ref 31.8–35.4)
MCV: 92.8 fL (ref 80–97)
MPV: 9.2 fL (ref 0–99.8)
POC Granulocyte: 7.7 — AB (ref 2–6.9)
POC LYMPH PERCENT: 35.4 %L (ref 10–50)
Platelet Count, POC: 176 10*3/uL (ref 142–424)
RBC: 5.5 M/uL — AB (ref 4.04–5.48)
RDW, POC: 12.8 %
WBC: 12.1 10*3/uL — AB (ref 4.6–10.2)

## 2013-09-17 LAB — POCT GLYCOSYLATED HEMOGLOBIN (HGB A1C): Hemoglobin A1C: 5.9

## 2013-09-17 NOTE — Patient Instructions (Addendum)
Continue current medications. Continue good therapeutic lifestyle changes which include good diet and exercise. Fall precautions discussed with patient. If an FOBT was given today- please return it to our front desk. If you are over 67 years old - you may need Prevnar 85 or the adult Pneumonia vaccine.  Use the inhaler as directed 2 puffs twice daily and rinse mouth after use Continue to try to stop smoking Keep followup appointments with Dr. Tod Persia and Dr. Percival Spanish as planned Please remember that your mammogram is due in September We will arrange for you to get a pelvic exam with Shelah Lewandowsky or Evelina Dun We will call you with your lab work once that information is available  Dr Josie Dixon- [facility] in [place]

## 2013-09-17 NOTE — Progress Notes (Signed)
   Subjective:    Patient ID: Madison [name], female    DOB: 08/29/1945, 68 y.o.   MRN: [id number]  HPI Patient comes in today for 4 month follow up on chronic medical conditions.    Review of Systems  Constitutional: Negative.   HENT: Negative.   Eyes: Negative.   Respiratory: Negative.   Cardiovascular: Negative.   Gastrointestinal: Negative.   Endocrine: Negative.   Genitourinary: Negative.   Musculoskeletal: Negative.   Skin: Negative.   Allergic/Immunologic: Negative.   Neurological: Negative.   Hematological: Negative.   Psychiatric/Behavioral: Negative.        Objective:   Physical Exam  Nursing note and vitals reviewed. Constitutional: She is oriented to person, place, and time. She appears well-developed and well-nourished. No distress.  HENT:  Head: Normocephalic and atraumatic.  Right Ear: External ear normal.  Left Ear: External ear normal.  Nose: Nose normal.  Mouth/Throat: Oropharynx is clear and moist.  Eyes: Conjunctivae and EOM are normal. Pupils are equal, round, and reactive to light. Right eye exhibits no discharge. Left eye exhibits no discharge. No scleral icterus.  Neck: Normal range of motion. Neck supple. No thyromegaly present.  No thyroid fullness no adenopathy  Cardiovascular: Normal rate, regular rhythm, normal heart sounds and intact distal pulses.  Exam reveals no gallop and no friction rub.   No murmur heard.  At 72 per minute  Pulmonary/Chest: Effort normal. No respiratory distress. She has wheezes. She has no rales. She exhibits no tenderness.  Abdominal: Soft. Bowel sounds are normal. She exhibits no mass. There is no tenderness. There is no rebound and no guarding.  Musculoskeletal: Normal range of motion. She exhibits no edema and no tenderness.  Lymphadenopathy:    She has no cervical adenopathy.  Neurological: She is alert and oriented to person, place, and time. She has normal reflexes. No cranial nerve deficit.  Skin: Skin is  warm and dry. No rash noted.  Psychiatric: She has a normal mood and affect. Her behavior is normal. Judgment and thought content normal.    BP 166/89  Pulse 75  Temp(Src) 98.2 F (36.8 C) (Oral)  Ht $R'5\' 5"'Ia$  (1.651 m)  Wt 167 lb (75.751 kg)  BMI 27.79 kg/m2       Assessment & Plan:  1. Vitamin D deficiency - Vit D  25 hydroxy (rtn osteoporosis monitoring)  2. Hyperlipemia - Hepatic function panel - NMR, lipoprofile  3. Osteopenia  4. Hypertension - BMP8+EGFR  5. hyperlipidemia  6. CORONARY ARTERY DISEASE  7. Metabolic syndrome - POCT glycosylated hemoglobin (Hb A1C)  8. TOBACCO ABUSE  9. Leukocytosis - POCT CBC  10. Hypothyroidism - Thyroid Panel With TSH  11. Wheezing Patient Instructions  Continue current medications. Continue good therapeutic lifestyle changes which include good diet and exercise. Fall precautions discussed with patient. If an FOBT was given today- please return it to our front desk. If you are over 76 years old - you may need Prevnar 10 or the adult Pneumonia vaccine.  Use the inhaler as directed 2 puffs twice daily and rinse mouth after use Continue to try to stop smoking Keep followup appointments with Dr. Tod [name] and Dr. Percival [name] as planned Please remember that your mammogram is due in September We will arrange for you to get a pelvic exam with Madison [name] We will call you with your lab work once that information is available   Madison Senate MD

## 2013-09-18 LAB — BMP8+EGFR
BUN/Creatinine Ratio: 16 (ref 11–26)
BUN: 13 mg/dL (ref 8–27)
CO2: 19 mmol/L (ref 18–29)
Calcium: 10.1 mg/dL (ref 8.7–10.3)
Chloride: 102 mmol/L (ref 97–108)
Creatinine, Ser: 0.8 mg/dL (ref 0.57–1.00)
GFR calc Af Amer: 89 mL/min/{1.73_m2} (ref >59)
GFR calc non Af Amer: 77 mL/min/{1.73_m2} (ref >59)
Glucose: 113 mg/dL — ABNORMAL HIGH (ref 65–99)
Potassium: 4.6 mmol/L (ref 3.5–5.2)
Sodium: 140 mmol/L (ref 134–144)

## 2013-09-18 LAB — HEPATIC FUNCTION PANEL
ALT: 23 IU/L (ref 0–32)
AST: 17 IU/L (ref 0–40)
Albumin: 4.9 g/dL — ABNORMAL HIGH (ref 3.6–4.8)
Alkaline Phosphatase: 88 IU/L (ref 39–117)
Bilirubin, Direct: 0.08 mg/dL (ref 0.00–0.40)
Total Bilirubin: 0.3 mg/dL (ref 0.0–1.2)
Total Protein: 7.4 g/dL (ref 6.0–8.5)

## 2013-09-18 LAB — NMR, LIPOPROFILE
Cholesterol: 128 mg/dL (ref 100–199)
HDL Cholesterol by NMR: 33 mg/dL — ABNORMAL LOW (ref >39)
Triglycerides by NMR: 182 mg/dL — ABNORMAL HIGH (ref 0–149)

## 2013-09-18 LAB — THYROID PANEL WITH TSH
Free Thyroxine Index: 3.7 (ref 1.2–4.9)
T3 Uptake Ratio: 29 % (ref 24–39)
T4, Total: 12.8 ug/dL — ABNORMAL HIGH (ref 4.5–12.0)
TSH: 0.056 u[IU]/mL — ABNORMAL LOW (ref 0.450–4.500)

## 2013-09-18 LAB — VITAMIN D 25 HYDROXY (VIT D DEFICIENCY, FRACTURES): Vit D, 25-Hydroxy: 43.9 ng/mL (ref 30.0–100.0)

## 2013-09-19 ENCOUNTER — Telehealth: Payer: Self-pay | Admitting: Family Medicine

## 2013-09-19 NOTE — Telephone Encounter (Signed)
Message copied by Cline Crock on Wed Sep 19, 2013  3:05 PM ------      Message from: Chipper Herb      Created: Tue Sep 18, 2013  2:29 PM       The blood sugar is elevated at 113. The creatinine, the most important kidney function test is within normal limits. The electrolytes including potassium are within normal limits.      1 a liver function test is slightly elevated, but this is okay.      We have advanced lipid testing, the LDL particle number was not available. The triglycerides were elevated at 182.LAB-------- lab, please confirm why the total LDL particle number was not available+++++      The vitamin D level was good at 33.9.      The TSH was low and the patient is being treated for her history of thyroid cancer.----- no change in treatment ------

## 2013-09-20 LAB — SPECIMEN STATUS REPORT

## 2013-11-15 ENCOUNTER — Other Ambulatory Visit: Payer: Self-pay | Admitting: Family Medicine

## 2013-12-25 ENCOUNTER — Other Ambulatory Visit: Payer: Self-pay | Admitting: Family Medicine

## 2013-12-28 ENCOUNTER — Other Ambulatory Visit: Payer: Self-pay

## 2013-12-28 DIAGNOSIS — Z1231 Encounter for screening mammogram for malignant neoplasm of breast: Secondary | ICD-10-CM

## 2014-01-08 ENCOUNTER — Ambulatory Visit
Admission: RE | Admit: 2014-01-08 | Discharge: 2014-01-08 | Disposition: A | Discharge status: Home / Self Care | Payer: Medicare Other | Source: Ambulatory Visit

## 2014-01-08 DIAGNOSIS — Z1231 Encounter for screening mammogram for malignant neoplasm of breast: Secondary | ICD-10-CM

## 2014-01-17 ENCOUNTER — Telehealth: Payer: Self-pay

## 2014-01-17 ENCOUNTER — Ambulatory Visit (INDEPENDENT_AMBULATORY_CARE_PROVIDER_SITE_OTHER): Payer: Medicare Other

## 2014-01-17 ENCOUNTER — Encounter: Payer: Self-pay | Admitting: Family Medicine

## 2014-01-17 ENCOUNTER — Ambulatory Visit (INDEPENDENT_AMBULATORY_CARE_PROVIDER_SITE_OTHER): Payer: Medicare Other | Admitting: Family Medicine

## 2014-01-17 VITALS — BP 166/89 | HR 74 | Temp 97.0°F | Ht 65.0 in | Wt 168.0 lb

## 2014-01-17 DIAGNOSIS — F17200 Nicotine dependence, unspecified, uncomplicated: Secondary | ICD-10-CM

## 2014-01-17 DIAGNOSIS — Z720 Tobacco use: Secondary | ICD-10-CM

## 2014-01-17 DIAGNOSIS — E8881 Metabolic syndrome: Secondary | ICD-10-CM

## 2014-01-17 DIAGNOSIS — E785 Hyperlipidemia, unspecified: Secondary | ICD-10-CM

## 2014-01-17 DIAGNOSIS — E559 Vitamin D deficiency, unspecified: Secondary | ICD-10-CM

## 2014-01-17 DIAGNOSIS — I10 Essential (primary) hypertension: Secondary | ICD-10-CM

## 2014-01-17 DIAGNOSIS — C73 Malignant neoplasm of thyroid gland: Secondary | ICD-10-CM

## 2014-01-17 LAB — POCT CBC
Granulocyte percent: 70 %G (ref 37–80)
HCT, POC: 53.4 % — AB (ref 37.7–47.9)
Hemoglobin: 17.1 g/dL — AB (ref 12.2–16.2)
Lymph, poc: 3.9 — AB (ref 0.6–3.4)
MCH, POC: 29.9 pg (ref 27–31.2)
MCHC: 32.1 g/dL (ref 31.8–35.4)
MCV: 93.1 fL (ref 80–97)
MPV: 9.5 fL (ref 0–99.8)
POC Granulocyte: 9.4 — AB (ref 2–6.9)
POC LYMPH PERCENT: 28.7 %L (ref 10–50)
Platelet Count, POC: 192 10*3/uL (ref 142–424)
RBC: 5.7 M/uL — AB (ref 4.04–5.48)
RDW, POC: 12.7 %
WBC: 13.5 10*3/uL — AB (ref 4.6–10.2)

## 2014-01-17 LAB — POCT GLYCOSYLATED HEMOGLOBIN (HGB A1C): Hemoglobin A1C: 5.7

## 2014-01-17 MED ORDER — LOSARTAN POTASSIUM 100 MG PO TABS: 100.0000 mg | ORAL_TABLET | Freq: Every day | ORAL | Status: DC

## 2014-01-17 MED ORDER — METOPROLOL TARTRATE 50 MG PO TABS: 25.0000 mg | ORAL_TABLET | Freq: Two times a day (BID) | ORAL | Status: DC

## 2014-01-17 MED ORDER — SYNTHROID 137 MCG PO TABS: ORAL_TABLET | ORAL | Status: DC

## 2014-01-17 MED ORDER — ERGOCALCIFEROL 1.25 MG (50000 UT) PO CAPS: 50000.0000 [IU] | ORAL_CAPSULE | ORAL | Status: DC

## 2014-01-17 MED ORDER — NITROGLYCERIN 0.4 MG SL SUBL: 0.4000 mg | SUBLINGUAL_TABLET | SUBLINGUAL | Status: DC | PRN

## 2014-01-17 MED ORDER — HYDROCHLOROTHIAZIDE 12.5 MG PO TABS: 12.5000 mg | ORAL_TABLET | Freq: Every day | ORAL | Status: DC

## 2014-01-17 MED ORDER — NIACIN ER (ANTIHYPERLIPIDEMIC) 1000 MG PO TBCR: 1000.0000 mg | EXTENDED_RELEASE_TABLET | Freq: Every day | ORAL | Status: DC

## 2014-01-17 MED ORDER — ROSUVASTATIN CALCIUM 40 MG PO TABS: 40.0000 mg | ORAL_TABLET | Freq: Every day | ORAL | Status: DC

## 2014-01-17 NOTE — Telephone Encounter (Signed)
Pt aware of CXR results.

## 2014-01-17 NOTE — Progress Notes (Signed)
Subjective:    Patient ID: Madison [name], female    DOB: 09-08-1945, 68 y.o.   MRN: [id number]  HPI Pt here for follow up and management of chronic medical problems.the patient is pleasant and appears to be doing wel she wants to wait on the flu shot. She is still smoking. She will be given FOBT to return. l. She is due to get lab work today. the patient indicates that she drinks 2 cups of coffee a day. She drinks one.cut daily. She also likes he had biscuits and sausage biscuits         Patient Active Problem List   Diagnosis Date Noted  . Metabolic syndrome 40/34/7425  . Leukocytosis 01/04/2013  . TOBACCO ABUSE 10/29/2009  . THYROID CANCER 11/30/2007  . Hyperlipidemia 11/30/2007  . HTN (hypertension) 11/30/2007  . CORONARY ARTERY DISEASE 11/30/2007  . IRRITABLE BOWEL SYNDROME 11/30/2007  . Osteopenia 11/30/2007   Outpatient Encounter Prescriptions as of 01/17/2014  Medication Sig  . aspirin 325 MG tablet Take 325 mg by mouth daily.    . Diphenhydramine-APAP, sleep, (TYLENOL PM EXTRA STRENGTH PO) Take by mouth as needed.    . ergocalciferol (VITAMIN D2) 50000 UNITS capsule Take 1 capsule (50,000 Units total) by mouth once a week.  . losartan (COZAAR) 100 MG tablet Take 1 tablet (100 mg total) by mouth daily.  . metoprolol (LOPRESSOR) 50 MG tablet Take 0.5 tablets (25 mg total) by mouth 2 (two) times daily. Take half tablet two times a day  . niacin (NIASPAN) 1000 MG CR tablet Take 1 tablet (1,000 mg total) by mouth at bedtime.  . rosuvastatin (CRESTOR) 20 MG tablet Take 1 tablet (20 mg total) by mouth daily.  Marland Kitchen SYNTHROID 137 MCG tablet TAKE 1 TABLET DAILY, THEN TAKE 2 TABLETSON SUNDAY  . [DISCONTINUED] levothyroxine (SYNTHROID, LEVOTHROID) 137 MCG tablet Take 137 mcg by mouth as directed. Take one tablet by mouth daily then take two tablets by mouth on Sunday .NAME BRAND ONLY!!!  . nitroGLYCERIN (NITROSTAT) 0.4 MG SL tablet Place 1 tablet (0.4 mg total) under the tongue every 5  (five) minutes as needed for chest pain.  . [DISCONTINUED] losartan (COZAAR) 50 MG tablet TAKE ONE (1) TABLET EACH DAY    Review of Systems  Constitutional: Negative.   HENT: Negative.   Eyes: Negative.   Respiratory: Negative.   Cardiovascular: Negative.   Gastrointestinal: Negative.   Endocrine: Negative.   Genitourinary: Negative.   Musculoskeletal: Negative.   Skin: Negative.   Allergic/Immunologic: Negative.   Neurological: Negative.   Hematological: Negative.   Psychiatric/Behavioral: Negative.        Objective:   Physical Exam  Nursing note and vitals reviewed. Constitutional: She is oriented to person, place, and time. She appears well-developed and well-nourished. No distress.  HENT:  Head: Normocephalic and atraumatic.  Right Ear: External ear normal.  Left Ear: External ear normal.  Mouth/Throat: Oropharynx is clear and moist. No oropharyngeal exudate.  Nasal congestion bilaterally, throat is clear  Eyes: Conjunctivae and EOM are normal. Pupils are equal, round, and reactive to light. Right eye exhibits no discharge. Left eye exhibits no discharge. No scleral icterus.  Neck: Normal range of motion. Neck supple. No thyromegaly present.  Cardiovascular: Normal rate, regular rhythm, normal heart sounds and intact distal pulses.  Exam reveals no gallop and no friction rub.   No murmur heard. At 72 per minute  Pulmonary/Chest: Effort normal and breath sounds normal. No respiratory distress. She has no wheezes. She  has no rales. She exhibits no tenderness.  Abdominal: Soft. Bowel sounds are normal. She exhibits no mass. There is no tenderness. There is no rebound and no guarding.  Musculoskeletal: Normal range of motion. She exhibits no edema and no tenderness.  Lymphadenopathy:    She has no cervical adenopathy.  Neurological: She is alert and oriented to person, place, and time. She has normal reflexes. No cranial nerve deficit.  Skin: Skin is warm and dry. No rash  noted.  Psychiatric: She has a normal mood and affect. Her behavior is normal. Judgment and thought content normal.   BP 166/89  Pulse 74  Temp(Src) 97 F (36.1 C) (Oral)  Ht 5' 5"  (1.651 m)  Wt 168 lb (76.204 kg)  BMI 27.96 kg/m2   [facility] reading (PRIMARY) by  Dr. Brunilda Payor x-ray-no active disease                                       Assessment & Plan:   1. Hyperlipidemia - POCT CBC - NMR, lipoprofile  2. Essential hypertension - POCT CBC - BMP8+EGFR - Hepatic function panel - DG Chest 2 View; Future - nitroGLYCERIN (NITROSTAT) 0.4 MG SL tablet; Place 1 tablet (0.4 mg total) under the tongue every 5 (five) minutes as needed for chest pain.  Dispense: 25 tablet; Refill: 3  3. Metabolic syndrome - POCT glycosylated hemoglobin (Hb A1C) - POCT CBC  4. Thyroid cancer - Thyroid Panel With TSH  5. Vitamin D deficiency - Vit D  25 hydroxy (rtn osteoporosis monitoring)  6. Smoker - DG Chest 2 View; Future -Continue to make efforts to try to stop smoking Patient Instructions                       Medicare Annual Wellness Visit  [place] [facility] and the medical providers at [facility] strive to bring you the best medical care.  In doing so we not only want to address your current medical conditions and concerns but also to detect new conditions early and prevent illness, disease and health-related problems.    Medicare offers a yearly Wellness Visit which allows our clinical staff to assess your need for preventative services including immunizations, lifestyle education, counseling to decrease risk of preventable diseases and screening for fall risk and other medical concerns.    This visit is provided free of charge (no copay) for all Medicare recipients. The clinical pharmacists at [facility] have begun to conduct these Wellness Visits which will also include a thorough review of all your medications.    As you primary medical  provider recommend that you make an appointment for your Annual Wellness Visit if you have not done so already this year.  You may set up this appointment before you leave today or you may call back [phone]) and schedule an appointment.  Please make sure when you call that you mention that you are scheduling your Annual Wellness Visit with the clinical pharmacist so that the appointment may be made for the proper length of time.     Continue current medications. Continue good therapeutic lifestyle changes which include good diet and exercise. Fall precautions discussed with patient. If an FOBT was given today- please return it to our front desk. If you are over 33 years old - you may need Prevnar 20 or the adult Pneumonia vaccine.  Flu  Shots will be available at our office starting mid- September. Please call and schedule a FLU CLINIC APPOINTMENT.   Watch sodium intake much more closely Better diet habits Drink more water Continue to try to stop smoking Take additional blood pressure.  HCTZ, 12.5 mg, 1 daily Bring home blood pressure readings to the office in 2-3 weeks and have nurse check blood pressure here. Also get BMP   Arrie Senate MD

## 2014-01-17 NOTE — Patient Instructions (Addendum)
Medicare Annual Wellness Visit  [facility] and the medical providers at [facility] strive to bring you the best medical care.  In doing so we not only want to address your current medical conditions and concerns but also to detect new conditions early and prevent illness, disease and health-related problems.    Medicare offers a yearly Wellness Visit which allows our clinical staff to assess your need for preventative services including immunizations, lifestyle education, counseling to decrease risk of preventable diseases and screening for fall risk and other medical concerns.    This visit is provided free of charge (no copay) for all Medicare recipients. The clinical pharmacists at [facility] have begun to conduct these Wellness Visits which will also include a thorough review of all your medications.    As you primary medical provider recommend that you make an appointment for your Annual Wellness Visit if you have not done so already this year.  You may set up this appointment before you leave today or you may call back [phone]) and schedule an appointment.  Please make sure when you call that you mention that you are scheduling your Annual Wellness Visit with the clinical pharmacist so that the appointment may be made for the proper length of time.     Continue current medications. Continue good therapeutic lifestyle changes which include good diet and exercise. Fall precautions discussed with patient. If an FOBT was given today- please return it to our front desk. If you are over 34 years old - you may need Prevnar 44 or the adult Pneumonia vaccine.  Flu Shots will be available at our office starting mid- September. Please call and schedule a FLU CLINIC APPOINTMENT.   Watch sodium intake much more closely Better diet habits Drink more water Continue to try to stop smoking Take additional blood pressure.  HCTZ, 12.5  mg, 1 daily Bring home blood pressure readings to the office in 2-3 weeks and have nurse check blood pressure here. Also get BMP

## 2014-01-17 NOTE — Telephone Encounter (Signed)
Message copied by Koren Bound on Thu Jan 17, 2014  1:49 PM ------      Message from: Chipper Herb      Created: Thu Jan 17, 2014 11:11 AM       As per radiology report ------

## 2014-01-18 LAB — NMR, LIPOPROFILE
Cholesterol: 131 mg/dL (ref 100–199)
HDL Cholesterol by NMR: 34 mg/dL — ABNORMAL LOW (ref >39)
HDL Particle Number: 26.6 umol/L — ABNORMAL LOW (ref >=30.5)
LDL Particle Number: 1079 nmol/L — ABNORMAL HIGH (ref <1000)
LDL Size: 20.2 nm (ref >20.5)
LDLC SERPL CALC-MCNC: 69 mg/dL (ref 0–99)
LP-IR Score: 76 — ABNORMAL HIGH (ref <=45)
Small LDL Particle Number: 624 nmol/L — ABNORMAL HIGH (ref <=527)
Triglycerides by NMR: 138 mg/dL (ref 0–149)

## 2014-01-18 LAB — BMP8+EGFR
BUN/Creatinine Ratio: 16 (ref 11–26)
BUN: 13 mg/dL (ref 8–27)
CO2: 21 mmol/L (ref 18–29)
Calcium: 9.3 mg/dL (ref 8.7–10.3)
Chloride: 102 mmol/L (ref 97–108)
Creatinine, Ser: 0.82 mg/dL (ref 0.57–1.00)
GFR calc Af Amer: 86 mL/min/{1.73_m2} (ref >59)
GFR calc non Af Amer: 74 mL/min/{1.73_m2} (ref >59)
Glucose: 104 mg/dL — ABNORMAL HIGH (ref 65–99)
Potassium: 4.6 mmol/L (ref 3.5–5.2)
Sodium: 142 mmol/L (ref 134–144)

## 2014-01-18 LAB — THYROID PANEL WITH TSH
Free Thyroxine Index: 3.6 (ref 1.2–4.9)
T3 Uptake Ratio: 30 % (ref 24–39)
T4, Total: 11.9 ug/dL (ref 4.5–12.0)
TSH: 0.061 u[IU]/mL — ABNORMAL LOW (ref 0.450–4.500)

## 2014-01-18 LAB — HEPATIC FUNCTION PANEL
ALT: 22 IU/L (ref 0–32)
AST: 18 IU/L (ref 0–40)
Albumin: 4.6 g/dL (ref 3.6–4.8)
Alkaline Phosphatase: 73 IU/L (ref 39–117)
Bilirubin, Direct: 0.07 mg/dL (ref 0.00–0.40)
Total Bilirubin: 0.3 mg/dL (ref 0.0–1.2)
Total Protein: 7.3 g/dL (ref 6.0–8.5)

## 2014-01-18 LAB — VITAMIN D 25 HYDROXY (VIT D DEFICIENCY, FRACTURES): Vit D, 25-Hydroxy: 50 ng/mL (ref 30.0–100.0)

## 2014-01-31 ENCOUNTER — Ambulatory Visit (INDEPENDENT_AMBULATORY_CARE_PROVIDER_SITE_OTHER): Payer: Medicare Other

## 2014-01-31 ENCOUNTER — Ambulatory Visit (INDEPENDENT_AMBULATORY_CARE_PROVIDER_SITE_OTHER): Payer: Medicare Other | Admitting: *Deleted

## 2014-01-31 VITALS — BP 128/75 | HR 68

## 2014-01-31 DIAGNOSIS — I10 Essential (primary) hypertension: Secondary | ICD-10-CM

## 2014-01-31 DIAGNOSIS — Z23 Encounter for immunization: Secondary | ICD-10-CM

## 2014-01-31 NOTE — Patient Instructions (Signed)

## 2014-01-31 NOTE — Progress Notes (Signed)
Patient ID: Madison [name], female   DOB: 1945-09-15, 68 y.o.   MRN: [id number] Patient came in today for a BP nurse check. Patients BP 128/75  Pulse 68 Patient aware that we will send this over to Dr. Laurance Flatten to review and if any changes needed to be made we would call her.

## 2014-05-09 DIAGNOSIS — E890 Postprocedural hypothyroidism: Secondary | ICD-10-CM | POA: Insufficient documentation

## 2014-05-09 DIAGNOSIS — C73 Malignant neoplasm of thyroid gland: Secondary | ICD-10-CM | POA: Diagnosis not present

## 2014-05-29 ENCOUNTER — Encounter: Payer: Self-pay | Admitting: Family Medicine

## 2014-05-29 ENCOUNTER — Ambulatory Visit (INDEPENDENT_AMBULATORY_CARE_PROVIDER_SITE_OTHER): Payer: Medicare Other | Admitting: Family Medicine

## 2014-05-29 VITALS — BP 154/72 | HR 71 | Temp 97.0°F | Ht 65.0 in | Wt 172.0 lb

## 2014-05-29 DIAGNOSIS — M545 Low back pain, unspecified: Secondary | ICD-10-CM

## 2014-05-29 DIAGNOSIS — I10 Essential (primary) hypertension: Secondary | ICD-10-CM | POA: Diagnosis not present

## 2014-05-29 DIAGNOSIS — E785 Hyperlipidemia, unspecified: Secondary | ICD-10-CM

## 2014-05-29 DIAGNOSIS — Z720 Tobacco use: Secondary | ICD-10-CM

## 2014-05-29 DIAGNOSIS — C73 Malignant neoplasm of thyroid gland: Secondary | ICD-10-CM | POA: Diagnosis not present

## 2014-05-29 DIAGNOSIS — E559 Vitamin D deficiency, unspecified: Secondary | ICD-10-CM | POA: Diagnosis not present

## 2014-05-29 DIAGNOSIS — F17200 Nicotine dependence, unspecified, uncomplicated: Secondary | ICD-10-CM

## 2014-05-29 DIAGNOSIS — E8881 Metabolic syndrome: Secondary | ICD-10-CM

## 2014-05-29 MED ORDER — METOPROLOL TARTRATE 50 MG PO TABS: 25.0000 mg | ORAL_TABLET | Freq: Two times a day (BID) | ORAL | Status: DC

## 2014-05-29 MED ORDER — LOSARTAN POTASSIUM 100 MG PO TABS: 100.0000 mg | ORAL_TABLET | Freq: Every day | ORAL | Status: DC

## 2014-05-29 MED ORDER — ERGOCALCIFEROL 1.25 MG (50000 UT) PO CAPS: 50000.0000 [IU] | ORAL_CAPSULE | ORAL | Status: DC

## 2014-05-29 MED ORDER — HYDROCHLOROTHIAZIDE 12.5 MG PO TABS: 12.5000 mg | ORAL_TABLET | Freq: Every day | ORAL | Status: DC

## 2014-05-29 MED ORDER — NIACIN ER (ANTIHYPERLIPIDEMIC) 1000 MG PO TBCR
1000.0000 mg | EXTENDED_RELEASE_TABLET | Freq: Every day | ORAL | Status: DC
Start: 2014-05-29 — End: 2015-02-18

## 2014-05-29 MED ORDER — ROSUVASTATIN CALCIUM 40 MG PO TABS: 40.0000 mg | ORAL_TABLET | Freq: Every day | ORAL | Status: DC

## 2014-05-29 MED ORDER — SYNTHROID 137 MCG PO TABS: 137.0000 ug | ORAL_TABLET | Freq: Every day | ORAL | Status: DC

## 2014-05-29 NOTE — Progress Notes (Signed)
Subjective:    Patient ID: Madison [name], female    DOB: April 28, 1945, 69 y.o.   MRN: [id number]  HPI Pt here for follow up and management of chronic medical problems which includes hypertension, hyperlipidemia, and hx of thyroid cancer. She is taking all medications regularly. The patient's lab work in November had a blood sugar that was slightly elevated at good creatinine normal liver function test and an LDL PE which was improved from the previous reading with this one being 1079 a good vitamin D and a TSH that was low which is where we want it to be. She sees her endocrinologist [facility] regularly. His name is Dr. Tod Persia. She sees him yearly. He is requesting thyroid test and 46 weeks. Today she is due to get her lab work but will have to return because she is not fasting she is also due to get a pelvic and Pap smear and this will be arranged for her. She is also past you on her FOBT and she will be given one to return. She is requesting refills on all her medicines. She does complain of back pain today. She has no other complaints. She has no chest pain no shortness of breath no problems with her stoma, and no problems with voiding. She also unfortunately continues to smoke and does not plan to stop. She does bring in home blood pressures for review and has a diary over the past 4 months and all the blood pressures are excellent. They will be scanned into the record.         Patient Active Problem List   Diagnosis Date Noted  . Metabolic syndrome 04/11/8249  . Leukocytosis 01/04/2013  . TOBACCO ABUSE 10/29/2009  . THYROID CANCER 11/30/2007  . Hyperlipidemia 11/30/2007  . HTN (hypertension) 11/30/2007  . CORONARY ARTERY DISEASE 11/30/2007  . IRRITABLE BOWEL SYNDROME 11/30/2007  . Osteopenia 11/30/2007   Outpatient Encounter Prescriptions as of 05/29/2014  Medication Sig  . aspirin 325 MG tablet Take 325 mg by mouth daily.    . Diphenhydramine-APAP, sleep, (TYLENOL PM EXTRA  STRENGTH PO) Take by mouth as needed.    . ergocalciferol (VITAMIN D2) 50000 UNITS capsule Take 1 capsule (50,000 Units total) by mouth once a week.  . hydrochlorothiazide (HYDRODIURIL) 12.5 MG tablet Take 1 tablet (12.5 mg total) by mouth daily.  Marland Kitchen losartan (COZAAR) 100 MG tablet Take 1 tablet (100 mg total) by mouth daily.  . metoprolol (LOPRESSOR) 50 MG tablet Take 0.5 tablets (25 mg total) by mouth 2 (two) times daily. Take half tablet two times a day  . niacin (NIASPAN) 1000 MG CR tablet Take 1 tablet (1,000 mg total) by mouth at bedtime.  . rosuvastatin (CRESTOR) 40 MG tablet Take 1 tablet (40 mg total) by mouth daily. As directed  . SYNTHROID 137 MCG tablet TAKE 1 TABLET DAILY, THEN TAKE 2 TABLETS ON SUNDAY (Patient taking differently: Take 137 mcg by mouth daily before breakfast. TAKE 1 TABLET DAILY)  . nitroGLYCERIN (NITROSTAT) 0.4 MG SL tablet Place 1 tablet (0.4 mg total) under the tongue every 5 (five) minutes as needed for chest pain. (Patient not taking: Reported on 05/29/2014)    Review of Systems  Constitutional: Negative.   HENT: Negative.   Eyes: Negative.   Respiratory: Negative.   Cardiovascular: Negative.   Gastrointestinal: Negative.   Endocrine: Negative.   Genitourinary: Negative.   Musculoskeletal: Positive for back pain.  Skin: Negative.   Allergic/Immunologic: Negative.   Neurological: Negative.  Hematological: Negative.   Psychiatric/Behavioral: Negative.        Objective:   Physical Exam  Constitutional: She is oriented to person, place, and time. She appears well-developed and well-nourished. No distress.  HENT:  Head: Normocephalic and atraumatic.  Right Ear: External ear normal.  Left Ear: External ear normal.  Mouth/Throat: Oropharynx is clear and moist. No oropharyngeal exudate.  Nasal congestion bilaterally  Eyes: Conjunctivae and EOM are normal. Pupils are equal, round, and reactive to light. Right eye exhibits no discharge. Left eye exhibits  no discharge. No scleral icterus.  Neck: Normal range of motion. Neck supple. No thyromegaly present.  There was no thyromegaly or thyroid nodules and there was no anterior cervical adenopathy.  Cardiovascular: Normal rate, regular rhythm, normal heart sounds and intact distal pulses.  Exam reveals no gallop and no friction rub.   No murmur heard. The heart had a regular rate and rhythm at 72/m  Pulmonary/Chest: Effort normal and breath sounds normal. No respiratory distress. She has no wheezes. She has no rales. She exhibits no tenderness.  Lungs were clear anteriorly and posteriorly  Abdominal: Soft. Bowel sounds are normal. She exhibits no mass. There is no tenderness. There is no rebound and no guarding.  Nontender without bruits  Musculoskeletal: Normal range of motion. She exhibits no edema or tenderness.  Leg raising was good bilaterally and hip abduction and abduction were good bilaterally without producing back pain  Lymphadenopathy:    She has no cervical adenopathy.  Neurological: She is alert and oriented to person, place, and time. She has normal reflexes. No cranial nerve deficit.  Skin: Skin is warm and dry.  Psychiatric: She has a normal mood and affect. Her behavior is normal. Judgment and thought content normal.  Nursing note and vitals reviewed.  BP 154/72 mmHg  Pulse 71  Temp(Src) 97 F (36.1 C) (Oral)  Ht 5' 5"  (1.651 m)  Wt 172 lb (78.019 kg)  BMI 28.62 kg/m2        Assessment & Plan:  1. Essential hypertension -Continue current medicine. -Blood pressures at home were reviewed for the past 4 months and all these readings were excellent. She should continue current treatment based on these readings. - POCT CBC; Future - BMP8+EGFR; Future - Hepatic function panel; Future  2. Hyperlipidemia -Recent cholesterol numbers were reviewed and they were good and she should continue current treatment and any changes in medication will be addressed when the next lab  work is done. - POCT CBC; Future - NMR, lipoprofile; Future  3. Metabolic syndrome -Continue exercise regimen of walking and continue to watch diet and continuing to work on getting the  weight down as low as possible. - POCT CBC; Future - BMP8+EGFR; Future  4. Thyroid cancer -Continue yearly follow-up with Dr. Tod Persia and return to this office for the lab work that we're requesting and that he is requesting at an appropriate time in the future when he wanted this done. - POCT CBC; Future  5. Vitamin D deficiency -The recent vitamin D level was good, continue current treatment - POCT CBC; Future - Vit D  25 hydroxy (rtn osteoporosis monitoring); Future  6. TOBACCO ABUSE -Continue to make all efforts to try to stop smoking completely  7. Midline low back pain without sciatica -Try taking Aleve 1 twice daily after breakfast and supper for 7-10 days to see if this relieves back pain -If the back pain is not relieved with taking Aleve please call the nurse and arrange  to come back and get LS spine films  Meds ordered this encounter  Medications  . SYNTHROID 137 MCG tablet    Sig: Take 1 tablet (137 mcg total) by mouth daily before breakfast. TAKE 1 TABLET DAILY    Dispense:  34 tablet    Refill:  6  . rosuvastatin (CRESTOR) 40 MG tablet    Sig: Take 1 tablet (40 mg total) by mouth daily. As directed    Dispense:  30 tablet    Refill:  6  . niacin (NIASPAN) 1000 MG CR tablet    Sig: Take 1 tablet (1,000 mg total) by mouth at bedtime.    Dispense:  30 tablet    Refill:  6  . metoprolol (LOPRESSOR) 50 MG tablet    Sig: Take 0.5 tablets (25 mg total) by mouth 2 (two) times daily. Take half tablet two times a day    Dispense:  30 tablet    Refill:  6  . losartan (COZAAR) 100 MG tablet    Sig: Take 1 tablet (100 mg total) by mouth daily.    Dispense:  30 tablet    Refill:  6  . hydrochlorothiazide (HYDRODIURIL) 12.5 MG tablet    Sig: Take 1 tablet (12.5 mg total) by mouth daily.     Dispense:  30 tablet    Refill:  6  . ergocalciferol (VITAMIN D2) 50000 UNITS capsule    Sig: Take 1 capsule (50,000 Units total) by mouth once a week.    Dispense:  4 capsule    Refill:  3   Patient Instructions                       Medicare Annual Wellness Visit  [facility] and the medical providers at [facility] strive to bring you the best medical care.  In doing so we not only want to address your current medical conditions and concerns but also to detect new conditions early and prevent illness, disease and health-related problems.    Medicare offers a yearly Wellness Visit which allows our clinical staff to assess your need for preventative services including immunizations, lifestyle education, counseling to decrease risk of preventable diseases and screening for fall risk and other medical concerns.    This visit is provided free of charge (no copay) for all Medicare recipients. The clinical pharmacists at [facility] have begun to conduct these Wellness Visits which will also include a thorough review of all your medications.    As you primary medical provider recommend that you make an appointment for your Annual Wellness Visit if you have not done so already this year.  You may set up this appointment before you leave today or you may call back ([phone]) and schedule an appointment.  Please make sure when you call that you mention that you are scheduling your Annual Wellness Visit with the clinical pharmacist so that the appointment may be made for the proper length of time.     Continue current medications. Continue good therapeutic lifestyle changes which include good diet and exercise. Fall precautions discussed with patient. If an FOBT was given today- please return it to our front desk. If you are over 17 years old - you may need Prevnar 40 or the adult Pneumonia vaccine.  Flu Shots are still available at our office. If  you still haven't had one please call to set up a nurse visit to get one.   After  your visit with [place] today you will receive a survey in the mail or online from [employer] regarding your care with [place]. Please take a moment to fill this out. Your feedback is very important to [place] as you can help [place] better understand your patient needs as well as improve your experience and satisfaction. WE CARE ABOUT YOU!!!   Try taking Aleve, over-the-counter, 1 twice daily after breakfast and supper for 7-10 days to see if this may help relieve some of your back pain. If the back pain continues beyond a couple weeks, please call the nurse and come back and get LS spine films Also try to do better with your sitting position on the couch and if this sitting position aggravates her back do not sit on the couch but sit in a chair. Continue to walk regularly as this can help back pain For the rest of the winter, other than trying to quit smoking, always practice good pulmonary hygiene which means keep the house cooler, use a cool mist humidifier, and drink plenty of fluids. Also using nasal saline can help this. Continue yearly follow-up with endocrinologist. Don't forget to come back and get your pelvic exam. Return the FOBT   Arrie Senate MD

## 2014-05-29 NOTE — Patient Instructions (Addendum)
Medicare Annual Wellness Visit  [facility] and the medical providers at [facility] strive to bring you the best medical care.  In doing so we not only want to address your current medical conditions and concerns but also to detect new conditions early and prevent illness, disease and health-related problems.    Medicare offers a yearly Wellness Visit which allows our clinical staff to assess your need for preventative services including immunizations, lifestyle education, counseling to decrease risk of preventable diseases and screening for fall risk and other medical concerns.    This visit is provided free of charge (no copay) for all Medicare recipients. The clinical pharmacists at [facility] have begun to conduct these Wellness Visits which will also include a thorough review of all your medications.    As you primary medical provider recommend that you make an appointment for your Annual Wellness Visit if you have not done so already this year.  You may set up this appointment before you leave today or you may call back ([phone]) and schedule an appointment.  Please make sure when you call that you mention that you are scheduling your Annual Wellness Visit with the clinical pharmacist so that the appointment may be made for the proper length of time.     Continue current medications. Continue good therapeutic lifestyle changes which include good diet and exercise. Fall precautions discussed with patient. If an FOBT was given today- please return it to our front desk. If you are over 38 years old - you may need Prevnar 32 or the adult Pneumonia vaccine.  Flu Shots are still available at our office. If you still haven't had one please call to set up a nurse visit to get one.   After your visit with [place] today you will receive a survey in the mail or online from [employer] regarding your care with [place]. Please take a moment to  fill this out. Your feedback is very important to [place] as you can help [place] better understand your patient needs as well as improve your experience and satisfaction. WE CARE ABOUT YOU!!!   Try taking Aleve, over-the-counter, 1 twice daily after breakfast and supper for 7-10 days to see if this may help relieve some of your back pain. If the back pain continues beyond a couple weeks, please call the nurse and come back and get LS spine films Also try to do better with your sitting position on the couch and if this sitting position aggravates her back do not sit on the couch but sit in a chair. Continue to walk regularly as this can help back pain For the rest of the winter, other than trying to quit smoking, always practice good pulmonary hygiene which means keep the house cooler, use a cool mist humidifier, and drink plenty of fluids. Also using nasal saline can help this. Continue yearly follow-up with endocrinologist. Don't forget to come back and get your pelvic exam. Return the FOBT

## 2014-06-27 ENCOUNTER — Encounter: Payer: Self-pay | Admitting: Family

## 2014-06-27 ENCOUNTER — Ambulatory Visit (INDEPENDENT_AMBULATORY_CARE_PROVIDER_SITE_OTHER): Payer: Medicare Other | Admitting: Family

## 2014-06-27 VITALS — BP 143/88 | HR 80 | Temp 97.1°F | Ht 65.0 in | Wt 172.4 lb

## 2014-06-27 DIAGNOSIS — E785 Hyperlipidemia, unspecified: Secondary | ICD-10-CM | POA: Diagnosis not present

## 2014-06-27 DIAGNOSIS — C73 Malignant neoplasm of thyroid gland: Secondary | ICD-10-CM | POA: Diagnosis not present

## 2014-06-27 DIAGNOSIS — Z01419 Encounter for gynecological examination (general) (routine) without abnormal findings: Secondary | ICD-10-CM

## 2014-06-27 DIAGNOSIS — E559 Vitamin D deficiency, unspecified: Secondary | ICD-10-CM

## 2014-06-27 DIAGNOSIS — I10 Essential (primary) hypertension: Secondary | ICD-10-CM | POA: Diagnosis not present

## 2014-06-27 DIAGNOSIS — E8881 Metabolic syndrome: Secondary | ICD-10-CM

## 2014-06-27 LAB — POCT CBC
Granulocyte percent: 52.3 %G (ref 37–80)
HCT, POC: 53.6 % — AB (ref 37.7–47.9)
Hemoglobin: 16.4 g/dL — AB (ref 12.2–16.2)
Lymph, poc: 4.3 — AB (ref 0.6–3.4)
MCH, POC: 29 pg (ref 27–31.2)
MCHC: 30.6 g/dL — AB (ref 31.8–35.4)
MCV: 94.8 fL (ref 80–97)
MPV: 9 fL (ref 0–99.8)
POC Granulocyte: 5.5 (ref 2–6.9)
POC LYMPH PERCENT: 40.8 %L (ref 10–50)
Platelet Count, POC: 204 10*3/uL (ref 142–424)
RBC: 5.66 M/uL — AB (ref 4.04–5.48)
RDW, POC: 13.5 %
WBC: 10.5 10*3/uL — AB (ref 4.6–10.2)

## 2014-06-27 NOTE — Progress Notes (Signed)
   Subjective:    Patient ID: Madison [name], female    DOB: 1945-08-02, 69 y.o.   MRN: [id number]  HPI Pt present to the office for a pap. Pt is followed by Dr. Laurance Flatten every 4 months for her chronic problems. Pt denies any headache, palpitations, SOB, or edema at this time. Pt states she takes her BP at home and it is "always good". Pt states her avg BP at home is 120/70's.    Review of Systems  Constitutional: Negative.   HENT: Negative.   Eyes: Negative.   Respiratory: Negative.  Negative for shortness of breath.   Cardiovascular: Negative.  Negative for palpitations.  Gastrointestinal: Negative.   Endocrine: Negative.   Genitourinary: Negative.   Musculoskeletal: Negative.   Neurological: Negative.  Negative for headaches.  Hematological: Negative.   Psychiatric/Behavioral: Negative.   All other systems reviewed and are negative.      Objective:   Physical Exam  Constitutional: She is oriented to person, place, and time. She appears well-developed and well-nourished. No distress.  HENT:  Head: Normocephalic and atraumatic.  Right Ear: External ear normal.  Left Ear: External ear normal.  Nose: Nose normal.  Mouth/Throat: Oropharynx is clear and moist.  Eyes: Pupils are equal, round, and reactive to light.  Neck: Normal range of motion. Neck supple. No thyromegaly present.  Cardiovascular: Normal rate, regular rhythm, normal heart sounds and intact distal pulses.   No murmur heard. Pulmonary/Chest: Effort normal and breath sounds normal. No respiratory distress. She has no wheezes. Right breast exhibits no inverted nipple, no mass, no nipple discharge, no skin change and no tenderness. Left breast exhibits no inverted nipple, no mass, no nipple discharge, no skin change and no tenderness. Breasts are symmetrical.  Abdominal: Soft. Bowel sounds are normal. She exhibits no distension. There is no tenderness.  Genitourinary:  Bimanual exam- no adnexal masses or tenderness,  ovaries nonpalpable   Cervix parous and pink- No discharge   Musculoskeletal: Normal range of motion. She exhibits no edema or tenderness.  Neurological: She is alert and oriented to person, place, and time. She has normal reflexes. No cranial nerve deficit.  Skin: Skin is warm and dry.  Psychiatric: She has a normal mood and affect. Her behavior is normal. Judgment and thought content normal.  Vitals reviewed.   BP 143/88 mmHg  Pulse 80  Temp(Src) 97.1 F (36.2 C) (Oral)  Ht 5\' 5"  (1.651 m)  Wt 172 lb 6.4 oz (78.2 kg)  BMI 28.69 kg/m2       Assessment & Plan:  1. Encounter for routine gynecological examination -Keep all appointments with Dr. Laurance Flatten -Lab pending - Pap IG (Image Guided)  Evelina Dun, FNP

## 2014-06-27 NOTE — Patient Instructions (Signed)
Pap Test A Pap test is a procedure done in a clinic office to evaluate cells that are on the surface of the cervix. The cervix is the lower portion of the uterus and upper portion of the vagina. For some women, the cervical region has the potential to form cancer. With consistent evaluations by your caregiver, this type of cancer can be prevented.  If a Pap test is abnormal, it is most often a result of a previous exposure to human papillomavirus (HPV). HPV is a virus that can infect the cells of the cervix and cause dysplasia. Dysplasia is where the cells no longer look normal. If a woman has been diagnosed with high-grade or severe dysplasia, they are at higher risk of developing cervical cancer. People diagnosed with low-grade dysplasia should still be seen by their caregiver because there is a small chance that low-grade dysplasia could develop into cancer.  LET YOUR CAREGIVER KNOW ABOUT:  Recent sexually transmitted infection (STI) you have had.  Any new sex partners you have had.  History of previous abnormal Pap tests results.  History of previous cervical procedures you have had (colposcopy, biopsy, loop electrosurgical excision procedure [LEEP]).  Concerns you have had regarding unusual vaginal discharge.  History of pelvic pain.  Your use of birth control. BEFORE THE PROCEDURE  Ask your caregiver when to schedule your Pap test. It is best not to be on your period if your caregiver uses a wooden spatula to collect cells or applies cells to a glass slide. Newer techniques are not so sensitive to the timing of a menstrual cycle.  Do not douche or have sexual intercourse for 24 hours before the test.   Do not use vaginal creams or tampons for 24 hours before the test.   Empty your bladder just before the test to lessen any discomfort.  PROCEDURE You will lie on an exam table with your feet in stirrups. A warm metal or plastic instrument (speculum) is placed in your vagina. This  instrument allows your caregiver to see the inside of your vagina and look at your cervix. A small, plastic brush or wooden spatula is then used to collect cervical cells. These cells are placed in a lab specimen container. The cells are looked at under a microscope. A specialist will determine if the cells are normal.  AFTER THE PROCEDURE Make sure to get your test results.If your results come back abnormal, you may need further testing.  Document Released: 06/26/2002 Document Revised: 06/28/2011 Document Reviewed: 04/01/2011 Exit[employer] Patient Information 2015 [employer]. This information is not intended to replace advice given to you by your health care provider. Make sure you discuss any questions you have with your health care provider.  

## 2014-06-27 NOTE — Addendum Note (Signed)
Addended by: Earlene Plater on: 06/27/2014 09:15 AM   Modules accepted: Miquel Dunn

## 2014-06-27 NOTE — Addendum Note (Signed)
Addended by: Earlene Plater on: 06/27/2014 09:11 AM   Modules accepted: Orders

## 2014-06-28 LAB — NMR, LIPOPROFILE
Cholesterol: 129 mg/dL (ref 100–199)
HDL Cholesterol by NMR: 34 mg/dL — ABNORMAL LOW (ref >39)
HDL Particle Number: 27.7 umol/L — ABNORMAL LOW (ref >=30.5)
LDL Particle Number: 975 nmol/L (ref <1000)
LDL Size: 20.7 nm (ref >20.5)
LDL-C: 63 mg/dL (ref 0–99)
LP-IR Score: 82 — ABNORMAL HIGH (ref <=45)
Small LDL Particle Number: 434 nmol/L (ref <=527)
Triglycerides by NMR: 159 mg/dL — ABNORMAL HIGH (ref 0–149)

## 2014-06-28 LAB — HEPATIC FUNCTION PANEL
ALT: 18 IU/L (ref 0–32)
AST: 17 IU/L (ref 0–40)
Albumin: 4.6 g/dL (ref 3.6–4.8)
Alkaline Phosphatase: 67 IU/L (ref 39–117)
Bilirubin Total: 0.3 mg/dL (ref 0.0–1.2)
Bilirubin, Direct: 0.07 mg/dL (ref 0.00–0.40)
Total Protein: 7.3 g/dL (ref 6.0–8.5)

## 2014-06-28 LAB — BMP8+EGFR
BUN/Creatinine Ratio: 14 (ref 11–26)
BUN: 13 mg/dL (ref 8–27)
CO2: 21 mmol/L (ref 18–29)
Calcium: 9.4 mg/dL (ref 8.7–10.3)
Chloride: 100 mmol/L (ref 97–108)
Creatinine, Ser: 0.9 mg/dL (ref 0.57–1.00)
GFR calc Af Amer: 77 mL/min/{1.73_m2} (ref >59)
GFR calc non Af Amer: 66 mL/min/{1.73_m2} (ref >59)
Glucose: 99 mg/dL (ref 65–99)
Potassium: 4.9 mmol/L (ref 3.5–5.2)
Sodium: 138 mmol/L (ref 134–144)

## 2014-06-28 LAB — VITAMIN D 25 HYDROXY (VIT D DEFICIENCY, FRACTURES): Vit D, 25-Hydroxy: 47.4 ng/mL (ref 30.0–100.0)

## 2014-07-01 ENCOUNTER — Encounter: Payer: Self-pay | Admitting: Family Medicine

## 2014-07-01 LAB — PAP IG (IMAGE GUIDED): PAP Smear Comment: 0

## 2014-07-02 ENCOUNTER — Telehealth: Payer: Self-pay | Admitting: *Deleted

## 2014-07-02 NOTE — Telephone Encounter (Signed)
Message to please call for results.(normal pap)

## 2014-07-09 ENCOUNTER — Encounter: Payer: Self-pay | Admitting: Family

## 2014-07-10 DIAGNOSIS — H2513 Age-related nuclear cataract, bilateral: Secondary | ICD-10-CM | POA: Diagnosis not present

## 2014-08-15 ENCOUNTER — Ambulatory Visit: Payer: Medicare Other | Admitting: *Deleted

## 2014-08-22 ENCOUNTER — Ambulatory Visit (INDEPENDENT_AMBULATORY_CARE_PROVIDER_SITE_OTHER): Payer: Medicare Other

## 2014-08-22 ENCOUNTER — Encounter: Payer: Self-pay | Admitting: *Deleted

## 2014-08-22 ENCOUNTER — Ambulatory Visit (INDEPENDENT_AMBULATORY_CARE_PROVIDER_SITE_OTHER): Payer: Medicare Other | Admitting: *Deleted

## 2014-08-22 VITALS — BP 184/80 | HR 74 | Resp 20 | Ht 64.5 in | Wt 174.2 lb

## 2014-08-22 DIAGNOSIS — Z0000 Encounter for general adult medical examination without abnormal findings: Secondary | ICD-10-CM | POA: Diagnosis not present

## 2014-08-22 DIAGNOSIS — M810 Age-related osteoporosis without current pathological fracture: Secondary | ICD-10-CM

## 2014-08-22 NOTE — Patient Instructions (Signed)
Bone Densitometry Bone densitometry is a special X-ray that measures your bone density and can be used to help predict your risk of bone fractures. This test is used to determine bone mineral content and density to diagnose osteoporosis. Osteoporosis is the loss of bone that may cause the bone to become weak. Osteoporosis commonly occurs in women entering menopause. However, it may be found in men and in people with other diseases. PREPARATION FOR TEST No preparation necessary. WHO SHOULD BE TESTED?  All women older than 42.  Postmenopausal women (50 to 47) with risk factors for osteoporosis.  People with a previous fracture caused by normal activities.  People with a small body frame (less than 127 poundsor a body mass index [BMI] of less than 21).  People who have a parent with a hip fracture or history of osteoporosis.  People who smoke.  People who have rheumatoid arthritis.  Anyone who engages in excessive alcohol use (more than 3 drinks most days).  Women who experience early menopause. WHEN SHOULD YOU BE RETESTED? Current guidelines suggest that you should wait at least 2 years before doing a bone density test again if your first test was normal.Recent studies indicated that women with normal bone density may be able to wait a few years before needing to repeat a bone density test. You should discuss this with your caregiver.  NORMAL FINDINGS   Normal: less than standard deviation below normal (greater than -1).  Osteopenia: 1 to 2.5 standard deviations below normal (-1 to -2.5).  Osteoporosis: greater than 2.5 standard deviations below normal (less than -2.5). Test results are reported as a "T score" and a "Z score."The T score is a number that compares your bone density with the bone density of healthy, young women.The Z score is a number that compares your bone density with the scores of women who are the same age, gender, and race.  Ranges for normal findings may vary  among different laboratories and hospitals. You should always check with your doctor after having lab work or other tests done to discuss the meaning of your test results and whether your values are considered within normal limits. MEANING OF TEST  Your caregiver will go over the test results with you and discuss the importance and meaning of your results, as well as treatment options and the need for additional tests if necessary. OBTAINING THE TEST RESULTS It is your responsibility to obtain your test results. Ask the lab or department performing the test when and how you will get your results. Document Released: 04/27/2004 Document Revised: 06/28/2011 Document Reviewed: 05/20/2010 [facility] Patient Information 2015 [place], [place]. This information is not intended to replace advice given to you by your health care provider. Make sure you discuss any questions you have with your health care provider. DASH Eating Plan DASH stands for "Dietary Approaches to Stop Hypertension." The DASH eating plan is a healthy eating plan that has been shown to reduce high blood pressure (hypertension). Additional health benefits may include reducing the risk of type 2 diabetes mellitus, heart disease, and stroke. The DASH eating plan may also help with weight loss. WHAT DO I NEED TO KNOW ABOUT THE DASH EATING PLAN? For the DASH eating plan, you will follow these general guidelines:  Choose foods with a percent daily value for sodium of less than 5% (as listed on the food label).  Use salt-free seasonings or herbs instead of table salt or sea salt.  Check with your health care provider or  pharmacist before using salt substitutes.  Eat lower-sodium products, often labeled as "lower sodium" or "no salt added."  Eat fresh foods.  Eat more vegetables, fruits, and low-fat dairy products.  Choose whole grains. Look for the word "whole" as the first word in the ingredient list.  Choose fish and skinless chicken or  [place] more often than red meat. Limit fish, poultry, and meat to 6 oz (170 g) each day.  Limit sweets, desserts, sugars, and sugary drinks.  Choose heart-healthy fats.  Limit cheese to 1 oz (28 g) per day.  Eat more home-cooked food and less restaurant, buffet, and fast food.  Limit fried foods.  Cook foods using methods other than frying.  Limit canned vegetables. If you do use them, rinse them well to decrease the sodium.  When eating at a restaurant, ask that your food be prepared with less salt, or no salt if possible. WHAT FOODS CAN I EAT? Seek help from a dietitian for individual calorie needs. Grains Whole grain or whole wheat bread. Brown rice. Whole grain or whole wheat pasta. Quinoa, bulgur, and whole grain cereals. Low-sodium cereals. Corn or whole wheat flour tortillas. Whole grain cornbread. Whole grain crackers. Low-sodium crackers. Vegetables Fresh or frozen vegetables (raw, steamed, roasted, or grilled). Low-sodium or reduced-sodium tomato and vegetable juices. Low-sodium or reduced-sodium tomato sauce and paste. Low-sodium or reduced-sodium canned vegetables.  Fruits All fresh, canned (in natural juice), or frozen fruits. Meat and Other Protein Products Ground beef (85% or leaner), grass-fed beef, or beef trimmed of fat. Skinless chicken or [place]. Ground chicken or [place]. Pork trimmed of fat. All fish and seafood. Eggs. Dried beans, peas, or lentils. Unsalted nuts and seeds. Unsalted canned beans. Dairy Low-fat dairy products, such as skim or 1% milk, 2% or reduced-fat cheeses, low-fat ricotta or cottage cheese, or plain low-fat yogurt. Low-sodium or reduced-sodium cheeses. Fats and Oils Tub margarines without trans fats. Light or reduced-fat mayonnaise and salad dressings (reduced sodium). Avocado. Safflower, olive, or canola oils. Natural peanut or almond butter. Other Unsalted popcorn and pretzels. The items listed above may not be a complete list of  recommended foods or beverages. Contact your dietitian for more options. WHAT FOODS ARE NOT RECOMMENDED? Grains White bread. White pasta. White rice. Refined cornbread. Bagels and croissants. Crackers that contain trans fat. Vegetables Creamed or fried vegetables. Vegetables in a cheese sauce. Regular canned vegetables. Regular canned tomato sauce and paste. Regular tomato and vegetable juices. Fruits Dried fruits. Canned fruit in light or heavy syrup. Fruit juice. Meat and Other Protein Products Fatty cuts of meat. Ribs, chicken wings, bacon, sausage, bologna, salami, chitterlings, fatback, hot dogs, bratwurst, and packaged luncheon meats. Salted nuts and seeds. Canned beans with salt. Dairy Whole or 2% milk, cream, half-and-half, and cream cheese. Whole-fat or sweetened yogurt. Full-fat cheeses or blue cheese. Nondairy creamers and whipped toppings. Processed cheese, cheese spreads, or cheese curds. Condiments Onion and garlic salt, seasoned salt, table salt, and sea salt. Canned and packaged gravies. Worcestershire sauce. Tartar sauce. Barbecue sauce. Teriyaki sauce. Soy sauce, including reduced sodium. Steak sauce. Fish sauce. Oyster sauce. Cocktail sauce. Horseradish. Ketchup and mustard. Meat flavorings and tenderizers. Bouillon cubes. Hot sauce. Tabasco sauce. Marinades. Taco seasonings. Relishes. Fats and Oils Butter, stick margarine, lard, shortening, ghee, and bacon fat. Coconut, palm kernel, or palm oils. Regular salad dressings. Other Pickles and olives. Salted popcorn and pretzels. The items listed above may not be a complete list of foods and beverages to avoid. Contact your dietitian  for more information. WHERE CAN I FIND MORE INFORMATION? National Heart, Lung, [employer] Institute: travelstabloid.com Document Released: 03/25/2011 Document Revised: 08/20/2013 Document Reviewed: 02/07/2013 [facility] Patient Information 2015 [place], [place]. This  information is not intended to replace advice given to you by your health care provider. Make sure you discuss any questions you have with your health care provider. Preventive Care for Adults A healthy lifestyle and preventive care can promote health and wellness. Preventive health guidelines for women include the following key practices.  A routine yearly physical is a good way to check with your health care provider about your health and preventive screening. It is a chance to share any concerns and updates on your health and to receive a thorough exam.  Visit your dentist for a routine exam and preventive care every 6 months. Brush your teeth twice a day and floss once a day. Good oral hygiene prevents tooth decay and gum disease.  The frequency of eye exams is based on your age, health, family medical history, use of contact lenses, and other factors. Follow your health care provider's recommendations for frequency of eye exams.  Eat a healthy diet. Foods like vegetables, fruits, whole grains, low-fat dairy products, and lean protein foods contain the nutrients you need without too many calories. Decrease your intake of foods high in solid fats, added sugars, and salt. Eat the right amount of calories for you.Get information about a proper diet from your health care provider, if necessary.  Regular physical exercise is one of the most important things you can do for your health. Most adults should get at least 150 minutes of moderate-intensity exercise (any activity that increases your heart rate and causes you to sweat) each week. In addition, most adults need muscle-strengthening exercises on 2 or more days a week.  Maintain a healthy weight. The body mass index (BMI) is a screening tool to identify possible weight problems. It provides an estimate of body fat based on height and weight. Your health care provider can find your BMI and can help you achieve or maintain a healthy weight.For  adults 20 years and older:  A BMI below 18.5 is considered underweight.  A BMI of 18.5 to 24.9 is normal.  A BMI of 25 to 29.9 is considered overweight.  A BMI of 30 and above is considered obese.  Maintain normal blood lipids and cholesterol levels by exercising and minimizing your intake of saturated fat. Eat a balanced diet with plenty of fruit and vegetables. Blood tests for lipids and cholesterol should begin at age 80 and be repeated every 5 years. If your lipid or cholesterol levels are high, you are over 50, or you are at high risk for heart disease, you may need your cholesterol levels checked more frequently.Ongoing high lipid and cholesterol levels should be treated with medicines if diet and exercise are not working.  If you smoke, find out from your health care provider how to quit. If you do not use tobacco, do not start.  Lung cancer screening is recommended for adults aged 16-80 years who are at high risk for developing lung cancer because of a history of smoking. A yearly low-dose CT scan of the lungs is recommended for people who have at least a 30-pack-year history of smoking and are a current smoker or have quit within the past 15 years. A pack year of smoking is smoking an average of 1 pack of cigarettes a day for 1 year (for example: 1 pack a  day for 30 years or 2 packs a day for 15 years). Yearly screening should continue until the smoker has stopped smoking for at least 15 years. Yearly screening should be stopped for people who develop a health problem that would prevent them from having lung cancer treatment.  If you are pregnant, do not drink alcohol. If you are breastfeeding, be very cautious about drinking alcohol. If you are not pregnant and choose to drink alcohol, do not have more than 1 drink per day. One drink is considered to be 12 ounces (355 mL) of beer, 5 ounces (148 mL) of wine, or 1.5 ounces (44 mL) of liquor.  Avoid use of street drugs. Do not share needles  with anyone. Ask for help if you need support or instructions about stopping the use of drugs.  High blood pressure causes heart disease and increases the risk of stroke. Your blood pressure should be checked at least every 1 to 2 years. Ongoing high blood pressure should be treated with medicines if weight loss and exercise do not work.  If you are 42-45 years old, ask your health care provider if you should take aspirin to prevent strokes.  Diabetes screening involves taking a blood sample to check your fasting blood sugar level. This should be done once every 3 years, after age 85, if you are within normal weight and without risk factors for diabetes. Testing should be considered at a younger age or be carried out more frequently if you are overweight and have at least 1 risk factor for diabetes.  Breast cancer screening is essential preventive care for women. You should practice "breast self-awareness." This means understanding the normal appearance and feel of your breasts and may include breast self-examination. Any changes detected, no matter how small, should be reported to a health care provider. Women in their 26s and 30s should have a clinical breast exam (CBE) by a health care provider as part of a regular health exam every 1 to 3 years. After age 25, women should have a CBE every year. Starting at age 66, women should consider having a mammogram (breast X-ray test) every year. Women who have a family history of breast cancer should talk to their health care provider about genetic screening. Women at a high risk of breast cancer should talk to their health care providers about having an MRI and a mammogram every year.  Breast cancer gene (BRCA)-related cancer risk assessment is recommended for women who have family members with BRCA-related cancers. BRCA-related cancers include breast, ovarian, tubal, and peritoneal cancers. Having family members with these cancers may be associated with an  increased risk for harmful changes (mutations) in the breast cancer genes BRCA1 and BRCA2. Results of the assessment will determine the need for genetic counseling and BRCA1 and BRCA2 testing.  Routine pelvic exams to screen for cancer are no longer recommended for nonpregnant women who are considered low risk for cancer of the pelvic organs (ovaries, uterus, and vagina) and who do not have symptoms. Ask your health care provider if a screening pelvic exam is right for you.  If you have had past treatment for cervical cancer or a condition that could lead to cancer, you need Pap tests and screening for cancer for at least 20 years after your treatment. If Pap tests have been discontinued, your risk factors (such as having a new sexual partner) need to be reassessed to determine if screening should be resumed. Some women have medical problems that increase the  chance of getting cervical cancer. In these cases, your health care provider may recommend more frequent screening and Pap tests.  The HPV test is an additional test that may be used for cervical cancer screening. The HPV test looks for the virus that can cause the cell changes on the cervix. The cells collected during the Pap test can be tested for HPV. The HPV test could be used to screen women aged 23 years and older, and should be used in women of any age who have unclear Pap test results. After the age of 3, women should have HPV testing at the same frequency as a Pap test.  Colorectal cancer can be detected and often prevented. Most routine colorectal cancer screening begins at the age of 57 years and continues through age 47 years. However, your health care provider may recommend screening at an earlier age if you have risk factors for colon cancer. On a yearly basis, your health care provider may provide home test kits to check for hidden blood in the stool. Use of a small camera at the end of a tube, to directly examine the colon  (sigmoidoscopy or colonoscopy), can detect the earliest forms of colorectal cancer. Talk to your health care provider about this at age 46, when routine screening begins. Direct exam of the colon should be repeated every 5-10 years through age 47 years, unless early forms of pre-cancerous polyps or small growths are found.  People who are at an increased risk for hepatitis B should be screened for this virus. You are considered at high risk for hepatitis B if:  You were born in a country where hepatitis B occurs often. Talk with your health care provider about which countries are considered high risk.  Your parents were born in a high-risk country and you have not received a shot to protect against hepatitis B (hepatitis B vaccine).  You have HIV or AIDS.  You use needles to inject street drugs.  You live with, or have sex with, someone who has hepatitis B.  You get hemodialysis treatment.  You take certain medicines for conditions like cancer, organ transplantation, and autoimmune conditions.  Hepatitis C blood testing is recommended for all people born from [birth year] through 1964 and any individual with known risks for hepatitis C.  Practice safe sex. Use condoms and avoid high-risk sexual practices to reduce the spread of sexually transmitted infections (STIs). STIs include gonorrhea, chlamydia, syphilis, trichomonas, herpes, HPV, and human immunodeficiency virus (HIV). Herpes, HIV, and HPV are viral illnesses that have no cure. They can result in disability, cancer, and death.  You should be screened for sexually transmitted illnesses (STIs) including gonorrhea and chlamydia if:  You are sexually active and are younger than 24 years.  You are older than 24 years and your health care provider tells you that you are at risk for this type of infection.  Your sexual activity has changed since you were last screened and you are at an increased risk for chlamydia or gonorrhea. Ask your health  care provider if you are at risk.  If you are at risk of being infected with HIV, it is recommended that you take a prescription medicine daily to prevent HIV infection. This is called preexposure prophylaxis (PrEP). You are considered at risk if:  You are a heterosexual woman, are sexually active, and are at increased risk for HIV infection.  You take drugs by injection.  You are sexually active with a partner who has HIV.  Talk with your health care provider about whether you are at high risk of being infected with HIV. If you choose to begin PrEP, you should first be tested for HIV. You should then be tested every 3 months for as long as you are taking PrEP.  Osteoporosis is a disease in which the bones lose minerals and strength with aging. This can result in serious bone fractures or breaks. The risk of osteoporosis can be identified using a bone density scan. Women ages 65 years and over and women at risk for fractures or osteoporosis should discuss screening with their health care providers. Ask your health care provider whether you should take a calcium supplement or vitamin D to reduce the rate of osteoporosis.  Menopause can be associated with physical symptoms and risks. Hormone replacement therapy is available to decrease symptoms and risks. You should talk to your health care provider about whether hormone replacement therapy is right for you.  Use sunscreen. Apply sunscreen liberally and repeatedly throughout the day. You should seek shade when your shadow is shorter than you. Protect yourself by wearing long sleeves, pants, a wide-brimmed hat, and sunglasses year round, whenever you are outdoors.  Once a month, do a whole body skin exam, using a mirror to look at the skin on your back. Tell your health care provider of new moles, moles that have irregular borders, moles that are larger than a pencil eraser, or moles that have changed in shape or color.  Stay current with required  vaccines (immunizations).  Influenza vaccine. All adults should be immunized every year.  Tetanus, diphtheria, and acellular pertussis (Td, Tdap) vaccine. Pregnant women should receive 1 dose of Tdap vaccine during each pregnancy. The dose should be obtained regardless of the length of time since the last dose. Immunization is preferred during the 27th-36th week of gestation. An adult who has not previously received Tdap or who does not know her vaccine status should receive 1 dose of Tdap. This initial dose should be followed by tetanus and diphtheria toxoids (Td) booster doses every 10 years. Adults with an unknown or incomplete history of completing a 3-dose immunization series with Td-containing vaccines should begin or complete a primary immunization series including a Tdap dose. Adults should receive a Td booster every 10 years.  Varicella vaccine. An adult without evidence of immunity to varicella should receive 2 doses or a second dose if she has previously received 1 dose. Pregnant females who do not have evidence of immunity should receive the first dose after pregnancy. This first dose should be obtained before leaving the health care facility. The second dose should be obtained 4-8 weeks after the first dose.  Human papillomavirus (HPV) vaccine. Females aged 13-26 years who have not received the vaccine previously should obtain the 3-dose series. The vaccine is not recommended for use in pregnant females. However, pregnancy testing is not needed before receiving a dose. If a female is found to be pregnant after receiving a dose, no treatment is needed. In that case, the remaining doses should be delayed until after the pregnancy. Immunization is recommended for any person with an immunocompromised condition through the age of 26 years if she did not get any or all doses earlier. During the 3-dose series, the second dose should be obtained 4-8 weeks after the first dose. The third dose should be  obtained 24 weeks after the first dose and 16 weeks after the second dose.  Zoster vaccine. One dose is recommended for adults   aged 43 years or older unless certain conditions are present.  Measles, mumps, and rubella (MMR) vaccine. Adults born before [birth year] generally are considered immune to measles and mumps. Adults born in [birth year] or later should have 1 or more doses of MMR vaccine unless there is a contraindication to the vaccine or there is laboratory evidence of immunity to each of the three diseases. A routine second dose of MMR vaccine should be obtained at least 28 days after the first dose for students attending postsecondary schools, health care workers, or international travelers. People who received inactivated measles vaccine or an unknown type of measles vaccine during 1963-1967 should receive 2 doses of MMR vaccine. People who received inactivated mumps vaccine or an unknown type of mumps vaccine before 1979 and are at high risk for mumps infection should consider immunization with 2 doses of MMR vaccine. For females of childbearing age, rubella immunity should be determined. If there is no evidence of immunity, females who are not pregnant should be vaccinated. If there is no evidence of immunity, females who are pregnant should delay immunization until after pregnancy. Unvaccinated health care workers born before [birth year] who lack laboratory evidence of measles, mumps, or rubella immunity or laboratory confirmation of disease should consider measles and mumps immunization with 2 doses of MMR vaccine or rubella immunization with 1 dose of MMR vaccine.  Pneumococcal 13-valent conjugate (PCV13) vaccine. When indicated, a person who is uncertain of her immunization history and has no record of immunization should receive the PCV13 vaccine. An adult aged 30 years or older who has certain medical conditions and has not been previously immunized should receive 1 dose of PCV13 vaccine. This PCV13 should be  followed with a dose of pneumococcal polysaccharide (PPSV23) vaccine. The PPSV23 vaccine dose should be obtained at least 8 weeks after the dose of PCV13 vaccine. An adult aged 49 years or older who has certain medical conditions and previously received 1 or more doses of PPSV23 vaccine should receive 1 dose of PCV13. The PCV13 vaccine dose should be obtained 1 or more years after the last PPSV23 vaccine dose.  Pneumococcal polysaccharide (PPSV23) vaccine. When PCV13 is also indicated, PCV13 should be obtained first. All adults aged 48 years and older should be immunized. An adult younger than age 61 years who has certain medical conditions should be immunized. Any person who resides in a nursing home or long-term care facility should be immunized. An adult smoker should be immunized. People with an immunocompromised condition and certain other conditions should receive both PCV13 and PPSV23 vaccines. People with human immunodeficiency virus (HIV) infection should be immunized as soon as possible after diagnosis. Immunization during chemotherapy or radiation therapy should be avoided. Routine use of PPSV23 vaccine is not recommended for American Indians, [place] Natives, or people younger than 65 years unless there are medical conditions that require PPSV23 vaccine. When indicated, people who have unknown immunization and have no record of immunization should receive PPSV23 vaccine. One-time revaccination 5 years after the first dose of PPSV23 is recommended for people aged 19-64 years who have chronic kidney failure, nephrotic syndrome, asplenia, or immunocompromised conditions. People who received 1-2 doses of PPSV23 before age 33 years should receive another dose of PPSV23 vaccine at age 68 years or later if at least 5 years have passed since the previous dose. Doses of PPSV23 are not needed for people immunized with PPSV23 at or after age 71 years.  Meningococcal vaccine. Adults with asplenia or persistent  complement component  deficiencies should receive 2 doses of quadrivalent meningococcal conjugate (MenACWY-D) vaccine. The doses should be obtained at least 2 months apart. Microbiologists working with certain meningococcal bacteria, [place] recruits, people at risk during an outbreak, and people who travel to or live in countries with a high rate of meningitis should be immunized. A first-year college student up through age 88 years who is living in a residence hall should receive a dose if she did not receive a dose on or after her 16th birthday. Adults who have certain high-risk conditions should receive one or more doses of vaccine.  Hepatitis A vaccine. Adults who wish to be protected from this disease, have certain high-risk conditions, work with hepatitis A-infected animals, work in hepatitis A research labs, or travel to or work in countries with a high rate of hepatitis A should be immunized. Adults who were previously unvaccinated and who anticipate close contact with an international adoptee during the first 60 days after arrival in the [place] from a country with a high rate of hepatitis A should be immunized.  Hepatitis B vaccine. Adults who wish to be protected from this disease, have certain high-risk conditions, may be exposed to blood or other infectious body fluids, are household contacts or sex partners of hepatitis B positive people, are clients or workers in certain care facilities, or travel to or work in countries with a high rate of hepatitis B should be immunized.  Haemophilus influenzae type b (Hib) vaccine. A previously unvaccinated person with asplenia or sickle cell disease or having a scheduled splenectomy should receive 1 dose of Hib vaccine. Regardless of previous immunization, a recipient of a hematopoietic stem cell transplant should receive a 3-dose series 6-12 months after her successful transplant. Hib vaccine is not recommended for adults with HIV  infection. Preventive Services / Frequency Ages 82 to 45 years  Blood pressure check.** / Every 1 to 2 years.  Lipid and cholesterol check.** / Every 5 years beginning at age 9.  Clinical breast exam.** / Every 3 years for women in their 51s and 64s.  BRCA-related cancer risk assessment.** / For women who have family members with a BRCA-related cancer (breast, ovarian, tubal, or peritoneal cancers).  Pap test.** / Every 2 years from ages 68 through 38. Every 3 years starting at age 61 through age 72 or 68 with a history of 3 consecutive normal Pap tests.  HPV screening.** / Every 3 years from ages 10 through ages 57 to 33 with a history of 3 consecutive normal Pap tests.  Hepatitis C blood test.** / For any individual with known risks for hepatitis C.  Skin self-exam. / Monthly.  Influenza vaccine. / Every year.  Tetanus, diphtheria, and acellular pertussis (Tdap, Td) vaccine.** / Consult your health care provider. Pregnant women should receive 1 dose of Tdap vaccine during each pregnancy. 1 dose of Td every 10 years.  Varicella vaccine.** / Consult your health care provider. Pregnant females who do not have evidence of immunity should receive the first dose after pregnancy.  HPV vaccine. / 3 doses over 6 months, if 16 and younger. The vaccine is not recommended for use in pregnant females. However, pregnancy testing is not needed before receiving a dose.  Measles, mumps, rubella (MMR) vaccine.** / You need at least 1 dose of MMR if you were born in 1957 or later. You may also need a 2nd dose. For females of childbearing age, rubella immunity should be determined. If there is no evidence of immunity,  females who are not pregnant should be vaccinated. If there is no evidence of immunity, females who are pregnant should delay immunization until after pregnancy.  Pneumococcal 13-valent conjugate (PCV13) vaccine.** / Consult your health care provider.  Pneumococcal polysaccharide  (PPSV23) vaccine.** / 1 to 2 doses if you smoke cigarettes or if you have certain conditions.  Meningococcal vaccine.** / 1 dose if you are age 41 to 81 years and a Market researcher living in a residence hall, or have one of several medical conditions, you need to get vaccinated against meningococcal disease. You may also need additional booster doses.  Hepatitis A vaccine.** / Consult your health care provider.  Hepatitis B vaccine.** / Consult your health care provider.  Haemophilus influenzae type b (Hib) vaccine.** / Consult your health care provider. Ages 74 to 26 years  Blood pressure check.** / Every 1 to 2 years.  Lipid and cholesterol check.** / Every 5 years beginning at age 75 years.  Lung cancer screening. / Every year if you are aged 52-80 years and have a 30-pack-year history of smoking and currently smoke or have quit within the past 15 years. Yearly screening is stopped once you have quit smoking for at least 15 years or develop a health problem that would prevent you from having lung cancer treatment.  Clinical breast exam.** / Every year after age 46 years.  BRCA-related cancer risk assessment.** / For women who have family members with a BRCA-related cancer (breast, ovarian, tubal, or peritoneal cancers).  Mammogram.** / Every year beginning at age 14 years and continuing for as long as you are in good health. Consult with your health care provider.  Pap test.** / Every 3 years starting at age 33 years through age 41 or 48 years with a history of 3 consecutive normal Pap tests.  HPV screening.** / Every 3 years from ages 63 years through ages 71 to 74 years with a history of 3 consecutive normal Pap tests.  Fecal occult blood test (FOBT) of stool. / Every year beginning at age 77 years and continuing until age 66 years. You may not need to do this test if you get a colonoscopy every 10 years.  Flexible sigmoidoscopy or colonoscopy.** / Every 5 years for a  flexible sigmoidoscopy or every 10 years for a colonoscopy beginning at age 24 years and continuing until age 17 years.  Hepatitis C blood test.** / For all people born from [birth year] through 1964 and any individual with known risks for hepatitis C.  Skin self-exam. / Monthly.  Influenza vaccine. / Every year.  Tetanus, diphtheria, and acellular pertussis (Tdap/Td) vaccine.** / Consult your health care provider. Pregnant women should receive 1 dose of Tdap vaccine during each pregnancy. 1 dose of Td every 10 years.  Varicella vaccine.** / Consult your health care provider. Pregnant females who do not have evidence of immunity should receive the first dose after pregnancy.  Zoster vaccine.** / 1 dose for adults aged 57 years or older.  Measles, mumps, rubella (MMR) vaccine.** / You need at least 1 dose of MMR if you were born in 1957 or later. You may also need a 2nd dose. For females of childbearing age, rubella immunity should be determined. If there is no evidence of immunity, females who are not pregnant should be vaccinated. If there is no evidence of immunity, females who are pregnant should delay immunization until after pregnancy.  Pneumococcal 13-valent conjugate (PCV13) vaccine.** / Consult your health care provider.  Pneumococcal polysaccharide (  PPSV23) vaccine.** / 1 to 2 doses if you smoke cigarettes or if you have certain conditions.  Meningococcal vaccine.** / Consult your health care provider.  Hepatitis A vaccine.** / Consult your health care provider.  Hepatitis B vaccine.** / Consult your health care provider.  Haemophilus influenzae type b (Hib) vaccine.** / Consult your health care provider. Ages 65 years and over  Blood pressure check.** / Every 1 to 2 years.  Lipid and cholesterol check.** / Every 5 years beginning at age 20 years.  Lung cancer screening. / Every year if you are aged 55-80 years and have a 30-pack-year history of smoking and currently smoke or have  quit within the past 15 years. Yearly screening is stopped once you have quit smoking for at least 15 years or develop a health problem that would prevent you from having lung cancer treatment.  Clinical breast exam.** / Every year after age 40 years.  BRCA-related cancer risk assessment.** / For women who have family members with a BRCA-related cancer (breast, ovarian, tubal, or peritoneal cancers).  Mammogram.** / Every year beginning at age 40 years and continuing for as long as you are in good health. Consult with your health care provider.  Pap test.** / Every 3 years starting at age 30 years through age 65 or 70 years with 3 consecutive normal Pap tests. Testing can be stopped between 65 and 70 years with 3 consecutive normal Pap tests and no abnormal Pap or HPV tests in the past 10 years.  HPV screening.** / Every 3 years from ages 30 years through ages 65 or 70 years with a history of 3 consecutive normal Pap tests. Testing can be stopped between 65 and 70 years with 3 consecutive normal Pap tests and no abnormal Pap or HPV tests in the past 10 years.  Fecal occult blood test (FOBT) of stool. / Every year beginning at age 50 years and continuing until age 75 years. You may not need to do this test if you get a colonoscopy every 10 years.  Flexible sigmoidoscopy or colonoscopy.** / Every 5 years for a flexible sigmoidoscopy or every 10 years for a colonoscopy beginning at age 50 years and continuing until age 75 years.  Hepatitis C blood test.** / For all people born from 1945 through 1965 and any individual with known risks for hepatitis C.  Osteoporosis screening.** / A one-time screening for women ages 65 years and over and women at risk for fractures or osteoporosis.  Skin self-exam. / Monthly.  Influenza vaccine. / Every year.  Tetanus, diphtheria, and acellular pertussis (Tdap/Td) vaccine.** / 1 dose of Td every 10 years.  Varicella vaccine.** / Consult your health care  provider.  Zoster vaccine.** / 1 dose for adults aged 60 years or older.  Pneumococcal 13-valent conjugate (PCV13) vaccine.** / Consult your health care provider.  Pneumococcal polysaccharide (PPSV23) vaccine.** / 1 dose for all adults aged 65 years and older.  Meningococcal vaccine.** / Consult your health care provider.  Hepatitis A vaccine.** / Consult your health care provider.  Hepatitis B vaccine.** / Consult your health care provider.  Haemophilus influenzae type b (Hib) vaccine.** / Consult your health care provider. ** Family history and personal history of risk and conditions may change your health care provider's recommendations. Document Released: 06/01/2001 Document Revised: 08/20/2013 Document Reviewed: 08/31/2010 ExitCare Patient Information 2015 [employer]. This information is not intended to replace advice given to you by your health care provider. Make sure you discuss any questions   you have with your health care provider.

## 2014-08-23 NOTE — Progress Notes (Signed)
Subjective:   Madison [name] is a 69 y.o. female who presents for an Initial Medicare Annual Wellness Visit. Madison [name] is married and lives at home with her husband Joneen Boers.She has two children. She is a retired Copywriter, advertising.  She reports she is feeling well today. Her initial BP was 182/93 and she states that she has "white coat syndrome" but she also states that she stopped taking her HCTZ 12.5 mg  a few months ago because her BP had been running in the 469'G systolic. She states she has been monitoring it at home and it has been running 295-284 systolic. Repeat BP at the end of visit was still elevated at 184/80.  Review of Syst  Cardiac Risk Factors include: advanced age (>61men, >68 women);dyslipidemia;hypertension;smoking/ tobacco exposure     Objective:    Today's Vitals   08/22/14 1412 08/22/14 1515  BP: 182/93 184/80  Pulse: 74   Resp: 20   Height: 5' 4.5" (1.638 m)   Weight: 174 lb 3.2 oz (79.017 kg)   PainSc: 0-No pain     Current Medications (verified) Outpatient Encounter Prescriptions as of 08/22/2014  Medication Sig  . aspirin 325 MG tablet Take 325 mg by mouth daily.    . Diphenhydramine-APAP, sleep, (TYLENOL PM EXTRA STRENGTH PO) Take by mouth as needed.    . ergocalciferol (VITAMIN D2) 50000 UNITS capsule Take 1 capsule (50,000 Units total) by mouth once a week.  . losartan (COZAAR) 100 MG tablet Take 1 tablet (100 mg total) by mouth daily.  . metoprolol (LOPRESSOR) 50 MG tablet Take 0.5 tablets (25 mg total) by mouth 2 (two) times daily. Take half tablet two times a day  . niacin (NIASPAN) 1000 MG CR tablet Take 1 tablet (1,000 mg total) by mouth at bedtime.  . nitroGLYCERIN (NITROSTAT) 0.4 MG SL tablet Place 1 tablet (0.4 mg total) under the tongue every 5 (five) minutes as needed for chest pain.  . rosuvastatin (CRESTOR) 40 MG tablet Take 1 tablet (40 mg total) by mouth daily. As directed  . SYNTHROID 137 MCG tablet Take 1 tablet (137 mcg total) by mouth daily  before breakfast. TAKE 1 TABLET DAILY  . hydrochlorothiazide (HYDRODIURIL) 12.5 MG tablet Take 1 tablet (12.5 mg total) by mouth daily. (Patient not taking: Reported on 08/22/2014)   No facility-administered encounter medications on file as of 08/22/2014.    Allergies (verified) Banana; Bupropion hcl; Chantix; Latex; and Zetia   History: Past Medical History  Diagnosis Date  . Hypertension   . Hyperlipidemia   . Osteoporosis   . Thyroid cancer 2004    Dr. Clovia Cuff   . Diverticulosis   . IBS (irritable bowel syndrome)   . Tobacco abuse   . Need for prophylactic hormone replacement therapy (postmenopausal)   . CAD (coronary artery disease)     PCI of an occluded right coronary artery 2001. 70% LAD stenosis, 30% circumflex stenosis.  . Thyroid nodule 2004  . Osteopenia   . Leukocytosis, unspecified 07/24/2012  . Myocardial infarction 2001   Past Surgical History  Procedure Laterality Date  . Back surgery  06/2000  . Cholecystectomy  07/2002  . Thyroidectomy Bilateral 02/2003  . Lipoma excision Right 08/2003    r arm   . Appendectomy  1955   Family History  Problem Relation Age of Onset  . Cancer      family history   . Coronary artery disease Father   . Hypertension Maternal Grandmother   . Cirrhosis Mother  Social History   Occupational History  . Retired      Engineering geologist   Social History Main Topics  . Smoking status: Current Every Day Smoker -- 1.00 packs/day for 40 years    Types: Cigarettes  . Smokeless tobacco: Never Used  . Alcohol Use: Not on file  . Drug Use: No  . Sexual Activity: Not Currently    Tobacco Counseling Patient is not ready to quit. We did discuss the need to quit but she has tried in the past. She has tried medications and recently tried the vapor cigarettes. She know that her and her husband both need to quit but at this time neither are ready.    Activities of Daily Living In your present state of health, do you have any difficulty  performing the following activities: 08/22/2014  Hearing? N  Vision? N  Difficulty concentrating or making decisions? N  Walking or climbing stairs? N  Dressing or bathing? N  Doing errands, shopping? N  Preparing Food and eating ? N  Using the Toilet? N  In the past six months, have you accidently leaked urine? N  Do you have problems with loss of bowel control? N  Managing your Medications? N  Managing your Finances? N  Housekeeping or managing your Housekeeping? N    Immunizations and Health Maintenance Immunization History  Administered Date(s) Administered  . Influenza,inj,Quad PF,36+ Mos 01/25/2013, 01/31/2014  . Pneumococcal Conjugate-13 05/15/2013  . Pneumococcal Polysaccharide-23 02/02/2012  . Tdap 05/26/2007  . Zoster 11/26/2009   There are no preventive care reminders to display for this patient.  Patient Care Team: Chipper Herb, MD as PCP - General (Family Medicine) Lonna Duval, MD (Internal Medicine) Everardo All, MD (Hematology and Oncology) Minus Breeding, MD (Cardiology) Lafayette Dragon, MD as Consulting Physician (Gastroenterology)    Assessment:   This is a routine wellness examination for Madison [name].  Hearing/Vision screen No hearing deficits noted. She sees Dr. Linward Natal in [facility] with a recent visit 07/10/14 I will call for a copy of her recent visit for her chart.  Dietary issues and exercise activities discussed: Current Exercise Habits:: Home exercise routine, Type of exercise: walking, Time (Minutes): 30, Frequency (Times/Week): 2, Weekly Exercise (Minutes/Week): 60, Intensity: Moderate  Goals    None     Depression Screen PHQ 2/9 Scores 08/22/2014 05/29/2014 01/17/2014 01/04/2013  PHQ - 2 Score 0 0 0 0    Fall Risk Fall Risk  08/22/2014 05/29/2014 01/17/2014 01/04/2013  Falls in the past year? No No No No    Cognitive Function: MMSE - Mini Mental State Exam 08/22/2014  Orientation to time 5  Orientation to Place 5  Registration 3  Attention/  Calculation 5  Recall 1  Language- name 2 objects 2  Language- repeat 1  Language- follow 3 step command 3  Language- read & follow direction 1  Write a sentence 1  Copy design 1  Total score 28    Screening Tests Health Maintenance  Topic Date Due  . COLON CANCER SCREENING ANNUAL FOBT  12/28/2014 (Originally 05/23/2013)  . INFLUENZA VACCINE  11/18/2014  . MAMMOGRAM  01/09/2016  . DEXA SCAN  08/21/2016  . TETANUS/TDAP  05/25/2017  . COLONOSCOPY  12/18/2017  . ZOSTAVAX  Completed  . PNA vac Low Risk Adult  Completed      Plan:  She will monitor her BP and record. She states if it continues to stay elevated she will restart her HCTZ. We discussed the repercussions  if it were to stay elevated and she voices an understanding. She will call me or Dr. Tawanna Sat nurse if it continues to stay elevated. Continue all current medications as ordered Continue therapeutic lifestyle changes which include good diet and exercise Continue to walk 2-3 days a week  During the course of the visit, Deya was educated and counseled about the following appropriate screening and preventive services:   Vaccines to include Pneumoccal, Influenza, Tdap, Zostavax are all up to date  Electrocardiogram last 08/29/13 by Dr Percival Spanish   Cardiovascular disease screening pt is followed by Dr Percival Spanish cardiologist   Colorectal cancer screening  Last colonoscopy 12/19/2007 by Dr. Delfin Edis  Bone density screening completed today in the office   Diabetes screening last BS 06/27/14 was 99  Glaucoma screening last eye exam 07/10/2014 per Dr Linward Natal in [place] last 01/08/2014  PAP last normal 07/03/14   Nutrition counseling dash diet reccommended   Smoking cessation counseling we discussed cessation but patient is not ready at this time.  Patient Instructions (the written plan) were given to the patient.    Joneen Boers, RN   08/23/2014      I have reviewed and agree with the above  AWV documentation.  Claretta Fraise, M.D.

## 2014-09-25 ENCOUNTER — Encounter: Payer: Self-pay | Admitting: Cardiology

## 2014-09-25 ENCOUNTER — Ambulatory Visit (INDEPENDENT_AMBULATORY_CARE_PROVIDER_SITE_OTHER): Payer: Medicare Other | Admitting: Cardiology

## 2014-09-25 VITALS — BP 139/86 | HR 71 | Ht 65.0 in | Wt 175.0 lb

## 2014-09-25 DIAGNOSIS — I2510 Atherosclerotic heart disease of native coronary artery without angina pectoris: Secondary | ICD-10-CM | POA: Diagnosis not present

## 2014-09-25 DIAGNOSIS — I10 Essential (primary) hypertension: Secondary | ICD-10-CM

## 2014-09-25 MED ORDER — ASPIRIN 81 MG PO TABS: 81.0000 mg | ORAL_TABLET | Freq: Every day | ORAL | Status: DC

## 2014-09-25 NOTE — Patient Instructions (Signed)
Medication Instructions:  Please decrease your ASA to 81 mg a day.  Continue all other medications as listed.  Follow-Up: Follow up in 1 year with Dr. Percival Spanish.  You will receive a letter in the mail 2 months before you are due.  Please call us when you receive this letter to schedule your follow up appointment.  Thank you for choosing [facility]!!

## 2014-09-25 NOTE — Progress Notes (Addendum)
HPI The patient returns for followup of CAD.  She had a stress test in January of 2014 with no evidence if ischemia.  Her EF was OK.  Since I last saw her she has done OK.   The patient denies any new symptoms such as chest discomfort, neck or arm discomfort. There has been no new shortness of breath, PND or orthopnea. There have been no reported palpitations, presyncope or syncope.   She pushes a mower.  With her activities she does not have symptoms.  She continues to smoke.    Allergies  Allergen Reactions  . Banana     Throat and ears itch,ears feel like they swell  . Bupropion Hcl     REACTION: paranoia  . Chantix [Varenicline]     paranoia  . Latex Rash  . Zetia [Ezetimibe]     Myalgia    Current Outpatient Prescriptions  Medication Sig Dispense Refill  . aspirin 325 MG tablet Take 325 mg by mouth daily.      . Diphenhydramine-APAP, sleep, (TYLENOL PM EXTRA STRENGTH PO) Take by mouth as needed.      . ergocalciferol (VITAMIN D2) 50000 UNITS capsule Take 1 capsule (50,000 Units total) by mouth once a week. 4 capsule 3  . losartan (COZAAR) 100 MG tablet Take 1 tablet (100 mg total) by mouth daily. 30 tablet 6  . metoprolol (LOPRESSOR) 50 MG tablet Take 0.5 tablets (25 mg total) by mouth 2 (two) times daily. Take half tablet two times a day 30 tablet 6  . niacin (NIASPAN) 1000 MG CR tablet Take 1 tablet (1,000 mg total) by mouth at bedtime. 30 tablet 6  . nitroGLYCERIN (NITROSTAT) 0.4 MG SL tablet Place 1 tablet (0.4 mg total) under the tongue every 5 (five) minutes as needed for chest pain. 25 tablet 3  . rosuvastatin (CRESTOR) 40 MG tablet Take 1 tablet (40 mg total) by mouth daily. As directed 30 tablet 6  . SYNTHROID 137 MCG tablet Take 1 tablet (137 mcg total) by mouth daily before breakfast. TAKE 1 TABLET DAILY 34 tablet 6   No current facility-administered medications for this visit.    Past Medical History  Diagnosis Date  . Hypertension   . Hyperlipidemia   .  Osteoporosis   . Thyroid cancer 2004    Dr. Clovia Cuff   . Diverticulosis   . IBS (irritable bowel syndrome)   . Tobacco abuse   . Need for prophylactic hormone replacement therapy (postmenopausal)   . CAD (coronary artery disease)     PCI of an occluded right coronary artery 2001. 70% LAD stenosis, 30% circumflex stenosis.  . Thyroid nodule 2004  . Osteopenia   . Leukocytosis, unspecified 07/24/2012  . Myocardial infarction 2001    Past Surgical History  Procedure Laterality Date  . Back surgery  06/2000  . Cholecystectomy  07/2002  . Thyroidectomy Bilateral 02/2003  . Lipoma excision Right 08/2003    r arm   . Appendectomy  1955    ROS:  As stated in the HPI and negative for all other systems.  PHYSICAL EXAM BP 139/86 mmHg  Pulse 71  Ht 5\' 5"  (1.651 m)  Wt 175 lb (79.379 kg)  BMI 29.12 kg/m2 GENERAL:  Well appearing NECK:  No jugular venous distention, waveform within normal limits, carotid upstroke brisk and symmetric, no bruits, no thyromegaly LUNGS:  Clear to auscultation bilaterally BACK:  No CVA tenderness CHEST:  Unremarkable HEART:  PMI not displaced or sustained,S1 and S2 within  normal limits, no S3, no S4, no clicks, no rubs, no murmurs ABD:  Flat, positive bowel sounds normal in frequency in pitch, no bruits, no rebound, no guarding, no midline pulsatile mass, no hepatomegaly, no splenomegaly EXT:  2 plus pulses throughout, no edema, no cyanosis no clubbing   EKG:  Sinus rhythm, rate 71, axis within normal limits, intervals within normal limits, no acute ST-T wave changes.  09/25/2014   ASSESSMENT AND PLAN  CORONARY ARTERY DISEASE -  The patient has no new sypmtoms.  No further cardiovascular testing is indicated.  We will continue with aggressive risk reduction and meds as listed.  TOBACCO ABUSE -  We again discussed this.   She did not tolerate the patches and Zyban and Chantix did not help.    DYSLIPIDEMIA -  This is followed by Dr. Laurance Flatten.  Of note, she  reports that her triglycerides go way up if she is not on niacin.  I will defer to Dr. Laurance Flatten.  She will continue on both statin and niacin.   HTN - The blood pressure is at target. No change in medications is indicated. We will continue with therapeutic lifestyle changes (TLC).

## 2014-10-28 ENCOUNTER — Ambulatory Visit (INDEPENDENT_AMBULATORY_CARE_PROVIDER_SITE_OTHER): Payer: Medicare Other | Admitting: Family Medicine

## 2014-10-28 ENCOUNTER — Encounter: Payer: Self-pay | Admitting: Family Medicine

## 2014-10-28 VITALS — BP 142/78 | HR 7 | Temp 97.8°F | Ht 65.0 in | Wt 172.0 lb

## 2014-10-28 DIAGNOSIS — I10 Essential (primary) hypertension: Secondary | ICD-10-CM | POA: Diagnosis not present

## 2014-10-28 DIAGNOSIS — E785 Hyperlipidemia, unspecified: Secondary | ICD-10-CM | POA: Diagnosis not present

## 2014-10-28 DIAGNOSIS — E8881 Metabolic syndrome: Secondary | ICD-10-CM | POA: Diagnosis not present

## 2014-10-28 DIAGNOSIS — M8580 Other specified disorders of bone density and structure, unspecified site: Secondary | ICD-10-CM | POA: Diagnosis not present

## 2014-10-28 DIAGNOSIS — K589 Irritable bowel syndrome without diarrhea: Secondary | ICD-10-CM

## 2014-10-28 DIAGNOSIS — Z780 Asymptomatic menopausal state: Secondary | ICD-10-CM | POA: Diagnosis not present

## 2014-10-28 LAB — POCT CBC
Granulocyte percent: 55.2 %G (ref 37–80)
HCT, POC: 50.4 % — AB (ref 37.7–47.9)
Hemoglobin: 16.5 g/dL — AB (ref 12.2–16.2)
Lymph, poc: 4.1 — AB (ref 0.6–3.4)
MCH, POC: 30.5 pg (ref 27–31.2)
MCHC: 32.7 g/dL (ref 31.8–35.4)
MCV: 93.2 fL (ref 80–97)
MPV: 9 fL (ref 0–99.8)
POC Granulocyte: 5.9 (ref 2–6.9)
POC LYMPH PERCENT: 38.6 %L (ref 10–50)
Platelet Count, POC: 182 10*3/uL (ref 142–424)
RBC: 5.41 M/uL (ref 4.04–5.48)
RDW, POC: 12.6 %
WBC: 10.7 10*3/uL — AB (ref 4.6–10.2)

## 2014-10-28 NOTE — Addendum Note (Signed)
Addended by: Ilean China on: 10/28/2014 11:37 AM   Modules accepted: Orders

## 2014-10-28 NOTE — Progress Notes (Signed)
Subjective:    Patient ID: Madison [name], female    DOB: Apr 28, 1945, 69 y.o.   MRN: [id number]  HPI 69 year old female comes in today for a follow up on her chronic medical conditions which include hypertension, hyperlipidemia, metabolic syndrome, hypothyroidism and ostepoenia. The patient today complains of nasal congestion and drainage. She also describes some occasional bright red blood per rectum which she thinks is coming from her hemorrhoids. Her last colonoscopy was in September 2009. She also complains of just some heat intolerance and headache. The patient denies chest pain or shortness of breath. She is recently seen the cardiologist and got a good report from him and still will see him once yearly. She sees the endocrinologist Dr. Tod Persia at [facility] and sees him yearly in January. He follows her because of the history of thyroid cancer. She denies GI symptoms other than problems with blood in the stool from hemorrhoids which she says is bright red in color. This is especially bad after having frequent bowel movements and is noted on the toilet tissue. She has no trouble passing her water.   Patient Active Problem List   Diagnosis Date Noted  . Metabolic syndrome 17/49/4496  . Leukocytosis 01/04/2013  . TOBACCO ABUSE 10/29/2009  . THYROID CANCER 11/30/2007  . Hyperlipidemia 11/30/2007  . HTN (hypertension) 11/30/2007  . Coronary atherosclerosis 11/30/2007  . IRRITABLE BOWEL SYNDROME 11/30/2007  . Osteopenia 11/30/2007   Outpatient Encounter Prescriptions as of 10/28/2014  Medication Sig  . aspirin 81 MG tablet Take 1 tablet (81 mg total) by mouth daily.  . Diphenhydramine-APAP, sleep, (TYLENOL PM EXTRA STRENGTH PO) Take by mouth as needed.    . ergocalciferol (VITAMIN D2) 50000 UNITS capsule Take 1 capsule (50,000 Units total) by mouth once a week.  . losartan (COZAAR) 100 MG tablet Take 1 tablet (100 mg total) by mouth daily.  . metoprolol (LOPRESSOR) 50  MG tablet Take 0.5 tablets (25 mg total) by mouth 2 (two) times daily. Take half tablet two times a day  . niacin (NIASPAN) 1000 MG CR tablet Take 1 tablet (1,000 mg total) by mouth at bedtime.  . nitroGLYCERIN (NITROSTAT) 0.4 MG SL tablet Place 1 tablet (0.4 mg total) under the tongue every 5 (five) minutes as needed for chest pain.  . rosuvastatin (CRESTOR) 40 MG tablet Take 1 tablet (40 mg total) by mouth daily. As directed  . SYNTHROID 137 MCG tablet Take 1 tablet (137 mcg total) by mouth daily before breakfast. TAKE 1 TABLET DAILY   No facility-administered encounter medications on file as of 10/28/2014.     Review of Systems  Constitutional: Negative.   HENT: Positive for rhinorrhea.   Eyes: Negative.   Respiratory: Negative.   Cardiovascular: Negative.   Gastrointestinal: Positive for blood in stool (occasional bright red blood due to hemorrhoids).       Complications of IBS  Endocrine: Positive for heat intolerance.  Genitourinary: Negative.   Musculoskeletal: Negative.   Skin: Negative.   Neurological: Positive for headaches. Negative for dizziness.  Hematological: Negative.   Psychiatric/Behavioral: Negative.        Objective:   Physical Exam  Constitutional: She is oriented to person, place, and time. She appears well-developed and well-nourished. No distress.  HENT:  Head: Normocephalic and atraumatic.  Right Ear: External ear normal.  Left Ear: External ear normal.  Mouth/Throat: Oropharynx is clear and moist.  Some nasal congestion bilaterally left greater than right. Ear canals are clear.  Eyes: Conjunctivae and EOM are normal. Pupils are equal, round, and reactive to light. Right eye exhibits no discharge. Left eye exhibits no discharge. No scleral icterus.  Neck: Normal range of motion. Neck supple. No thyromegaly present.  No carotid bruits anterior cervical adenopathy or thyromegaly  Cardiovascular: Normal rate, regular rhythm, normal heart sounds and intact  distal pulses.   No murmur heard. At 72/m  Pulmonary/Chest: Effort normal and breath sounds normal. No respiratory distress. She has no wheezes. She has no rales. She exhibits no tenderness.  Clear anteriorly and posteriorly  Abdominal: Soft. Bowel sounds are normal. She exhibits no mass. There is no tenderness. There is no rebound and no guarding.  Musculoskeletal: Normal range of motion. She exhibits no edema or tenderness.  Lymphadenopathy:    She has no cervical adenopathy.  Neurological: She is alert and oriented to person, place, and time. She has normal reflexes. No cranial nerve deficit.  Skin: Skin is warm and dry. No rash noted.  Psychiatric: She has a normal mood and affect. Her behavior is normal. Judgment and thought content normal.  Nursing note and vitals reviewed.   BP 142/78 mmHg  Pulse 7  Temp(Src) 97.8 F (36.6 C) (Oral)  Ht 5' 5" (1.651 m)  Wt 172 lb (78.019 kg)  BMI 28.62 kg/m2       Assessment & Plan:  1. Osteopenia -Patient should continue with her calcium and vitamin D replacement pending results of lab work being done today. - Vit D  25 hydroxy (rtn osteoporosis monitoring) - DG Bone Density; Future  2. Hyperlipidemia -She should continue with her aggressive therapeutic lifestyle changes which include diet and exercise and her current treatment with Crestor. - NMR, lipoprofile - Hepatic function panel  3. Essential hypertension -The blood pressure is slightly elevated today and she will continue with her current treatment and watching her sodium intake more closely. - POCT CBC - NTI1+WERX  4. Metabolic syndrome -Continue with aggressive therapeutic lifestyle changes and weight loss as much as possible and drinking more water - BMP8+EGFR  5. Post-menopausal -She will be scheduled for a DEXA scan, making sure that it has been 2 years since the last DEXA. - DG Bone Density; Future  6. IBS (irritable bowel syndrome) -We will discuss treatment  for irritable bowel syndrome and diarrhea type with her gastroenterologist and we'll consider initiating this for a period of time to see if it will help control the loose bowel movements.  No orders of the defined types were placed in this encounter.   Patient Instructions     Continue current medications. Continue good therapeutic lifestyle changes which include good diet and exercise. Fall precautions discussed with patient. If an FOBT was given today- please return it to our front desk. If you are over 77 years old - you may need Prevnar 69 or the adult Pneumonia vaccine.  After your visit with [place] today you will receive a survey in the mail or online from [employer] regarding your care with [place]. Please take a moment to fill this out. Your feedback is very important to [place] as you can help [place] better understand your patient needs as well as improve your experience and satisfaction. WE CARE ABOUT YOU!!!                        Medicare Annual Wellness Visit  [facility] and the medical providers at [facility] strive to bring you the best  medical care.  In doing so we not only want to address your current medical conditions and concerns but also to detect new conditions early and prevent illness, disease and health-related problems.    Medicare offers a yearly Wellness Visit which allows our clinical staff to assess your need for preventative services including immunizations, lifestyle education, counseling to decrease risk of preventable diseases and screening for fall risk and other medical concerns.    This visit is provided free of charge (no copay) for all Medicare recipients. The clinical pharmacists at [facility] have begun to conduct these Wellness Visits which will also include a thorough review of all your medications.    As you primary medical provider recommend that you make an appointment for your Annual Wellness Visit if you have not  done so already this year.  You may set up this appointment before you leave today or you may call back ([phone]) and schedule an appointment.  Please make sure when you call that you mention that you are scheduling your Annual Wellness Visit with the clinical pharmacist so that the appointment may be made for the proper length of time.     the patient should continue to make all efforts at trying to stop smoking.  She should return the FOBT  We will call her with the lab work results once it becomes available  If she continues to have loose bowel movements from her irritable bowel syndrome , we will discuss with her gastroenterologist about a new medication that can be used for irritable bowel syndrome diarrhea type. She should think about this and call me if she would like for me to initiate this discussion with her gastroenterologist and possibly let her try this for 2 or 3 weeks.  She is possibly due to get her DEXA scan. We will check the records and confirm this and arrange for her to get this if it is deep.   Arrie Senate MD

## 2014-10-28 NOTE — Patient Instructions (Addendum)
    Continue current medications. Continue good therapeutic lifestyle changes which include good diet and exercise. Fall precautions discussed with patient. If an FOBT was given today- please return it to our front desk. If you are over 68 years old - you may need Prevnar 63 or the adult Pneumonia vaccine.  After your visit with [place] today you will receive a survey in the mail or online from [employer] regarding your care with [place]. Please take a moment to fill this out. Your feedback is very important to [place] as you can help [place] better understand your patient needs as well as improve your experience and satisfaction. WE CARE ABOUT YOU!!!                        Medicare Annual Wellness Visit  [facility] and the medical providers at [facility] strive to bring you the best medical care.  In doing so we not only want to address your current medical conditions and concerns but also to detect new conditions early and prevent illness, disease and health-related problems.    Medicare offers a yearly Wellness Visit which allows our clinical staff to assess your need for preventative services including immunizations, lifestyle education, counseling to decrease risk of preventable diseases and screening for fall risk and other medical concerns.    This visit is provided free of charge (no copay) for all Medicare recipients. The clinical pharmacists at [facility] have begun to conduct these Wellness Visits which will also include a thorough review of all your medications.    As you primary medical provider recommend that you make an appointment for your Annual Wellness Visit if you have not done so already this year.  You may set up this appointment before you leave today or you may call back ([phone]) and schedule an appointment.  Please make sure when you call that you mention that you are scheduling your Annual Wellness Visit with the clinical pharmacist so that  the appointment may be made for the proper length of time.     the patient should continue to make all efforts at trying to stop smoking.  She should return the FOBT  We will call her with the lab work results once it becomes available  If she continues to have loose bowel movements from her irritable bowel syndrome , we will discuss with her gastroenterologist about a new medication that can be used for irritable bowel syndrome diarrhea type. She should think about this and call me if she would like for me to initiate this discussion with her gastroenterologist and possibly let her try this for 2 or 3 weeks.  She is possibly due to get her DEXA scan. We will check the records and confirm this and arrange for her to get this if it is deep.

## 2014-10-29 LAB — VITAMIN D 25 HYDROXY (VIT D DEFICIENCY, FRACTURES): Vit D, 25-Hydroxy: 46.4 ng/mL (ref 30.0–100.0)

## 2014-10-29 LAB — BMP8+EGFR
BUN/Creatinine Ratio: 19 (ref 11–26)
BUN: 15 mg/dL (ref 8–27)
CO2: 19 mmol/L (ref 18–29)
Calcium: 9.4 mg/dL (ref 8.7–10.3)
Chloride: 100 mmol/L (ref 97–108)
Creatinine, Ser: 0.8 mg/dL (ref 0.57–1.00)
GFR calc Af Amer: 88 mL/min/{1.73_m2} (ref >59)
GFR calc non Af Amer: 76 mL/min/{1.73_m2} (ref >59)
Glucose: 103 mg/dL — ABNORMAL HIGH (ref 65–99)
Potassium: 4.9 mmol/L (ref 3.5–5.2)
Sodium: 139 mmol/L (ref 134–144)

## 2014-10-29 LAB — HEPATIC FUNCTION PANEL
ALT: 19 IU/L (ref 0–32)
AST: 17 IU/L (ref 0–40)
Albumin: 4.6 g/dL (ref 3.6–4.8)
Alkaline Phosphatase: 75 IU/L (ref 39–117)
Bilirubin Total: <0.2 mg/dL (ref 0.0–1.2)
Bilirubin, Direct: 0.06 mg/dL (ref 0.00–0.40)
Total Protein: 7.3 g/dL (ref 6.0–8.5)

## 2014-10-29 LAB — NMR, LIPOPROFILE
Cholesterol: 138 mg/dL (ref 100–199)
HDL Cholesterol by NMR: 34 mg/dL — ABNORMAL LOW (ref >39)
HDL Particle Number: 26.2 umol/L — ABNORMAL LOW (ref >=30.5)
LDL Particle Number: 1093 nmol/L — ABNORMAL HIGH (ref <1000)
LDL Size: 20.6 nm (ref >20.5)
LDL-C: 72 mg/dL (ref 0–99)
LP-IR Score: 74 — ABNORMAL HIGH (ref <=45)
Small LDL Particle Number: 648 nmol/L — ABNORMAL HIGH (ref <=527)
Triglycerides by NMR: 159 mg/dL — ABNORMAL HIGH (ref 0–149)

## 2014-10-30 ENCOUNTER — Telehealth: Payer: Self-pay | Admitting: *Deleted

## 2014-10-30 ENCOUNTER — Encounter: Payer: Self-pay | Admitting: *Deleted

## 2014-10-30 NOTE — Telephone Encounter (Signed)
Pt notified of lab results from visit on 10/28/14.

## 2014-12-12 ENCOUNTER — Other Ambulatory Visit: Payer: Self-pay | Admitting: Family Medicine

## 2014-12-17 ENCOUNTER — Encounter: Payer: Self-pay | Admitting: Internal Medicine

## 2014-12-30 ENCOUNTER — Other Ambulatory Visit: Payer: Self-pay

## 2014-12-30 DIAGNOSIS — Z1231 Encounter for screening mammogram for malignant neoplasm of breast: Secondary | ICD-10-CM

## 2014-12-31 ENCOUNTER — Encounter: Payer: Self-pay | Admitting: Nurse Practitioner

## 2014-12-31 ENCOUNTER — Ambulatory Visit (INDEPENDENT_AMBULATORY_CARE_PROVIDER_SITE_OTHER): Payer: Medicare Other | Admitting: Nurse Practitioner

## 2014-12-31 VITALS — BP 150/87 | HR 86 | Temp 98.0°F | Ht 65.0 in | Wt 175.0 lb

## 2014-12-31 DIAGNOSIS — J441 Chronic obstructive pulmonary disease with (acute) exacerbation: Secondary | ICD-10-CM | POA: Diagnosis not present

## 2014-12-31 MED ORDER — AZITHROMYCIN 250 MG PO TABS: ORAL_TABLET | ORAL | Status: DC

## 2014-12-31 MED ORDER — BENZONATATE 100 MG PO CAPS: 100.0000 mg | ORAL_CAPSULE | Freq: Three times a day (TID) | ORAL | Status: DC | PRN

## 2014-12-31 NOTE — Patient Instructions (Signed)

## 2014-12-31 NOTE — Progress Notes (Signed)
   Subjective:    Patient ID: Madison [name], female    DOB: May 03, 1945, 69 y.o.   MRN: [id number]  HPI    Review of Systems     Objective:   Physical Exam        Assessment & Plan:   Subjective:     Madison [name] is a 69 y.o. female who presents for evaluation of sinus pain. Symptoms include: clear rhinorrhea, congestion, cough, headaches, nasal congestion, post nasal drip and sore throat. Onset of symptoms was 3 days ago. Symptoms have been gradually improving since that time. Past history is significant for occasional episodes of bronchitis. Patient is a smoker  (1 ppd x 50 yrs).  The following portions of the patient's history were reviewed and updated as appropriate: allergies, current medications, past family history, past medical history, past social history, past surgical history and problem list.  Review of Systems Pertinent items are noted in HPI.   Objective:    BP 150/87 mmHg  Pulse 86  Temp(Src) 98 F (36.7 C) (Oral)  Ht 5\' 5"  (1.651 m)  Wt 175 lb (79.379 kg)  BMI 29.12 kg/m2 General appearance: alert and cooperative Eyes: conjunctivae/corneas clear. PERRL, EOM's intact. Fundi benign. Ears: normal TM's and external ear canals both ears Nose: clear discharge, moderate congestion, turbinates pale Throat: lips, mucosa, and tongue normal; teeth and gums normal Neck: no adenopathy, no carotid bruit, no JVD, supple, symmetrical, trachea midline and thyroid not enlarged, symmetric, no tenderness/mass/nodules Lungs: deep wet cough- no wheezes rales or rhonchi Heart: regular rate and rhythm, S1, S2 normal, no murmur, click, rub or gallop    Assessment:     COPD exacerbation .    Plan:  1. Take meds as prescribed 2. Use a cool mist humidifier especially during the winter months and when heat has been humid. 3. Use saline nose sprays frequently 4. Saline irrigations of the nose can be very helpful if done frequently.  * 4X daily for 1 week*  * Use of a  nettie pot can be helpful with this. Follow directions with this* 5. Drink plenty of fluids 6. Keep thermostat turn down low 7.For any cough or congestion  Use plain Mucinex- regular strength or max strength is fine   * Children- consult with Pharmacist for dosing 8. For fever or aces or pains- take tylenol or ibuprofen appropriate for age and weight.  * for fevers greater than 101 orally you may alternate ibuprofen and tylenol every  3 hours.    Meds ordered this encounter  Medications  . azithromycin (ZITHROMAX Z-PAK) 250 MG tablet    Sig: As directed    Dispense:  1 each    Refill:  0    Order Specific Question:  Supervising Provider    Answer:  Chipper Herb [[id number]]  . benzonatate (TESSALON PERLES) 100 MG capsule    Sig: Take 1 capsule (100 mg total) by mouth 3 (three) times daily as needed for cough.    Dispense:  20 capsule    Refill:  0    Order Specific Question:  Supervising Provider    Answer:  Chipper Herb [place], FNP

## 2015-01-07 DIAGNOSIS — H25813 Combined forms of age-related cataract, bilateral: Secondary | ICD-10-CM | POA: Diagnosis not present

## 2015-01-07 DIAGNOSIS — H35373 Puckering of macula, bilateral: Secondary | ICD-10-CM | POA: Diagnosis not present

## 2015-01-13 ENCOUNTER — Ambulatory Visit
Admission: RE | Admit: 2015-01-13 | Discharge: 2015-01-13 | Disposition: A | Discharge status: Home / Self Care | Payer: Medicare Other | Source: Ambulatory Visit

## 2015-01-13 DIAGNOSIS — Z1231 Encounter for screening mammogram for malignant neoplasm of breast: Secondary | ICD-10-CM | POA: Diagnosis not present

## 2015-02-04 ENCOUNTER — Encounter: Payer: Self-pay | Admitting: Family Medicine

## 2015-02-18 ENCOUNTER — Other Ambulatory Visit: Payer: Self-pay | Admitting: Family Medicine

## 2015-02-28 ENCOUNTER — Ambulatory Visit (INDEPENDENT_AMBULATORY_CARE_PROVIDER_SITE_OTHER): Payer: Medicare Other

## 2015-02-28 ENCOUNTER — Encounter: Payer: Self-pay | Admitting: Family Medicine

## 2015-02-28 ENCOUNTER — Ambulatory Visit (INDEPENDENT_AMBULATORY_CARE_PROVIDER_SITE_OTHER): Payer: Medicare Other | Admitting: Family Medicine

## 2015-02-28 ENCOUNTER — Encounter: Payer: Self-pay | Admitting: *Deleted

## 2015-02-28 ENCOUNTER — Other Ambulatory Visit: Payer: Self-pay | Admitting: Pharmacist

## 2015-02-28 VITALS — BP 137/80 | HR 69 | Temp 97.7°F | Ht 65.0 in | Wt 172.0 lb

## 2015-02-28 DIAGNOSIS — I10 Essential (primary) hypertension: Secondary | ICD-10-CM

## 2015-02-28 DIAGNOSIS — Z720 Tobacco use: Secondary | ICD-10-CM

## 2015-02-28 DIAGNOSIS — F17200 Nicotine dependence, unspecified, uncomplicated: Secondary | ICD-10-CM

## 2015-02-28 DIAGNOSIS — R05 Cough: Secondary | ICD-10-CM | POA: Diagnosis not present

## 2015-02-28 DIAGNOSIS — E559 Vitamin D deficiency, unspecified: Secondary | ICD-10-CM

## 2015-02-28 DIAGNOSIS — E785 Hyperlipidemia, unspecified: Secondary | ICD-10-CM

## 2015-02-28 DIAGNOSIS — R059 Cough, unspecified: Secondary | ICD-10-CM

## 2015-02-28 DIAGNOSIS — J40 Bronchitis, not specified as acute or chronic: Secondary | ICD-10-CM

## 2015-02-28 DIAGNOSIS — J209 Acute bronchitis, unspecified: Secondary | ICD-10-CM

## 2015-02-28 DIAGNOSIS — Z7689 Persons encountering health services in other specified circumstances: Secondary | ICD-10-CM | POA: Diagnosis not present

## 2015-02-28 DIAGNOSIS — E8881 Metabolic syndrome: Secondary | ICD-10-CM | POA: Diagnosis not present

## 2015-02-28 MED ORDER — PREDNISONE 10 MG PO TABS: ORAL_TABLET | ORAL | Status: DC

## 2015-02-28 MED ORDER — METHYLPREDNISOLONE ACETATE 80 MG/ML IJ SUSP
60.0000 mg | Freq: Once | INTRAMUSCULAR | Status: AC
  Administered 2015-02-28: 60 mg via INTRAMUSCULAR

## 2015-02-28 MED ORDER — FENOFIBRATE 160 MG PO TABS: 160.0000 mg | ORAL_TABLET | Freq: Every day | ORAL | Status: DC

## 2015-02-28 NOTE — Progress Notes (Signed)
Madison [name],  Per [facility]   Pt's drug NIACIN ER 1000mg   Is changing to a $95 co-pay in January.  The note recommends  NIACOR Fenofibrate 160 mg  She is already on CRESTOR 40  What would you recommend?

## 2015-02-28 NOTE — Progress Notes (Signed)
Pt aware.

## 2015-02-28 NOTE — Progress Notes (Signed)
Patient ID: MARJA MCCLELAND, female   DOB: [birth year], 69 y.o.   MRN: [id number] Dewaine Oats is an immedicate release niacin product. My concern with switching to that product is that IR niacin is more likely to cause adversed effect on her liver.   I would recommend that she changed to fenofibrate 160mg  take 1 tablet daily.  Recheck lipids in 3 months.   Monitor for myalgias.  Call office if occurs and would CPK check.  Rx sent to pharmacy.

## 2015-02-28 NOTE — Progress Notes (Signed)
Subjective:    Patient ID: Madison [name], female    DOB: 05-06-1945, 69 y.o.   MRN: [id number]  HPI Pt here for follow up and management of chronic medical problems which includes hypertension and hyperlipidemia. She is taking medications regularly. The patient has few complaints other than a cough. She is still smoking. She is due to have an FOBT return and get lab work today. She will also get her flu shot today. The repeat blood pressure today was better than previously. The patient continues to have cough congestion and wheezing. She took a Z-Pak initially but continues to have cough and congestion. She has not been running any fever. She does continue to smoke but has reduced this somewhat with a cough and congestion. I do not believe she is willing to stop smoking. I have encouraged her to do this on many occasions and she continues to smoke. Patient denies chest pain or shortness of breath. She swallowing without any problems and not having any indigestion nausea vomiting diarrhea or blood in the stool. She is followed regularly by a physician in [facility] for her thyroid cancer. She is not having any trouble passing her water. Because she is a smoker we will repeat her chest x-ray today because of the persistent cough and congestion.      Patient Active Problem List   Diagnosis Date Noted  . Metabolic syndrome 81/04/7508  . Leukocytosis 01/04/2013  . TOBACCO ABUSE 10/29/2009  . THYROID CANCER 11/30/2007  . Hyperlipidemia 11/30/2007  . HTN (hypertension) 11/30/2007  . Coronary atherosclerosis 11/30/2007  . IRRITABLE BOWEL SYNDROME 11/30/2007  . Osteopenia 11/30/2007   Outpatient Encounter Prescriptions as of 02/28/2015  Medication Sig  . aspirin 81 MG tablet Take 1 tablet (81 mg total) by mouth daily.  . Diphenhydramine-APAP, sleep, (TYLENOL PM EXTRA STRENGTH PO) Take by mouth as needed.    . ergocalciferol (VITAMIN D2) 50000 UNITS capsule Take 1 capsule (50,000 Units total)  by mouth once a week.  . losartan (COZAAR) 100 MG tablet Take 1 tablet (100 mg total) by mouth daily.  . metoprolol (LOPRESSOR) 50 MG tablet TAKE 1/2 TABLET TWICE DAILY  . niacin (NIASPAN) 1000 MG CR tablet TAKE ONE TABLET DAILY AT BEDTIME  . nitroGLYCERIN (NITROSTAT) 0.4 MG SL tablet Place 1 tablet (0.4 mg total) under the tongue every 5 (five) minutes as needed for chest pain.  . rosuvastatin (CRESTOR) 40 MG tablet Take 1 tablet (40 mg total) by mouth daily. As directed  . SYNTHROID 137 MCG tablet Take 1 tablet (137 mcg total) by mouth daily before breakfast. TAKE 1 TABLET DAILY  . [DISCONTINUED] azithromycin (ZITHROMAX Z-PAK) 250 MG tablet As directed  . [DISCONTINUED] benzonatate (TESSALON PERLES) 100 MG capsule Take 1 capsule (100 mg total) by mouth 3 (three) times daily as needed for cough.   No facility-administered encounter medications on file as of 02/28/2015.     Review of Systems  Constitutional: Negative.   HENT: Negative.   Eyes: Negative.   Respiratory: Positive for cough.   Cardiovascular: Negative.   Gastrointestinal: Negative.   Endocrine: Negative.   Genitourinary: Negative.   Musculoskeletal: Negative.   Skin: Negative.   Allergic/Immunologic: Negative.   Neurological: Negative.   Hematological: Negative.   Psychiatric/Behavioral: Negative.        Objective:   Physical Exam  Constitutional: She is oriented to person, place, and time. She appears well-developed and well-nourished. No distress.  HENT:  Head: Normocephalic and atraumatic.  Right Ear: External  ear normal.  Left Ear: External ear normal.  Mouth/Throat: Oropharynx is clear and moist.  Nasal turbinate congestion and redness bilaterally  Eyes: Conjunctivae and EOM are normal. Pupils are equal, round, and reactive to light. Right eye exhibits no discharge. Left eye exhibits no discharge. No scleral icterus.  Neck: Normal range of motion. Neck supple. No thyromegaly present.  No thyromegaly or  bruits.  Cardiovascular: Normal rate, regular rhythm and normal heart sounds.   No murmur heard. Pulmonary/Chest: Effort normal. No respiratory distress. She has wheezes. She has no rales. She exhibits no tenderness.  Rhonchi with coughing  Abdominal: Soft. Bowel sounds are normal. She exhibits no mass. There is no tenderness. There is no rebound and no guarding.  No spleen or liver enlargement and no abdominal bruits  Musculoskeletal: Normal range of motion. She exhibits no edema.  Lymphadenopathy:    She has no cervical adenopathy.  Neurological: She is alert and oriented to person, place, and time. She has normal reflexes. No cranial nerve deficit.  Skin: Skin is warm and dry. Rash noted.  Psychiatric: She has a normal mood and affect. Her behavior is normal. Judgment and thought content normal.  Nursing note and vitals reviewed.  BP 137/80 mmHg  Pulse 69  Temp(Src) 97.7 F (36.5 C) (Oral)  Ht _0  (1.651 m)  Wt 172 lb (78.019 kg)  BMI 28.62 kg/m2    [facility] reading (PRIMARY) by  Dr. Brunilda Payor x-ray-- prominent bronchial markings and degenerative changes in spine otherwise no active disease                                      Assessment & Plan:  1. Hyperlipidemia -Continue with Crestor and Niaspan if possible - CBC with Differential/Platelet - NMR, lipoprofile  2. Essential hypertension -The blood pressure is improved today and she should continue with current treatment - BMP8+EGFR - CBC with Differential/Platelet - Hepatic function panel - DG Chest 2 View; Future  3. Metabolic syndrome -Continue with aggressive therapeutic lifestyle changes which include diet and exercise - BMP8+EGFR - CBC with Differential/Platelet  4. Vitamin D deficiency -Continue with vitamin D replacement pending results of lab - Vit D  25 hydroxy (rtn osteoporosis monitoring)  5. Cough -Continue to drink plenty of fluids and use Mucinex regularly whether coughing or not. Also use nasal  saline frequently. - DG Chest 2 View; Future - predniSONE (DELTASONE) 10 MG tablet; 1 tablet 4 times a day for 2 days,  1 tablet 3 times a day for 2 days,  1 tablet 2 times a day for 2 days, 1 tablet daily for 2 days  Dispense: 20 tablet; Refill: 0 - methylPREDNISolone acetate (DEPO-MEDROL) injection 60 mg; Inject 0.75 mLs (60 mg total) into the muscle once.  6. Smoker -Make every effort to try to stop smoking - DG Chest 2 View; Future  7. Bronchitis with bronchospasm -No more antibiotic unless white blood cell count is elevated higher than its usual level. - predniSONE (DELTASONE) 10 MG tablet; 1 tablet 4 times a day for 2 days,  1 tablet 3 times a day for 2 days,  1 tablet 2 times a day for 2 days, 1 tablet daily for 2 days  Dispense: 20 tablet; Refill: 0 - methylPREDNISolone acetate (DEPO-MEDROL) injection 60 mg; Inject 0.75 mLs (60 mg total) into the muscle once.  Patient Instructions  Medicare Annual Wellness Visit  [facility] and the medical providers at [facility] strive to bring you the best medical care.  In doing so we not only want to address your current medical conditions and concerns but also to detect new conditions early and prevent illness, disease and health-related problems.    Medicare offers a yearly Wellness Visit which allows our clinical staff to assess your need for preventative services including immunizations, lifestyle education, counseling to decrease risk of preventable diseases and screening for fall risk and other medical concerns.    This visit is provided free of charge (no copay) for all Medicare recipients. The clinical pharmacists at [facility] have begun to conduct these Wellness Visits which will also include a thorough review of all your medications.    As you primary medical provider recommend that you make an appointment for your Annual Wellness Visit if you have not done so  already this year.  You may set up this appointment before you leave today or you may call back [phone]) and schedule an appointment.  Please make sure when you call that you mention that you are scheduling your Annual Wellness Visit with the clinical pharmacist so that the appointment may be made for the proper length of time.     Continue current medications. Continue good therapeutic lifestyle changes which include good diet and exercise. Fall precautions discussed with patient. If an FOBT was given today- please return it to our front desk. If you are over 56 years old - you may need Prevnar 44 or the adult Pneumonia vaccine.  **Flu shots are available--- please call and schedule a FLU-CLINIC appointment**  After your visit with [place] today you will receive a survey in the mail or online from [employer] regarding your care with [place]. Please take a moment to fill this out. Your feedback is very important to [place] as you can help [place] better understand your patient needs as well as improve your experience and satisfaction. WE CARE ABOUT YOU!!!   The patient she use nasal saline as directed 1 spray each nostril at least 3 or 4 times or more daily She should continue to take Mucinex one twice daily with a large glass of water twice daily whether she is coughing or not She should make every effort possible to reduce her cigarette smoking She should take the prednisone as directed She should wait a couple weeks to come back and get her flu shot She should continue to drink plenty of fluids and stay well hydrated   Arrie Senate MD

## 2015-02-28 NOTE — Patient Instructions (Addendum)
Medicare Annual Wellness Visit  [facility] and the medical providers at [facility] strive to bring you the best medical care.  In doing so we not only want to address your current medical conditions and concerns but also to detect new conditions early and prevent illness, disease and health-related problems.    Medicare offers a yearly Wellness Visit which allows our clinical staff to assess your need for preventative services including immunizations, lifestyle education, counseling to decrease risk of preventable diseases and screening for fall risk and other medical concerns.    This visit is provided free of charge (no copay) for all Medicare recipients. The clinical pharmacists at [facility] have begun to conduct these Wellness Visits which will also include a thorough review of all your medications.    As you primary medical provider recommend that you make an appointment for your Annual Wellness Visit if you have not done so already this year.  You may set up this appointment before you leave today or you may call back [phone] and schedule an appointment.  Please make sure when you call that you mention that you are scheduling your Annual Wellness Visit with the clinical pharmacist so that the appointment may be made for the proper length of time.     Continue current medications. Continue good therapeutic lifestyle changes which include good diet and exercise. Fall precautions discussed with patient. If an FOBT was given today- please return it to our front desk. If you are over 62 years old - you may need Prevnar 62 or the adult Pneumonia vaccine.  **Flu shots are available--- please call and schedule a FLU-CLINIC appointment**  After your visit with [place] today you will receive a survey in the mail or online from [employer] regarding your care with [place]. Please take a moment to fill this out. Your feedback is very  important to [place] as you can help [place] better understand your patient needs as well as improve your experience and satisfaction. WE CARE ABOUT YOU!!!   The patient she use nasal saline as directed 1 spray each nostril at least 3 or 4 times or more daily She should continue to take Mucinex one twice daily with a large glass of water twice daily whether she is coughing or not She should make every effort possible to reduce her cigarette smoking She should take the prednisone as directed She should wait a couple weeks to come back and get her flu shot She should continue to drink plenty of fluids and stay well hydrated

## 2015-03-02 LAB — BMP8+EGFR
BUN/Creatinine Ratio: 12 (ref 11–26)
BUN: 11 mg/dL (ref 8–27)
CO2: 22 mmol/L (ref 18–29)
Calcium: 9.5 mg/dL (ref 8.7–10.3)
Chloride: 100 mmol/L (ref 97–106)
Creatinine, Ser: 0.89 mg/dL (ref 0.57–1.00)
GFR calc Af Amer: 77 mL/min/{1.73_m2} (ref >59)
GFR calc non Af Amer: 67 mL/min/{1.73_m2} (ref >59)
Glucose: 101 mg/dL — ABNORMAL HIGH (ref 65–99)
Potassium: 4.8 mmol/L (ref 3.5–5.2)
Sodium: 140 mmol/L (ref 136–144)

## 2015-03-02 LAB — NMR, LIPOPROFILE
Cholesterol: 129 mg/dL (ref 100–199)
HDL Cholesterol by NMR: 35 mg/dL — ABNORMAL LOW (ref >39)
HDL Particle Number: 26.3 umol/L — ABNORMAL LOW (ref >=30.5)
LDL Particle Number: 1025 nmol/L — ABNORMAL HIGH (ref <1000)
LDL Size: 21 nm (ref >20.5)
LDL-C: 67 mg/dL (ref 0–99)
LP-IR Score: 73 — ABNORMAL HIGH (ref <=45)
Small LDL Particle Number: 505 nmol/L (ref <=527)
Triglycerides by NMR: 137 mg/dL (ref 0–149)

## 2015-03-02 LAB — CBC WITH DIFFERENTIAL/PLATELET
Basophils Absolute: 0.1 10*3/uL (ref 0.0–0.2)
Basos: 0 %
EOS (ABSOLUTE): 0.6 10*3/uL — ABNORMAL HIGH (ref 0.0–0.4)
Eos: 6 %
Hematocrit: 48.1 % — ABNORMAL HIGH (ref 34.0–46.6)
Hemoglobin: 16.8 g/dL — ABNORMAL HIGH (ref 11.1–15.9)
Immature Grans (Abs): 0 10*3/uL (ref 0.0–0.1)
Immature Granulocytes: 0 %
Lymphocytes Absolute: 4.1 10*3/uL — ABNORMAL HIGH (ref 0.7–3.1)
Lymphs: 36 %
MCH: 32 pg (ref 26.6–33.0)
MCHC: 34.9 g/dL (ref 31.5–35.7)
MCV: 92 fL (ref 79–97)
Monocytes Absolute: 0.7 10*3/uL (ref 0.1–0.9)
Monocytes: 6 %
Neutrophils Absolute: 6 10*3/uL (ref 1.4–7.0)
Neutrophils: 52 %
Platelets: 215 10*3/uL (ref 150–379)
RBC: 5.25 x10E6/uL (ref 3.77–5.28)
RDW: 13.5 % (ref 12.3–15.4)
WBC: 11.5 10*3/uL — ABNORMAL HIGH (ref 3.4–10.8)

## 2015-03-02 LAB — HEPATIC FUNCTION PANEL
ALT: 18 IU/L (ref 0–32)
AST: 20 IU/L (ref 0–40)
Albumin: 4.7 g/dL (ref 3.6–4.8)
Alkaline Phosphatase: 74 IU/L (ref 39–117)
Bilirubin Total: 0.2 mg/dL (ref 0.0–1.2)
Bilirubin, Direct: 0.07 mg/dL (ref 0.00–0.40)
Total Protein: 7.3 g/dL (ref 6.0–8.5)

## 2015-03-02 LAB — VITAMIN D 25 HYDROXY (VIT D DEFICIENCY, FRACTURES): Vit D, 25-Hydroxy: 59.7 ng/mL (ref 30.0–100.0)

## 2015-03-03 LAB — THYROID PANEL WITH TSH
Free Thyroxine Index: 3.1 (ref 1.2–4.9)
T3 Uptake Ratio: 28 % (ref 24–39)
T4, Total: 10.9 ug/dL (ref 4.5–12.0)
TSH: 1.23 u[IU]/mL (ref 0.450–4.500)

## 2015-03-03 LAB — SPECIMEN STATUS REPORT

## 2015-03-06 ENCOUNTER — Telehealth: Payer: Self-pay | Admitting: Family Medicine

## 2015-03-06 NOTE — Telephone Encounter (Signed)
Labs have been faxed.

## 2015-03-19 ENCOUNTER — Ambulatory Visit (INDEPENDENT_AMBULATORY_CARE_PROVIDER_SITE_OTHER): Payer: Medicare Other

## 2015-03-19 DIAGNOSIS — Z23 Encounter for immunization: Secondary | ICD-10-CM | POA: Diagnosis not present

## 2015-03-20 ENCOUNTER — Other Ambulatory Visit: Payer: Self-pay | Admitting: Family Medicine

## 2015-05-12 ENCOUNTER — Other Ambulatory Visit: Payer: Self-pay | Admitting: Family Medicine

## 2015-05-12 DIAGNOSIS — E890 Postprocedural hypothyroidism: Secondary | ICD-10-CM | POA: Diagnosis not present

## 2015-05-12 DIAGNOSIS — C73 Malignant neoplasm of thyroid gland: Secondary | ICD-10-CM | POA: Diagnosis not present

## 2015-05-16 ENCOUNTER — Other Ambulatory Visit: Payer: Self-pay | Admitting: Family Medicine

## 2015-05-16 NOTE — Telephone Encounter (Signed)
Last vit D level was 59.7 on 02/28/15.

## 2015-07-08 DIAGNOSIS — H2513 Age-related nuclear cataract, bilateral: Secondary | ICD-10-CM | POA: Diagnosis not present

## 2015-07-11 ENCOUNTER — Encounter: Payer: Self-pay | Admitting: Family Medicine

## 2015-07-11 ENCOUNTER — Ambulatory Visit (INDEPENDENT_AMBULATORY_CARE_PROVIDER_SITE_OTHER): Payer: Medicare Other | Admitting: Family Medicine

## 2015-07-11 VITALS — BP 128/88 | HR 67 | Temp 98.2°F | Ht 65.0 in | Wt 174.0 lb

## 2015-07-11 DIAGNOSIS — E785 Hyperlipidemia, unspecified: Secondary | ICD-10-CM

## 2015-07-11 DIAGNOSIS — Z1211 Encounter for screening for malignant neoplasm of colon: Secondary | ICD-10-CM | POA: Diagnosis not present

## 2015-07-11 DIAGNOSIS — I10 Essential (primary) hypertension: Secondary | ICD-10-CM

## 2015-07-11 DIAGNOSIS — K589 Irritable bowel syndrome without diarrhea: Secondary | ICD-10-CM

## 2015-07-11 DIAGNOSIS — E559 Vitamin D deficiency, unspecified: Secondary | ICD-10-CM

## 2015-07-11 DIAGNOSIS — M7550 Bursitis of unspecified shoulder: Secondary | ICD-10-CM

## 2015-07-11 DIAGNOSIS — M7158 Other bursitis, not elsewhere classified, other site: Secondary | ICD-10-CM

## 2015-07-11 DIAGNOSIS — E8881 Metabolic syndrome: Secondary | ICD-10-CM

## 2015-07-11 NOTE — Patient Instructions (Addendum)
Medicare Annual Wellness Visit  [facility] and the medical providers at [facility] strive to bring you the best medical care.  In doing so we not only want to address your current medical conditions and concerns but also to detect new conditions early and prevent illness, disease and health-related problems.    Medicare offers a yearly Wellness Visit which allows our clinical staff to assess your need for preventative services including immunizations, lifestyle education, counseling to decrease risk of preventable diseases and screening for fall risk and other medical concerns.    This visit is provided free of charge (no copay) for all Medicare recipients. The clinical pharmacists at [facility] have begun to conduct these Wellness Visits which will also include a thorough review of all your medications.    As you primary medical provider recommend that you make an appointment for your Annual Wellness Visit if you have not done so already this year.  You may set up this appointment before you leave today or you may call back [phone] and schedule an appointment.  Please make sure when you call that you mention that you are scheduling your Annual Wellness Visit with the clinical pharmacist so that the appointment may be made for the proper length of time.     Continue current medications. Continue good therapeutic lifestyle changes which include good diet and exercise. Fall precautions discussed with patient. If an FOBT was given today- please return it to our front desk. If you are over 5 years old - you may need Prevnar 45 or the adult Pneumonia vaccine.  **Flu shots are available--- please call and schedule a FLU-CLINIC appointment**  After your visit with [place] today you will receive a survey in the mail or online from [employer] regarding your care with [place]. Please take a moment to fill this out. Your feedback is very  important to [place] as you can help [place] better understand your patient needs as well as improve your experience and satisfaction. WE CARE ABOUT YOU!!!   Use warm wet compresses to left shoulder blade area 20 minutes 3 or 4 times daily Take ibuprofen to 3 times daily after meals for 7-10 days as long as it does not irritate the stomach Avoid any lifting pushing pulling or vacuum cleaning Continue to follow-up with Dr. Tod Persia Continue to make all efforts at stopping smoking--- pray about it and just do it! Monitor blood pressures at home over the next 4 weeks and bring readings by for review and bring her cuff by for a check in the office. Continue to watch sodium intake

## 2015-07-11 NOTE — Progress Notes (Signed)
Subjective:    Patient ID: Madison [name], female    DOB: October 23, 1945, 70 y.o.   MRN: [id number]  HPI Pt here for follow up and management of chronic medical problems which includes hyperlipidemia. She is taking medications regularly.The patient describes some pain in her left posterior shoulder near the scapular for about 1 week. She does not recall injuring or hurting it in anyway. It seems to hurt more when she turns to the left. She is up-to-date on her chest x-rays. She denies any chest pain or shortness of breath. She has no trouble with her GI tract other than her continued issues with her irritable bowel syndrome which is off and on chronic constipation and diarrhea. The stools are normal in color and consistency and she has not seen any blood or had any black tarry bowel movements. She is passing her water without problems. She continues to be followed by the cardiologist yearly and by the endocrinologist yearly.     Patient Active Problem List   Diagnosis Date Noted  . Metabolic syndrome 09/32/3557  . Leukocytosis 01/04/2013  . TOBACCO ABUSE 10/29/2009  . THYROID CANCER 11/30/2007  . Hyperlipidemia 11/30/2007  . HTN (hypertension) 11/30/2007  . Coronary atherosclerosis 11/30/2007  . IRRITABLE BOWEL SYNDROME 11/30/2007  . Osteopenia 11/30/2007   Outpatient Encounter Prescriptions as of 07/11/2015  Medication Sig  . aspirin 81 MG tablet Take 1 tablet (81 mg total) by mouth daily.  . Diphenhydramine-APAP, sleep, (TYLENOL PM EXTRA STRENGTH PO) Take by mouth as needed.    . fenofibrate 160 MG tablet TAKE ONE (1) TABLET EACH DAY  . losartan (COZAAR) 100 MG tablet Take 1 tablet (100 mg total) by mouth daily.  . metoprolol (LOPRESSOR) 50 MG tablet TAKE 1/2 TABLET TWICE DAILY  . nitroGLYCERIN (NITROSTAT) 0.4 MG SL tablet Place 1 tablet (0.4 mg total) under the tongue every 5 (five) minutes as needed for chest pain.  . rosuvastatin (CRESTOR) 40 MG tablet Take 1 tablet (40 mg total) by  mouth daily. As directed  . SYNTHROID 137 MCG tablet Take 1 tablet (137 mcg total) by mouth daily before breakfast. TAKE 1 TABLET DAILY  . Vitamin D, Ergocalciferol, (DRISDOL) 50000 units CAPS capsule TAKE 1 CAPSULE EVERY WEEK  . [DISCONTINUED] niacin (NIASPAN) 1000 MG CR tablet TAKE ONE TABLET DAILY AT BEDTIME  . [DISCONTINUED] predniSONE (DELTASONE) 10 MG tablet 1 tablet 4 times a day for 2 days,  1 tablet 3 times a day for 2 days,  1 tablet 2 times a day for 2 days, 1 tablet daily for 2 days   No facility-administered encounter medications on file as of 07/11/2015.     Review of Systems  Constitutional: Negative.   HENT: Negative.   Eyes: Negative.   Respiratory: Negative.   Cardiovascular: Negative.   Gastrointestinal: Negative.   Endocrine: Negative.   Genitourinary: Negative.   Musculoskeletal: Positive for arthralgias (left shoulder blade / back pain ).  Skin: Negative.   Allergic/Immunologic: Negative.   Neurological: Negative.   Hematological: Negative.   Psychiatric/Behavioral: Negative.        Objective:   Physical Exam  Constitutional: She is oriented to person, place, and time. She appears well-developed and well-nourished.  HENT:  Head: Normocephalic.  Right Ear: External ear normal.  Left Ear: External ear normal.  Nose: Nose normal.  Mouth/Throat: Oropharynx is clear and moist. No oropharyngeal exudate.  Eyes: Conjunctivae and EOM are normal. Pupils are equal, round, and reactive to light. Right eye exhibits  no discharge. Left eye exhibits no discharge. No scleral icterus.  Neck: Normal range of motion. Neck supple. No thyromegaly present.  No anterior cervical adenopathy or thyromegaly  Cardiovascular: Normal rate, regular rhythm, normal heart sounds and intact distal pulses.   No murmur heard. Heart is regular at 72/m  Pulmonary/Chest: Effort normal and breath sounds normal. No respiratory distress. She has no wheezes. She has no rales. She exhibits  tenderness.  Clear anteriorly and posteriorly. The patient is tender along the medial border of the right scapula with applied pressure.  Abdominal: Soft. Bowel sounds are normal. She exhibits no mass. There is no tenderness. There is no rebound and no guarding.  No abdominal tenderness or masses  Musculoskeletal: Normal range of motion. She exhibits tenderness. She exhibits no edema.  Left medial scapular tenderness  Lymphadenopathy:    She has no cervical adenopathy.  Neurological: She is alert and oriented to person, place, and time. She has normal reflexes. No cranial nerve deficit.  Skin: Skin is warm and dry. No rash noted.  Psychiatric: She has a normal mood and affect. Her behavior is normal. Judgment and thought content normal.  Nursing note and vitals reviewed.  BP 161/95 mmHg  Pulse 67  Temp(Src) 98.2 F (36.8 C) (Oral)  Ht _0  (1.651 m)  Wt 174 lb (78.926 kg)  BMI 28.96 kg/m2        Assessment & Plan:  1. Essential hypertension -Repeat blood pressure today with a large cuff in the left arm sitting was 128/86. The patient will bring readings from home in about 4 weeks and bring her cuff to the office at the same time so we can review those readings and make sure that her cuff is getting readings that are comparable to hours in the office. She'll continue to watch her sodium intake. - BMP8+EGFR - CBC with Differential/Platelet - Hepatic function panel  2. Hyperlipidemia -Continue aggressive therapeutic lifestyle changes and current treatment pending results of lab work - CBC with Differential/Platelet - NMR, lipoprofile  3. Metabolic syndrome -Continue to make every effort at watching waiting getting more exercise and watching the diet more closely - BMP8+EGFR - CBC with Differential/Platelet  4. Vitamin D deficiency -Continue current treatment pending results of lab work - CBC with Differential/Platelet - VITAMIN D 25 Hydroxy (Vit-D Deficiency,  Fractures)  5. Special screening for malignant neoplasms, colon - Fecal occult blood, imunochemical; Future  6. IBS (irritable bowel syndrome) -The patient is stable with this and no additional complaints  7. Subscapular bursitis -Use warm wet compresses 20 minutes 3 or 4 times daily, take ibuprofen 2 pills 3 times daily after meals for 7-10 days, avoid heavy lifting pushing pulling a vacuum cleaning  No orders of the defined types were placed in this encounter.   Patient Instructions                       Medicare Annual Wellness Visit  [facility] and the medical providers at [facility] strive to bring you the best medical care.  In doing so we not only want to address your current medical conditions and concerns but also to detect new conditions early and prevent illness, disease and health-related problems.    Medicare offers a yearly Wellness Visit which allows our clinical staff to assess your need for preventative services including immunizations, lifestyle education, counseling to decrease risk of preventable diseases and screening for fall risk and other medical concerns.  This visit is provided free of charge (no copay) for all Medicare recipients. The clinical pharmacists at [facility] have begun to conduct these Wellness Visits which will also include a thorough review of all your medications.    As you primary medical provider recommend that you make an appointment for your Annual Wellness Visit if you have not done so already this year.  You may set up this appointment before you leave today or you may call back [phone]) and schedule an appointment.  Please make sure when you call that you mention that you are scheduling your Annual Wellness Visit with the clinical pharmacist so that the appointment may be made for the proper length of time.     Continue current medications. Continue good therapeutic lifestyle changes which  include good diet and exercise. Fall precautions discussed with patient. If an FOBT was given today- please return it to our front desk. If you are over 68 years old - you may need Prevnar 10 or the adult Pneumonia vaccine.  **Flu shots are available--- please call and schedule a FLU-CLINIC appointment**  After your visit with [place] today you will receive a survey in the mail or online from [employer] regarding your care with [place]. Please take a moment to fill this out. Your feedback is very important to [place] as you can help [place] better understand your patient needs as well as improve your experience and satisfaction. WE CARE ABOUT YOU!!!   Use warm wet compresses to left shoulder blade area 20 minutes 3 or 4 times daily Take ibuprofen to 3 times daily after meals for 7-10 days as long as it does not irritate the stomach Avoid any lifting pushing pulling or vacuum cleaning Continue to follow-up with Dr. Tod Persia Continue to make all efforts at stopping smoking--- pray about it and just do it! Monitor blood pressures at home over the next 4 weeks and bring readings by for review and bring her cuff by for a check in the office. Continue to watch sodium intake   Arrie Senate MD

## 2015-07-12 LAB — HEPATIC FUNCTION PANEL
ALT: 26 IU/L (ref 0–32)
AST: 22 IU/L (ref 0–40)
Albumin: 4.7 g/dL (ref 3.6–4.8)
Alkaline Phosphatase: 52 IU/L (ref 39–117)
Bilirubin Total: 0.3 mg/dL (ref 0.0–1.2)
Bilirubin, Direct: 0.11 mg/dL (ref 0.00–0.40)
Total Protein: 7.3 g/dL (ref 6.0–8.5)

## 2015-07-12 LAB — NMR, LIPOPROFILE
Cholesterol: 136 mg/dL (ref 100–199)
HDL Cholesterol by NMR: 26 mg/dL — ABNORMAL LOW (ref >39)
HDL Particle Number: 23.3 umol/L — ABNORMAL LOW (ref >=30.5)
LDL Particle Number: 1220 nmol/L — ABNORMAL HIGH (ref <1000)
LDL Size: 21.3 nm (ref >20.5)
LDL-C: 79 mg/dL (ref 0–99)
LP-IR Score: 71 — ABNORMAL HIGH (ref <=45)
Small LDL Particle Number: 538 nmol/L — ABNORMAL HIGH (ref <=527)
Triglycerides by NMR: 154 mg/dL — ABNORMAL HIGH (ref 0–149)

## 2015-07-12 LAB — CBC WITH DIFFERENTIAL/PLATELET
Basophils Absolute: 0.1 10*3/uL (ref 0.0–0.2)
Basos: 1 %
EOS (ABSOLUTE): 0.5 10*3/uL — ABNORMAL HIGH (ref 0.0–0.4)
Eos: 5 %
Hematocrit: 47.4 % — ABNORMAL HIGH (ref 34.0–46.6)
Hemoglobin: 16 g/dL — ABNORMAL HIGH (ref 11.1–15.9)
Immature Grans (Abs): 0 10*3/uL (ref 0.0–0.1)
Immature Granulocytes: 0 %
Lymphocytes Absolute: 3.6 10*3/uL — ABNORMAL HIGH (ref 0.7–3.1)
Lymphs: 38 %
MCH: 31.3 pg (ref 26.6–33.0)
MCHC: 33.8 g/dL (ref 31.5–35.7)
MCV: 93 fL (ref 79–97)
Monocytes Absolute: 0.7 10*3/uL (ref 0.1–0.9)
Monocytes: 7 %
Neutrophils Absolute: 4.7 10*3/uL (ref 1.4–7.0)
Neutrophils: 49 %
Platelets: 295 10*3/uL (ref 150–379)
RBC: 5.12 x10E6/uL (ref 3.77–5.28)
RDW: 12.9 % (ref 12.3–15.4)
WBC: 9.5 10*3/uL (ref 3.4–10.8)

## 2015-07-12 LAB — VITAMIN D 25 HYDROXY (VIT D DEFICIENCY, FRACTURES): Vit D, 25-Hydroxy: 57.7 ng/mL (ref 30.0–100.0)

## 2015-07-12 LAB — BMP8+EGFR
BUN/Creatinine Ratio: 19 (ref 11–26)
BUN: 18 mg/dL (ref 8–27)
CO2: 19 mmol/L (ref 18–29)
Calcium: 9.6 mg/dL (ref 8.7–10.3)
Chloride: 103 mmol/L (ref 96–106)
Creatinine, Ser: 0.97 mg/dL (ref 0.57–1.00)
GFR calc Af Amer: 69 mL/min/{1.73_m2} (ref >59)
GFR calc non Af Amer: 60 mL/min/{1.73_m2} (ref >59)
Glucose: 94 mg/dL (ref 65–99)
Potassium: 4.8 mmol/L (ref 3.5–5.2)
Sodium: 140 mmol/L (ref 134–144)

## 2015-07-23 ENCOUNTER — Encounter: Payer: Self-pay | Admitting: Family Medicine

## 2015-07-23 ENCOUNTER — Ambulatory Visit (INDEPENDENT_AMBULATORY_CARE_PROVIDER_SITE_OTHER): Payer: Medicare Other | Admitting: Family Medicine

## 2015-07-23 VITALS — BP 183/96 | HR 76 | Temp 98.4°F | Ht 65.0 in | Wt 173.8 lb

## 2015-07-23 DIAGNOSIS — I10 Essential (primary) hypertension: Secondary | ICD-10-CM

## 2015-07-23 DIAGNOSIS — S39012D Strain of muscle, fascia and tendon of lower back, subsequent encounter: Secondary | ICD-10-CM

## 2015-07-23 MED ORDER — NITROGLYCERIN 0.4 MG SL SUBL: 0.4000 mg | SUBLINGUAL_TABLET | SUBLINGUAL | Status: DC | PRN

## 2015-07-23 MED ORDER — CYCLOBENZAPRINE HCL 10 MG PO TABS: 10.0000 mg | ORAL_TABLET | Freq: Three times a day (TID) | ORAL | Status: DC | PRN

## 2015-07-23 NOTE — Progress Notes (Signed)
BP 183/96 mmHg  Pulse 76  Temp(Src) 98.4 F (36.9 C) (Oral)  Ht 5\' 5"  (1.651 m)  Wt 173 lb 12.8 oz (78.835 kg)  BMI 28.92 kg/m2   Subjective:    Patient ID: Madison [name], female    DOB: 10-Oct-1945, 70 y.o.   MRN: [id number]  HPI: Madison [name] is a 70 y.o. female presenting on 07/23/2015 for Pain in left shoulderblade area   HPI Pain in her back under her left shoulder blade. Patient comes in today with persistent pain in her left mid back under her left shoulder blade. She was seen by a provider here with [place] and recommended anti-inflammatories and heat compresses and it has not improved over this past week. She has been taking ibuprofen 400 mg 3 times a day without much success. The pain stays in that area and does not radiate anywhere else. The pain is been going on for a total of 2-1/2 weeks. She denies any numbness or weakness or overlying skin changes. The pain is worse with movement, especially over her head movement with that left arm.  Nitroglycerin refill requested  Relevant past medical, surgical, family and social history reviewed and updated as indicated. Interim medical history since our last visit reviewed. Allergies and medications reviewed and updated.  Review of Systems  Constitutional: Negative for fever and chills.  HENT: Negative for congestion, ear discharge and ear pain.   Eyes: Negative for redness and visual disturbance.  Respiratory: Negative for chest tightness and shortness of breath.   Cardiovascular: Negative for chest pain and leg swelling.  Genitourinary: Negative for dysuria and difficulty urinating.  Musculoskeletal: Positive for myalgias and back pain. Negative for gait problem.  Skin: Negative for rash.  Neurological: Negative for light-headedness and headaches.  Psychiatric/Behavioral: Negative for behavioral problems and agitation.  All other systems reviewed and are negative.   Per HPI unless specifically indicated above       Medication List       This list is accurate as of: 07/23/15  3:08 PM.  Always use your most recent med list.               aspirin 81 MG tablet  Take 1 tablet (81 mg total) by mouth daily.     cyclobenzaprine 10 MG tablet  Commonly known as:  FLEXERIL  Take 1 tablet (10 mg total) by mouth 3 (three) times daily as needed for muscle spasms.     fenofibrate 160 MG tablet  TAKE ONE (1) TABLET EACH DAY     losartan 100 MG tablet  Commonly known as:  COZAAR  Take 1 tablet (100 mg total) by mouth daily.     Melatonin 3 MG Caps  Take 3 mg by mouth at bedtime.     metoprolol 50 MG tablet  Commonly known as:  LOPRESSOR  TAKE 1/2 TABLET TWICE DAILY     nitroGLYCERIN 0.4 MG SL tablet  Commonly known as:  NITROSTAT  Place 1 tablet (0.4 mg total) under the tongue every 5 (five) minutes as needed for chest pain.     rosuvastatin 40 MG tablet  Commonly known as:  CRESTOR  Take 1 tablet (40 mg total) by mouth daily. As directed     SYNTHROID 137 MCG tablet  Generic drug:  levothyroxine  Take 1 tablet (137 mcg total) by mouth daily before breakfast. TAKE 1 TABLET DAILY     Vitamin D (Ergocalciferol) 50000 units Caps capsule  Commonly known as:  DRISDOL  TAKE 1 CAPSULE EVERY WEEK           Objective:    BP 183/96 mmHg  Pulse 76  Temp(Src) 98.4 F (36.9 C) (Oral)  Ht 5\' 5"  (1.651 m)  Wt 173 lb 12.8 oz (78.835 kg)  BMI 28.92 kg/m2  Wt Readings from Last 3 Encounters:  07/23/15 173 lb 12.8 oz (78.835 kg)  07/11/15 174 lb (78.926 kg)  02/28/15 172 lb (78.019 kg)    Physical Exam  Constitutional: She is oriented to person, place, and time. She appears well-developed and well-nourished. No distress.  Eyes: Conjunctivae and EOM are normal. Pupils are equal, round, and reactive to light.  Cardiovascular: Normal rate, regular rhythm, normal heart sounds and intact distal pulses.   No murmur heard. Pulmonary/Chest: Effort normal and breath sounds normal. No respiratory  distress. She has no wheezes.  Musculoskeletal: Normal range of motion. She exhibits no edema.       Thoracic back: She exhibits tenderness and spasm. She exhibits normal range of motion, no bony tenderness, no swelling and no deformity.       Back:  Neurological: She is alert and oriented to person, place, and time. Coordination normal.  Skin: Skin is warm and dry. No rash noted. She is not diaphoretic.  Psychiatric: She has a normal mood and affect. Her behavior is normal.  Nursing note and vitals reviewed.      Assessment & Plan:   Problem List Items Addressed This Visit      Cardiovascular and Mediastinum   HTN (hypertension) - Primary   Relevant Medications   nitroGLYCERIN (NITROSTAT) 0.4 MG SL tablet    Other Visit Diagnoses    Back strain, subsequent encounter        Use tennis ball, ibuprofen 600 mg 3 times a day, Flexeril and ice water bottles and heat compresses.    Relevant Medications    cyclobenzaprine (FLEXERIL) 10 MG tablet      Needs refill nitroglycerin  Follow up plan: Return if symptoms worsen or fail to improve.  Counseling provided for all of the vaccine components No orders of the defined types were placed in this encounter.    Madison Pina, MD [facility] 07/23/2015, 3:08 PM

## 2015-09-09 ENCOUNTER — Other Ambulatory Visit: Payer: Self-pay | Admitting: Family Medicine

## 2015-09-15 ENCOUNTER — Other Ambulatory Visit: Payer: Self-pay | Admitting: Family Medicine

## 2015-11-05 ENCOUNTER — Other Ambulatory Visit: Payer: Self-pay | Admitting: Family Medicine

## 2015-11-11 ENCOUNTER — Other Ambulatory Visit: Payer: Self-pay | Admitting: Family Medicine

## 2015-11-28 ENCOUNTER — Ambulatory Visit (INDEPENDENT_AMBULATORY_CARE_PROVIDER_SITE_OTHER): Payer: Medicare Other | Admitting: Family Medicine

## 2015-11-28 ENCOUNTER — Encounter: Payer: Self-pay | Admitting: Family Medicine

## 2015-11-28 VITALS — BP 140/86 | HR 65 | Temp 98.1°F | Ht 65.0 in | Wt 172.0 lb

## 2015-11-28 DIAGNOSIS — F17200 Nicotine dependence, unspecified, uncomplicated: Secondary | ICD-10-CM

## 2015-11-28 DIAGNOSIS — J410 Simple chronic bronchitis: Secondary | ICD-10-CM | POA: Diagnosis not present

## 2015-11-28 DIAGNOSIS — I10 Essential (primary) hypertension: Secondary | ICD-10-CM

## 2015-11-28 DIAGNOSIS — I2510 Atherosclerotic heart disease of native coronary artery without angina pectoris: Secondary | ICD-10-CM

## 2015-11-28 DIAGNOSIS — I2583 Coronary atherosclerosis due to lipid rich plaque: Secondary | ICD-10-CM

## 2015-11-28 DIAGNOSIS — E785 Hyperlipidemia, unspecified: Secondary | ICD-10-CM | POA: Diagnosis not present

## 2015-11-28 DIAGNOSIS — E559 Vitamin D deficiency, unspecified: Secondary | ICD-10-CM

## 2015-11-28 DIAGNOSIS — E8881 Metabolic syndrome: Secondary | ICD-10-CM

## 2015-11-28 DIAGNOSIS — C73 Malignant neoplasm of thyroid gland: Secondary | ICD-10-CM | POA: Diagnosis not present

## 2015-11-28 NOTE — Progress Notes (Signed)
Subjective:    Patient ID: Madison [name], female    DOB: Jul 26, 1945, 70 y.o.   MRN: [id number]  HPI Pt here for follow up and management of chronic medical problems which includes hypertension and hyperlipidemia. She is taking medications regularly.This patient has coronary artery disease and has had an MI in the past. She also has had thyroid cancer. She is followed regularly by the endocrinologist as well as the cardiologist. She does continue to smoke. She is concerned today about a sore area on the bottom of her right foot. She is due for lab work and she is up-to-date with all other parameters. Her weight is down a couple pounds since the last visit. The patient denies any chest pain or shortness of breath. She has no trouble with swallowing heartburn indigestion nausea vomiting diarrhea or blood in the stool. She denies any voiding issues. She sees Dr. Tod Persia for her thyroid cancer on a yearly basis and he is now with [facility]. She sees Dr. Tod Persia yearly and she sees the cardiologist yearly. He looks like the last visit with the cardiologist was in June 2016.     Patient Active Problem List   Diagnosis Date Noted  . Metabolic syndrome 62/56/3893  . Leukocytosis 01/04/2013  . TOBACCO ABUSE 10/29/2009  . THYROID CANCER 11/30/2007  . Hyperlipidemia 11/30/2007  . HTN (hypertension) 11/30/2007  . Coronary atherosclerosis 11/30/2007  . IRRITABLE BOWEL SYNDROME 11/30/2007  . Osteopenia 11/30/2007   Outpatient Encounter Prescriptions as of 11/28/2015  Medication Sig  . aspirin 81 MG tablet Take 1 tablet (81 mg total) by mouth daily.  . fenofibrate 160 MG tablet TAKE ONE (1) TABLET EACH DAY  . losartan (COZAAR) 100 MG tablet TAKE ONE (1) TABLET EACH DAY  . Melatonin 3 MG CAPS Take 3 mg by mouth at bedtime.  . metoprolol (LOPRESSOR) 50 MG tablet TAKE 1/2 TABLET TWICE DAILY  . nitroGLYCERIN (NITROSTAT) 0.4 MG SL tablet Place 1 tablet (0.4 mg total) under the tongue every 5 (five) minutes as  needed for chest pain.  . rosuvastatin (CRESTOR) 40 MG tablet TAKE ONE TABLET DAILY AS DIRECTED  . SYNTHROID 137 MCG tablet Take 1 tablet (137 mcg total) by mouth daily before breakfast. TAKE 1 TABLET DAILY  . Vitamin D, Ergocalciferol, (DRISDOL) 50000 units CAPS capsule TAKE 1 CAPSULE EVERY WEEK  . [DISCONTINUED] cyclobenzaprine (FLEXERIL) 10 MG tablet Take 1 tablet (10 mg total) by mouth 3 (three) times daily as needed for muscle spasms.   No facility-administered encounter medications on file as of 11/28/2015.       Review of Systems  Constitutional: Negative.   HENT: Negative.   Eyes: Negative.   Respiratory: Negative.   Cardiovascular: Negative.   Gastrointestinal: Negative.   Endocrine: Negative.   Genitourinary: Negative.   Musculoskeletal: Negative.   Skin: Negative.        Sore area - right sole of foot  Allergic/Immunologic: Negative.   Neurological: Negative.   Hematological: Negative.   Psychiatric/Behavioral: Negative.        Objective:   Physical Exam  Constitutional: She is oriented to person, place, and time. She appears well-developed and well-nourished. No distress.  HENT:  Head: Normocephalic and atraumatic.  Right Ear: External ear normal.  Left Ear: External ear normal.  Nose: Nose normal.  Mouth/Throat: Oropharynx is clear and moist. No oropharyngeal exudate.  Eyes: Conjunctivae and EOM are normal. Pupils are equal, round, and reactive to light. Right eye exhibits no discharge. Left eye exhibits  no discharge. No scleral icterus.  Neck: Normal range of motion. Neck supple. No thyromegaly present.  No nodules or thyroid masses  Cardiovascular: Normal rate, regular rhythm, normal heart sounds and intact distal pulses.   No murmur heard. Heart is 72/m with a regular rate and rhythm  Pulmonary/Chest: Effort normal and breath sounds normal. No respiratory distress. She has no wheezes. She has no rales. She exhibits no tenderness.  Clear anteriorly and  posteriorly  Abdominal: Soft. Bowel sounds are normal. She exhibits no mass. There is no tenderness. There is no rebound and no guarding.  Nontender with no masses or organ enlargement  Musculoskeletal: Normal range of motion. She exhibits no edema.  The bottom of the foot on the right was slightly tender in the area that she was concern with and there was a small little blood vessel there is appears to have had may be some type of foreign body irritation which is now getting better. There is no redness or irritation.  Lymphadenopathy:    She has no cervical adenopathy.  Neurological: She is alert and oriented to person, place, and time. She has normal reflexes. No cranial nerve deficit.  Skin: Skin is warm and dry. No rash noted.  Dry skin  Psychiatric: She has a normal mood and affect. Her behavior is normal. Judgment and thought content normal.  Nursing note and vitals reviewed.  BP 140/86 (BP Location: Left Arm)   Pulse 65   Temp 98.1 F (36.7 C) (Oral)   Ht 5' 5" (1.651 m)   Wt 172 lb (78 kg)   BMI 28.62 kg/m         Assessment & Plan:  1. Essential hypertension -The blood pressure is slightly on the elevated side on the systolic reading at 888 today. She says that at home her blood pressures are always in the 120s over the 70s to 80s. She will continue with current treatment and sodium restriction. - BMP8+EGFR - CBC with Differential/Platelet - Hepatic function panel  2. Hyperlipidemia -Continue with aggressive therapeutic lifestyle changes and Crestor and work on getting the weight down as low as possible pending results of lab work - CBC with Differential/Platelet - NMR, lipoprofile  3. Vitamin D deficiency -Continue with current treatment pending results of lab work - CBC with Differential/Platelet - VITAMIN D 25 Hydroxy (Vit-D Deficiency, Fractures)  4. Metabolic syndrome -The weight is asked to down a couple pounds since the last visit but the patient will make  every effort at diet and exercise. - BMP8+EGFR - CBC with Differential/Platelet  5. Thyroid cancer [facility]) - she sees Dr. Tod Persia yearly. He does for thyroid profile and monitors her thyroid cancer.  6. TOBACCO ABUSE -She understands the importance of working on smoking cessation  7. Simple chronic bronchitis ([place]) -Stop smoking exercise more regularly  8. Coronary artery disease due to lipid rich plaque -Follow-up with cardiology. It looks like her appointment is due   9. Plantar foot irritation, we will continue to monitor.  Patient Instructions                       Medicare Annual Wellness Visit  [facility] and the medical providers at [facility] strive to bring you the best medical care.  In doing so we not only want to address your current medical conditions and concerns but also to detect new conditions early and prevent illness, disease and health-related problems.    Medicare offers a  yearly Wellness Visit which allows our clinical staff to assess your need for preventative services including immunizations, lifestyle education, counseling to decrease risk of preventable diseases and screening for fall risk and other medical concerns.    This visit is provided free of charge (no copay) for all Medicare recipients. The clinical pharmacists at [facility] have begun to conduct these Wellness Visits which will also include a thorough review of all your medications.    As you primary medical provider recommend that you make an appointment for your Annual Wellness Visit if you have not done so already this year.  You may set up this appointment before you leave today or you may call back [phone]) and schedule an appointment.  Please make sure when you call that you mention that you are scheduling your Annual Wellness Visit with the clinical pharmacist so that the appointment may be made for the proper length of time.     Continue  current medications. Continue good therapeutic lifestyle changes which include good diet and exercise. Fall precautions discussed with patient. If an FOBT was given today- please return it to our front desk. If you are over 50 years old - you may need Prevnar 13 or the adult Pneumonia vaccine.  After your visit with us today you will receive a survey in the mail or online from [employer] regarding your care with us. Please take a moment to fill this out. Your feedback is very important to us as you can help us better understand your patient needs as well as improve your experience and satisfaction. WE CARE ABOUT YOU!!!   Stay active and drink plenty of fluids Continue to try to work at stopping smoking Follow-up with cardiology Follow-up with endocrinologist because of thyroid cancer Continue to monitor blood pressures regularly and watch sodium intake    Don W. Moore MD   

## 2015-11-28 NOTE — Patient Instructions (Addendum)
Medicare Annual Wellness Visit  [facility] and the medical providers at [facility] strive to bring you the best medical care.  In doing so we not only want to address your current medical conditions and concerns but also to detect new conditions early and prevent illness, disease and health-related problems.    Medicare offers a yearly Wellness Visit which allows our clinical staff to assess your need for preventative services including immunizations, lifestyle education, counseling to decrease risk of preventable diseases and screening for fall risk and other medical concerns.    This visit is provided free of charge (no copay) for all Medicare recipients. The clinical pharmacists at [facility] have begun to conduct these Wellness Visits which will also include a thorough review of all your medications.    As you primary medical provider recommend that you make an appointment for your Annual Wellness Visit if you have not done so already this year.  You may set up this appointment before you leave today or you may call back [phone] and schedule an appointment.  Please make sure when you call that you mention that you are scheduling your Annual Wellness Visit with the clinical pharmacist so that the appointment may be made for the proper length of time.     Continue current medications. Continue good therapeutic lifestyle changes which include good diet and exercise. Fall precautions discussed with patient. If an FOBT was given today- please return it to our front desk. If you are over 74 years old - you may need Prevnar 40 or the adult Pneumonia vaccine.  After your visit with [place] today you will receive a survey in the mail or online from [employer] regarding your care with [place]. Please take a moment to fill this out. Your feedback is very important to [place] as you can help [place] better understand your patient needs as well as improve  your experience and satisfaction. WE CARE ABOUT YOU!!!   Stay active and drink plenty of fluids Continue to try to work at stopping smoking Follow-up with cardiology Follow-up with endocrinologist because of thyroid cancer Continue to monitor blood pressures regularly and watch sodium intake

## 2015-11-29 LAB — CBC WITH DIFFERENTIAL/PLATELET
Basophils Absolute: 0.1 10*3/uL (ref 0.0–0.2)
Basos: 1 %
EOS (ABSOLUTE): 0.3 10*3/uL (ref 0.0–0.4)
Eos: 3 %
Hematocrit: 49.3 % — ABNORMAL HIGH (ref 34.0–46.6)
Hemoglobin: 16 g/dL — ABNORMAL HIGH (ref 11.1–15.9)
Immature Grans (Abs): 0 10*3/uL (ref 0.0–0.1)
Immature Granulocytes: 0 %
Lymphocytes Absolute: 3.4 10*3/uL — ABNORMAL HIGH (ref 0.7–3.1)
Lymphs: 34 %
MCH: 30.5 pg (ref 26.6–33.0)
MCHC: 32.5 g/dL (ref 31.5–35.7)
MCV: 94 fL (ref 79–97)
Monocytes Absolute: 0.6 10*3/uL (ref 0.1–0.9)
Monocytes: 6 %
Neutrophils Absolute: 5.7 10*3/uL (ref 1.4–7.0)
Neutrophils: 56 %
Platelets: 281 10*3/uL (ref 150–379)
RBC: 5.25 x10E6/uL (ref 3.77–5.28)
RDW: 13.8 % (ref 12.3–15.4)
WBC: 10.1 10*3/uL (ref 3.4–10.8)

## 2015-11-29 LAB — BMP8+EGFR
BUN/Creatinine Ratio: 17 (ref 12–28)
BUN: 16 mg/dL (ref 8–27)
CO2: 22 mmol/L (ref 18–29)
Calcium: 9.5 mg/dL (ref 8.7–10.3)
Chloride: 102 mmol/L (ref 96–106)
Creatinine, Ser: 0.94 mg/dL (ref 0.57–1.00)
GFR calc Af Amer: 72 mL/min/{1.73_m2} (ref >59)
GFR calc non Af Amer: 62 mL/min/{1.73_m2} (ref >59)
Glucose: 87 mg/dL (ref 65–99)
Potassium: 5.2 mmol/L (ref 3.5–5.2)
Sodium: 141 mmol/L (ref 134–144)

## 2015-11-29 LAB — VITAMIN D 25 HYDROXY (VIT D DEFICIENCY, FRACTURES): Vit D, 25-Hydroxy: 43.5 ng/mL (ref 30.0–100.0)

## 2015-11-29 LAB — HEPATIC FUNCTION PANEL
ALT: 29 IU/L (ref 0–32)
AST: 27 IU/L (ref 0–40)
Albumin: 4.8 g/dL (ref 3.6–4.8)
Alkaline Phosphatase: 49 IU/L (ref 39–117)
Bilirubin Total: 0.3 mg/dL (ref 0.0–1.2)
Bilirubin, Direct: 0.1 mg/dL (ref 0.00–0.40)
Total Protein: 7.5 g/dL (ref 6.0–8.5)

## 2015-11-29 LAB — NMR, LIPOPROFILE
Cholesterol: 132 mg/dL (ref 100–199)
HDL Cholesterol by NMR: 23 mg/dL — ABNORMAL LOW (ref >39)
HDL Particle Number: 24 umol/L — ABNORMAL LOW (ref >=30.5)
LDL Particle Number: 1304 nmol/L — ABNORMAL HIGH (ref <1000)
LDL Size: 21.8 nm (ref >20.5)
LDL-C: 75 mg/dL (ref 0–99)
LP-IR Score: 79 — ABNORMAL HIGH (ref <=45)
Small LDL Particle Number: 542 nmol/L — ABNORMAL HIGH (ref <=527)
Triglycerides by NMR: 171 mg/dL — ABNORMAL HIGH (ref 0–149)

## 2015-12-08 NOTE — Progress Notes (Signed)
HPI The patient returns for followup of CAD.  She had a stress test in January of 2014 with no evidence if ischemia.  Her EF was OK.  Since I last saw her she has done okay. The patient denies any new symptoms such as chest discomfort, neck or arm discomfort. There has been no new shortness of breath, PND or orthopnea. There have been no reported palpitations, presyncope or syncope.  She pushes a mower.  Unfortunately she is still smoking.     Allergies  Allergen Reactions  . Banana     Throat and ears itch,ears feel like they swell  . Bupropion Hcl     REACTION: paranoia  . Chantix [Varenicline]     paranoia  . Latex Rash  . Zetia [Ezetimibe]     Myalgia    Current Outpatient Prescriptions  Medication Sig Dispense Refill  . aspirin 81 MG tablet Take 1 tablet (81 mg total) by mouth daily.    . fenofibrate 160 MG tablet TAKE ONE (1) TABLET EACH DAY 90 tablet 0  . losartan (COZAAR) 100 MG tablet TAKE ONE (1) TABLET EACH DAY 30 tablet 3  . Melatonin 3 MG CAPS Take 3 mg by mouth at bedtime.    . metoprolol (LOPRESSOR) 50 MG tablet TAKE 1/2 TABLET TWICE DAILY 30 tablet 3  . nitroGLYCERIN (NITROSTAT) 0.4 MG SL tablet Place 1 tablet (0.4 mg total) under the tongue every 5 (five) minutes as needed for chest pain. 25 tablet 3  . rosuvastatin (CRESTOR) 40 MG tablet TAKE ONE TABLET DAILY AS DIRECTED 30 tablet 1  . SYNTHROID 137 MCG tablet Take 1 tablet (137 mcg total) by mouth daily before breakfast. TAKE 1 TABLET DAILY 34 tablet 6  . Vitamin D, Ergocalciferol, (DRISDOL) 50000 units CAPS capsule TAKE 1 CAPSULE EVERY WEEK 12 capsule 0   No current facility-administered medications for this visit.     Past Medical History:  Diagnosis Date  . CAD (coronary artery disease)    PCI of an occluded right coronary artery 2001. 70% LAD stenosis, 30% circumflex stenosis.  . Diverticulosis   . Hyperlipidemia   . Hypertension   . IBS (irritable bowel syndrome)   . Leukocytosis, unspecified 07/24/2012   . Myocardial infarction ([place]) 2001  . Need for prophylactic hormone replacement therapy (postmenopausal)   . Osteopenia   . Osteoporosis   . Thyroid cancer [facility]) 2004   Dr. Clovia Cuff   . Thyroid nodule 2004  . Tobacco abuse     Past Surgical History:  Procedure Laterality Date  . APPENDECTOMY  1955  . BACK SURGERY  06/2000  . CHOLECYSTECTOMY  07/2002  . LIPOMA EXCISION Right 08/2003   r arm   . THYROIDECTOMY Bilateral 02/2003    ROS:   As stated in the HPI and negative for all other systems.  PHYSICAL EXAM BP (!) 173/94   Pulse 70   Ht 5\' 5"  (1.651 m)   Wt 174 lb (78.9 kg)   BMI 28.96 kg/m  GENERAL:  Well appearing NECK:  No jugular venous distention, waveform within normal limits, carotid upstroke brisk and symmetric, no bruits, no thyromegaly LUNGS:  Clear to auscultation bilaterally BACK:  No CVA tenderness CHEST:  Unremarkable HEART:  PMI not displaced or sustained,S1 and S2 within normal limits, no S3, no S4, no clicks, no rubs, no murmurs ABD:  Flat, positive bowel sounds normal in frequency in pitch, no bruits, no rebound, no guarding, no midline pulsatile mass, no hepatomegaly, no splenomegaly EXT:  2 plus pulses throughout, no edema, no cyanosis no clubbing   EKG:  Sinus rhythm, rate 65, axis within normal limits, intervals within normal limits, no acute ST-T wave changes.  12/10/2015   ASSESSMENT AND PLAN  CORONARY ARTERY DISEASE -  The patient has no new sypmtoms.  No further cardiovascular testing is indicated.  We will continue with aggressive risk reduction and meds as listed.  I will likely screen with a treadmill test next year.  TOBACCO ABUSE -  We again discussed this.   She did not tolerate the patches and Zyban and Chantix did not help.    DYSLIPIDEMIA -  This is followed by Dr. Laurance Flatten.  I will defer to Dr. Laurance Flatten. Her particle size is not excellent but her LDL was only 75. Unfortunately her HDL is 23.  HTN - Slightly elevated although she reports  this is unusual and at home as well controlled.

## 2015-12-09 ENCOUNTER — Encounter: Payer: Self-pay | Admitting: Cardiology

## 2015-12-10 ENCOUNTER — Ambulatory Visit (INDEPENDENT_AMBULATORY_CARE_PROVIDER_SITE_OTHER): Payer: Medicare Other | Admitting: Cardiology

## 2015-12-10 ENCOUNTER — Encounter: Payer: Self-pay | Admitting: Cardiology

## 2015-12-10 VITALS — BP 173/94 | HR 70 | Ht 65.0 in | Wt 174.0 lb

## 2015-12-10 DIAGNOSIS — I2510 Atherosclerotic heart disease of native coronary artery without angina pectoris: Secondary | ICD-10-CM

## 2015-12-10 DIAGNOSIS — I10 Essential (primary) hypertension: Secondary | ICD-10-CM

## 2015-12-10 NOTE — Patient Instructions (Signed)

## 2015-12-16 ENCOUNTER — Other Ambulatory Visit: Payer: Self-pay | Admitting: Family Medicine

## 2015-12-25 ENCOUNTER — Other Ambulatory Visit: Payer: Self-pay | Admitting: Family Medicine

## 2015-12-25 DIAGNOSIS — Z1231 Encounter for screening mammogram for malignant neoplasm of breast: Secondary | ICD-10-CM

## 2016-01-12 ENCOUNTER — Encounter: Payer: Self-pay | Admitting: Family Medicine

## 2016-01-12 ENCOUNTER — Ambulatory Visit (INDEPENDENT_AMBULATORY_CARE_PROVIDER_SITE_OTHER): Payer: Medicare Other | Admitting: Family Medicine

## 2016-01-12 VITALS — BP 147/83 | HR 69 | Temp 97.6°F | Ht 65.0 in | Wt 172.2 lb

## 2016-01-12 DIAGNOSIS — J40 Bronchitis, not specified as acute or chronic: Secondary | ICD-10-CM

## 2016-01-12 DIAGNOSIS — J329 Chronic sinusitis, unspecified: Secondary | ICD-10-CM | POA: Diagnosis not present

## 2016-01-12 DIAGNOSIS — H65112 Acute and subacute allergic otitis media (mucoid) (sanguinous) (serous), left ear: Secondary | ICD-10-CM

## 2016-01-12 MED ORDER — AMOXICILLIN-POT CLAVULANATE 875-125 MG PO TABS: 1.0000 | ORAL_TABLET | Freq: Two times a day (BID) | ORAL | 0 refills | Status: DC

## 2016-01-12 MED ORDER — HYDROCODONE-HOMATROPINE 5-1.5 MG/5ML PO SYRP: 5.0000 mL | ORAL_SOLUTION | Freq: Four times a day (QID) | ORAL | 0 refills | Status: DC | PRN

## 2016-01-12 MED ORDER — BETAMETHASONE SOD PHOS & ACET 6 (3-3) MG/ML IJ SUSP: 6.0000 mg | Freq: Once | INTRAMUSCULAR | Status: DC

## 2016-01-12 NOTE — Progress Notes (Signed)
Subjective:  Patient ID: Madison [name], female    DOB: 11/19/1945  Age: 70 y.o. MRN: [id number]  CC: URI (runny nose, chest congestion, wheezing, not sure of fever, started last Tuesday, started as allergies, continued to get worse, not achey, cough keeps her up at night, productive some, OTC mucinex, zyrtec)   HPI Illana H Azzara presents for Patient presents with upper respiratory congestion. Rhinorrhea that is frequently purulent. There is minimal sore throat. Patient reports coughing frequently as well. Yellow sputum noted. Also wheezing.There is no fever no chills no sweats. The patient denies being short of breath. Onset was 6 days ago. Gradually worsening in spite of mucinex use    History Pricilla has a past medical history of CAD (coronary artery disease); Diverticulosis; Hyperlipidemia; Hypertension; IBS (irritable bowel syndrome); Leukocytosis, unspecified (07/24/2012); Myocardial infarction ([place]) (2001); Need for prophylactic hormone replacement therapy (postmenopausal); Osteopenia; Osteoporosis; Thyroid cancer ([place]) (2004); Thyroid nodule (2004); and Tobacco abuse.   She has a past surgical history that includes Back surgery (06/2000); Cholecystectomy (07/2002); Thyroidectomy (Bilateral, 02/2003); Lipoma excision (Right, 08/2003); and Appendectomy (1955).   Her family history includes Cirrhosis in her mother; Coronary artery disease in her father; Hypertension in her maternal grandmother.She reports that she has been smoking Cigarettes.  She has a 40.00 pack-year smoking history. She has never used smokeless tobacco. She reports that she does not drink alcohol or use drugs.    ROS Review of Systems  Constitutional: Negative for activity change, appetite change, chills and fever.  HENT: Positive for congestion, ear pain, postnasal drip, rhinorrhea and sinus pressure. Negative for ear discharge, hearing loss, nosebleeds, sneezing and trouble swallowing.   Respiratory: Positive for  cough and wheezing. Negative for chest tightness and shortness of breath.   Cardiovascular: Negative for chest pain and palpitations.  Skin: Negative for rash.    Objective:  BP (!) 147/83   Pulse 69   Temp 97.6 F (36.4 C) (Oral)   Ht 5\' 5"  (1.651 m)   Wt 172 lb 3.2 oz (78.1 kg)   BMI 28.66 kg/m   BP Readings from Last 3 Encounters:  01/12/16 (!) 147/83  12/10/15 (!) 173/94  11/28/15 140/86    Wt Readings from Last 3 Encounters:  01/12/16 172 lb 3.2 oz (78.1 kg)  12/10/15 174 lb (78.9 kg)  11/28/15 172 lb (78 kg)     Physical Exam  Constitutional: She appears well-developed and well-nourished.  HENT:  Head: Normocephalic and atraumatic.  Right Ear: Tympanic membrane and external ear normal. No decreased hearing is noted.  Left Ear: External ear normal. Tympanic membrane is erythematous. A middle ear effusion is present. No decreased hearing is noted.  Nose: Mucosal edema present. Right sinus exhibits no frontal sinus tenderness. Left sinus exhibits no frontal sinus tenderness.  Mouth/Throat: No oropharyngeal exudate or posterior oropharyngeal erythema.  Neck: No Brudzinski's sign noted.  Pulmonary/Chest: Breath sounds normal. No respiratory distress.  Lymphadenopathy:       Head (right side): No preauricular adenopathy present.       Head (left side): No preauricular adenopathy present.       Right cervical: No superficial cervical adenopathy present.      Left cervical: No superficial cervical adenopathy present.     Lab Results  Component Value Date   WBC 10.1 11/28/2015   HGB 16.5 (A) 10/28/2014   HCT 49.3 (H) 11/28/2015   PLT 281 11/28/2015   GLUCOSE 87 11/28/2015   CHOL 132 11/28/2015   TRIG 171 (  H) 11/28/2015   HDL 23 (L) 11/28/2015   LDLCALC 69 01/17/2014   ALT 29 11/28/2015   AST 27 11/28/2015   NA 141 11/28/2015   K 5.2 11/28/2015   CL 102 11/28/2015   CREATININE 0.94 11/28/2015   BUN 16 11/28/2015   CO2 22 11/28/2015   TSH 1.230 02/28/2015     HGBA1C 5.7 01/17/2014    Mm Digital Screening Bilateral  Result Date: 01/14/2015 CLINICAL DATA:  Screening. EXAM: DIGITAL SCREENING BILATERAL MAMMOGRAM WITH CAD COMPARISON:  Previous exam(s). ACR Breast Density Category a: The breast tissue is almost entirely fatty. FINDINGS: There are no findings suspicious for malignancy. Images were processed with CAD. IMPRESSION: No mammographic evidence of malignancy. A result letter of this screening mammogram will be mailed directly to the patient. RECOMMENDATION: Screening mammogram in one year. (Code:SM-B-01Y) BI-RADS CATEGORY  1: Negative. Electronically Signed   By: Lovey Newcomer M.D.   On: 01/14/2015 08:29    Assessment & Plan:   Kryslynn was seen today for uri.  Diagnoses and all orders for this visit:  Acute mucoid otitis media of left ear -     betamethasone acetate-betamethasone sodium phosphate (CELESTONE) injection 6 mg; Inject 1 mL (6 mg total) into the muscle once.  Sinobronchitis -     betamethasone acetate-betamethasone sodium phosphate (CELESTONE) injection 6 mg; Inject 1 mL (6 mg total) into the muscle once.  Other orders -     amoxicillin-clavulanate (AUGMENTIN) 875-125 MG tablet; Take 1 tablet by mouth 2 (two) times daily. Take all of this medication -     HYDROcodone-homatropine (HYCODAN) 5-1.5 MG/5ML syrup; Take 5 mLs by mouth every 6 (six) hours as needed for cough.       I am having Ms. Eltringham start on amoxicillin-clavulanate and HYDROcodone-homatropine. I am also having her maintain her SYNTHROID, aspirin, Melatonin, nitroGLYCERIN, metoprolol, losartan, Vitamin D (Ergocalciferol), rosuvastatin, and fenofibrate. We will continue to administer betamethasone acetate-betamethasone sodium phosphate.  Meds ordered this encounter  Medications  . amoxicillin-clavulanate (AUGMENTIN) 875-125 MG tablet    Sig: Take 1 tablet by mouth 2 (two) times daily. Take all of this medication    Dispense:  20 tablet    Refill:  0  .  betamethasone acetate-betamethasone sodium phosphate (CELESTONE) injection 6 mg  . HYDROcodone-homatropine (HYCODAN) 5-1.5 MG/5ML syrup    Sig: Take 5 mLs by mouth every 6 (six) hours as needed for cough.    Dispense:  120 mL    Refill:  0     Follow-up: No Follow-up on file.  Claretta Fraise, M.D.

## 2016-01-14 ENCOUNTER — Ambulatory Visit
Admission: RE | Admit: 2016-01-14 | Discharge: 2016-01-14 | Disposition: A | Discharge status: Home / Self Care | Payer: Medicare Other | Source: Ambulatory Visit | Attending: Family Medicine | Admitting: Family Medicine

## 2016-01-14 DIAGNOSIS — Z1231 Encounter for screening mammogram for malignant neoplasm of breast: Secondary | ICD-10-CM

## 2016-01-19 ENCOUNTER — Other Ambulatory Visit: Payer: Self-pay | Admitting: Family Medicine

## 2016-01-26 ENCOUNTER — Other Ambulatory Visit: Payer: Self-pay | Admitting: Family Medicine

## 2016-01-26 ENCOUNTER — Encounter: Payer: Self-pay | Admitting: *Deleted

## 2016-01-30 ENCOUNTER — Telehealth: Payer: Self-pay | Admitting: Family Medicine

## 2016-01-30 NOTE — Telephone Encounter (Signed)
Denied.

## 2016-02-02 ENCOUNTER — Ambulatory Visit (INDEPENDENT_AMBULATORY_CARE_PROVIDER_SITE_OTHER): Payer: Medicare Other

## 2016-02-02 DIAGNOSIS — Z23 Encounter for immunization: Secondary | ICD-10-CM

## 2016-02-09 ENCOUNTER — Other Ambulatory Visit: Payer: Self-pay | Admitting: Family Medicine

## 2016-04-17 ENCOUNTER — Other Ambulatory Visit: Payer: Self-pay | Admitting: Family Medicine

## 2016-04-20 DIAGNOSIS — H2513 Age-related nuclear cataract, bilateral: Secondary | ICD-10-CM | POA: Diagnosis not present

## 2016-04-20 DIAGNOSIS — H524 Presbyopia: Secondary | ICD-10-CM | POA: Diagnosis not present

## 2016-05-11 DIAGNOSIS — C73 Malignant neoplasm of thyroid gland: Secondary | ICD-10-CM | POA: Diagnosis not present

## 2016-05-11 DIAGNOSIS — E890 Postprocedural hypothyroidism: Secondary | ICD-10-CM | POA: Diagnosis not present

## 2016-05-18 ENCOUNTER — Ambulatory Visit (INDEPENDENT_AMBULATORY_CARE_PROVIDER_SITE_OTHER): Payer: Medicare Other | Admitting: Family Medicine

## 2016-05-18 ENCOUNTER — Encounter: Payer: Self-pay | Admitting: Family Medicine

## 2016-05-18 VITALS — BP 162/98 | HR 72 | Temp 97.4°F | Ht 65.0 in | Wt 169.0 lb

## 2016-05-18 DIAGNOSIS — E8881 Metabolic syndrome: Secondary | ICD-10-CM | POA: Diagnosis not present

## 2016-05-18 DIAGNOSIS — J42 Unspecified chronic bronchitis: Secondary | ICD-10-CM | POA: Diagnosis not present

## 2016-05-18 DIAGNOSIS — I10 Essential (primary) hypertension: Secondary | ICD-10-CM | POA: Diagnosis not present

## 2016-05-18 DIAGNOSIS — C73 Malignant neoplasm of thyroid gland: Secondary | ICD-10-CM

## 2016-05-18 DIAGNOSIS — E7800 Pure hypercholesterolemia, unspecified: Secondary | ICD-10-CM | POA: Diagnosis not present

## 2016-05-18 DIAGNOSIS — Z1283 Encounter for screening for malignant neoplasm of skin: Secondary | ICD-10-CM

## 2016-05-18 DIAGNOSIS — I2510 Atherosclerotic heart disease of native coronary artery without angina pectoris: Secondary | ICD-10-CM

## 2016-05-18 DIAGNOSIS — I2583 Coronary atherosclerosis due to lipid rich plaque: Secondary | ICD-10-CM

## 2016-05-18 DIAGNOSIS — F17200 Nicotine dependence, unspecified, uncomplicated: Secondary | ICD-10-CM | POA: Diagnosis not present

## 2016-05-18 DIAGNOSIS — E559 Vitamin D deficiency, unspecified: Secondary | ICD-10-CM | POA: Diagnosis not present

## 2016-05-18 MED ORDER — METOPROLOL TARTRATE 50 MG PO TABS: 25.0000 mg | ORAL_TABLET | Freq: Two times a day (BID) | ORAL | 3 refills | Status: DC

## 2016-05-18 MED ORDER — LOSARTAN POTASSIUM 100 MG PO TABS: ORAL_TABLET | ORAL | 3 refills | Status: DC

## 2016-05-18 MED ORDER — ROSUVASTATIN CALCIUM 40 MG PO TABS: ORAL_TABLET | ORAL | 3 refills | Status: DC

## 2016-05-18 MED ORDER — VITAMIN D (ERGOCALCIFEROL) 1.25 MG (50000 UNIT) PO CAPS: ORAL_CAPSULE | ORAL | 3 refills | Status: DC

## 2016-05-18 MED ORDER — FENOFIBRATE 160 MG PO TABS: ORAL_TABLET | ORAL | 3 refills | Status: DC

## 2016-05-18 NOTE — Patient Instructions (Addendum)
Medicare Annual Wellness Visit  [facility] and the medical providers at [facility] strive to bring you the best medical care.  In doing so we not only want to address your current medical conditions and concerns but also to detect new conditions early and prevent illness, disease and health-related problems.    Medicare offers a yearly Wellness Visit which allows our clinical staff to assess your need for preventative services including immunizations, lifestyle education, counseling to decrease risk of preventable diseases and screening for fall risk and other medical concerns.    This visit is provided free of charge (no copay) for all Medicare recipients. The clinical pharmacists at [facility] have begun to conduct these Wellness Visits which will also include a thorough review of all your medications.    As you primary medical provider recommend that you make an appointment for your Annual Wellness Visit if you have not done so already this year.  You may set up this appointment before you leave today or you may call back [phone] and schedule an appointment.  Please make sure when you call that you mention that you are scheduling your Annual Wellness Visit with the clinical pharmacist so that the appointment may be made for the proper length of time.     Continue current medications. Continue good therapeutic lifestyle changes which include good diet and exercise. Fall precautions discussed with patient. If an FOBT was given today- please return it to our front desk. If you are over 71 years old - you may need Prevnar 36 or the adult Pneumonia vaccine.  **Flu shots are available--- please call and schedule a FLU-CLINIC appointment**  After your visit with [place] today you will receive a survey in the mail or online from [employer] regarding your care with [place]. Please take a moment to fill this out. Your feedback is very  important to [place] as you can help [place] better understand your patient needs as well as improve your experience and satisfaction. WE CARE ABOUT YOU!!!   Continue to follow-up with cardiology and endocrinologist for thyroid cancer Continue to make all efforts at smoking cessation. For the rest of the winter practice good pulmonary hygiene and hand cleansing

## 2016-05-18 NOTE — Progress Notes (Signed)
Subjective:    Patient ID: Madison [name], female    DOB: 14-Jul-1945, 71 y.o.   MRN: [id number]  HPI Pt here for follow up and management of chronic medical problems which includes hyperlipidemia and hypertension. She is taking medication regularly.The patient today complains of some skin lesions on her left arm and face. She is requesting refills on all her medicines. She will get lab work done today. She will get her pelvic exam done also. Her weight today is down 3 pounds since the previous visit. The patient just saw her endocrinologist at [facility] and he is retiring but has reassigned her to a different endocrinologist. She only sees him yearly and this is because of her thyroid cancer from the past. He was please with all the blood work and send a copy this to be scanned into our record. She denies any chest pain or shortness of breath. She continues to smoke and will not stop. She's been encouraged by the cardiologist and our practice to stop but will not. She sees a cardiologist once yearly in August and she did see him this past August. She denies any trouble with her stomach including nausea vomiting diarrhea blood in the stool or change in bowel habits black tarry stools or abdominal pain. She is passing her water without problems. We will give her the name of a good dermatologist for a good total body screen in [place].   Patient Active Problem List   Diagnosis Date Noted  . Metabolic syndrome 87/86/7672  . Leukocytosis 01/04/2013  . TOBACCO ABUSE 10/29/2009  . THYROID CANCER 11/30/2007  . Hyperlipidemia 11/30/2007  . HTN (hypertension) 11/30/2007  . Coronary atherosclerosis 11/30/2007  . IRRITABLE BOWEL SYNDROME 11/30/2007  . Osteopenia 11/30/2007   Outpatient Encounter Prescriptions as of 05/18/2016  Medication Sig  . aspirin 81 MG tablet Take 1 tablet (81 mg total) by mouth daily.  . fenofibrate 160 MG tablet TAKE ONE (1) TABLET EACH DAY    . losartan (COZAAR) 100 MG tablet TAKE ONE (1) TABLET EACH DAY  . Melatonin 3 MG CAPS Take 3 mg by mouth at bedtime.  . metoprolol (LOPRESSOR) 50 MG tablet TAKE 1/2 TABLET TWICE DAILY  . nitroGLYCERIN (NITROSTAT) 0.4 MG SL tablet Place 1 tablet (0.4 mg total) under the tongue every 5 (five) minutes as needed for chest pain.  . rosuvastatin (CRESTOR) 40 MG tablet TAKE 1 TABLET AS DIRECTED  . SYNTHROID 137 MCG tablet Take 1 tablet (137 mcg total) by mouth daily before breakfast. TAKE 1 TABLET DAILY  . Vitamin D, Ergocalciferol, (DRISDOL) 50000 units CAPS capsule TAKE 1 CAPSULE EVERY WEEK  . [DISCONTINUED] amoxicillin-clavulanate (AUGMENTIN) 875-125 MG tablet Take 1 tablet by mouth 2 (two) times daily. Take all of this medication  . [DISCONTINUED] HYDROcodone-homatropine (HYCODAN) 5-1.5 MG/5ML syrup Take 5 mLs by mouth every 6 (six) hours as needed for cough.   Facility-Administered Encounter Medications as of 05/18/2016  Medication  . betamethasone acetate-betamethasone sodium phosphate (CELESTONE) injection 6 mg     Review of Systems  Constitutional: Negative.   HENT: Negative.   Eyes: Negative.   Respiratory: Negative.   Cardiovascular: Negative.   Gastrointestinal: Negative.   Endocrine: Negative.   Genitourinary: Negative.   Musculoskeletal: Negative.   Skin: Negative.        Skin lesion - face and left arm  Allergic/Immunologic: Negative.   Neurological: Negative.   Hematological: Negative.   Psychiatric/Behavioral: Negative.  Objective:   Physical Exam  Constitutional: She is oriented to person, place, and time. She appears well-developed and well-nourished. No distress.  HENT:  Head: Normocephalic and atraumatic.  Right Ear: External ear normal.  Left Ear: External ear normal.  Mouth/Throat: Oropharynx is clear and moist.  Nasal congestion bilaterally  Eyes: Conjunctivae and EOM are normal. Pupils are equal, round, and reactive to light. Right eye exhibits no  discharge. Left eye exhibits no discharge. No scleral icterus.  Neck: Normal range of motion. Neck supple. No thyromegaly present.  No bruits thyromegaly or anterior cervical adenopathy  Cardiovascular: Normal rate, regular rhythm, normal heart sounds and intact distal pulses.   No murmur heard. The heart has a regular rate and rhythm at 72/m  Pulmonary/Chest: Effort normal and breath sounds normal. No respiratory distress. She has no wheezes. She has no rales.  Slightly dry tight cough with no wheezes or rales  Abdominal: Soft. Bowel sounds are normal. She exhibits no mass. There is no tenderness. There is no rebound and no guarding.  No abdominal tenderness masses bruits or organ enlargement  Musculoskeletal: Normal range of motion. She exhibits no edema.  Lymphadenopathy:    She has no cervical adenopathy.  Neurological: She is alert and oriented to person, place, and time. She has normal reflexes. No cranial nerve deficit.  Skin: Skin is warm and dry. No rash noted.  Cryotherapy on 1 seborrheic care cases on left forearm. Another skin lesion noticed on right cheek and patient will get an appointment with dermatology for further skin checks and follow-up  Psychiatric: She has a normal mood and affect. Her behavior is normal. Judgment and thought content normal.  Nursing note and vitals reviewed.  BP (!) 165/77 (BP Location: Left Arm)   Pulse 72   Temp 97.4 F (36.3 C) (Oral)   Ht _0  (1.651 m)   Wt 169 lb (76.7 kg)   BMI 28.12 kg/m         Assessment & Plan:  1. Essential hypertension -The repeat blood pressure was elevated today at 166/98. The patient says her readings at home have been good and the readings at her endocrinologist office last week were also good. She will bring her monitor by the next 2-3 weeks to double check her monitor and its accuracy and we will recheck the blood pressure here at that time. - CBC with Differential/Platelet - BMP8+EGFR - Hepatic  function panel  2. Pure hypercholesterolemia -Continue with aggressive therapeutic lifestyle changes and current treatment- - CBC with Differential/Platelet - NMR, lipoprofile  3. Vitamin D deficiency -Continue with current treatment pending results of lab work - CBC with Differential/Platelet - VITAMIN D 25 Hydroxy (Vit-D Deficiency, Fractures)  4. Metabolic syndrome -Continue to work aggressively on weight loss and diet - CBC with Differential/Platelet  5. Thyroid cancer [facility]) -Follow-up with endocrinology as planned  6. TOBACCO ABUSE -Keep making all efforts at smoking cessation  7. Coronary artery disease due to lipid rich plaque -Follow-up with cardiology as planned  8. Chronic bronchitis, unspecified chronic bronchitis type ([place]) -Drink plenty of fluids stay well hydrated and practice good respiratory protection and stop smoking  9. Skin cancer screening - Ambulatory referral to Dermatology  Meds ordered this encounter  Medications  . Vitamin D, Ergocalciferol, (DRISDOL) 50000 units CAPS capsule    Sig: TAKE 1 CAPSULE EVERY WEEK    Dispense:  12 capsule    Refill:  3  . rosuvastatin (CRESTOR) 40 MG tablet  Sig: TAKE 1 TABLET AS DIRECTED    Dispense:  90 tablet    Refill:  3  . metoprolol (LOPRESSOR) 50 MG tablet    Sig: Take 0.5 tablets (25 mg total) by mouth 2 (two) times daily.    Dispense:  90 tablet    Refill:  3  . losartan (COZAAR) 100 MG tablet    Sig: TAKE ONE (1) TABLET EACH DAY    Dispense:  90 tablet    Refill:  3  . fenofibrate 160 MG tablet    Sig: TAKE ONE (1) TABLET EACH DAY    Dispense:  90 tablet    Refill:  3   Patient Instructions                       Medicare Annual Wellness Visit  [facility] and the medical providers at [facility] strive to bring you the best medical care.  In doing so we not only want to address your current medical conditions and concerns but also to detect new conditions early and  prevent illness, disease and health-related problems.    Medicare offers a yearly Wellness Visit which allows our clinical staff to assess your need for preventative services including immunizations, lifestyle education, counseling to decrease risk of preventable diseases and screening for fall risk and other medical concerns.    This visit is provided free of charge (no copay) for all Medicare recipients. The clinical pharmacists at [facility] have begun to conduct these Wellness Visits which will also include a thorough review of all your medications.    As you primary medical provider recommend that you make an appointment for your Annual Wellness Visit if you have not done so already this year.  You may set up this appointment before you leave today or you may call back [phone]) and schedule an appointment.  Please make sure when you call that you mention that you are scheduling your Annual Wellness Visit with the clinical pharmacist so that the appointment may be made for the proper length of time.     Continue current medications. Continue good therapeutic lifestyle changes which include good diet and exercise. Fall precautions discussed with patient. If an FOBT was given today- please return it to our front desk. If you are over 53 years old - you may need Prevnar 30 or the adult Pneumonia vaccine.  **Flu shots are available--- please call and schedule a FLU-CLINIC appointment**  After your visit with [place] today you will receive a survey in the mail or online from [employer] regarding your care with [place]. Please take a moment to fill this out. Your feedback is very important to [place] as you can help [place] better understand your patient needs as well as improve your experience and satisfaction. WE CARE ABOUT YOU!!!   Continue to follow-up with cardiology and endocrinologist for thyroid cancer Continue to make all efforts at smoking cessation. For the rest of the winter  practice good pulmonary hygiene and hand cleansing    Arrie Senate MD

## 2016-05-19 LAB — NMR, LIPOPROFILE
Cholesterol: 123 mg/dL (ref 100–199)
HDL Cholesterol by NMR: 22 mg/dL — ABNORMAL LOW (ref >39)
HDL Particle Number: 20.3 umol/L — ABNORMAL LOW (ref >=30.5)
LDL Particle Number: 1211 nmol/L — ABNORMAL HIGH (ref <1000)
LDL Size: 20.9 nm (ref >20.5)
LDL-C: 72 mg/dL (ref 0–99)
LP-IR Score: 77 — ABNORMAL HIGH (ref <=45)
Small LDL Particle Number: 632 nmol/L — ABNORMAL HIGH (ref <=527)
Triglycerides by NMR: 147 mg/dL (ref 0–149)

## 2016-05-19 LAB — CBC WITH DIFFERENTIAL/PLATELET
Basophils Absolute: 0 10*3/uL (ref 0.0–0.2)
Basos: 0 %
EOS (ABSOLUTE): 0.3 10*3/uL (ref 0.0–0.4)
Eos: 3 %
Hematocrit: 46.2 % (ref 34.0–46.6)
Hemoglobin: 15.5 g/dL (ref 11.1–15.9)
Immature Grans (Abs): 0 10*3/uL (ref 0.0–0.1)
Immature Granulocytes: 0 %
Lymphocytes Absolute: 3.4 10*3/uL — ABNORMAL HIGH (ref 0.7–3.1)
Lymphs: 32 %
MCH: 29.9 pg (ref 26.6–33.0)
MCHC: 33.5 g/dL (ref 31.5–35.7)
MCV: 89 fL (ref 79–97)
Monocytes Absolute: 0.6 10*3/uL (ref 0.1–0.9)
Monocytes: 6 %
Neutrophils Absolute: 6.1 10*3/uL (ref 1.4–7.0)
Neutrophils: 59 %
Platelets: 273 10*3/uL (ref 150–379)
RBC: 5.19 x10E6/uL (ref 3.77–5.28)
RDW: 13.4 % (ref 12.3–15.4)
WBC: 10.5 10*3/uL (ref 3.4–10.8)

## 2016-05-19 LAB — VITAMIN D 25 HYDROXY (VIT D DEFICIENCY, FRACTURES): Vit D, 25-Hydroxy: 53.8 ng/mL (ref 30.0–100.0)

## 2016-05-19 LAB — BMP8+EGFR
BUN/Creatinine Ratio: 19 (ref 12–28)
BUN: 20 mg/dL (ref 8–27)
CO2: 21 mmol/L (ref 18–29)
Calcium: 9.5 mg/dL (ref 8.7–10.3)
Chloride: 101 mmol/L (ref 96–106)
Creatinine, Ser: 1.03 mg/dL — ABNORMAL HIGH (ref 0.57–1.00)
GFR calc Af Amer: 64 mL/min/{1.73_m2} (ref >59)
GFR calc non Af Amer: 56 mL/min/{1.73_m2} — ABNORMAL LOW (ref >59)
Glucose: 96 mg/dL (ref 65–99)
Potassium: 4.6 mmol/L (ref 3.5–5.2)
Sodium: 139 mmol/L (ref 134–144)

## 2016-05-19 LAB — HEPATIC FUNCTION PANEL
ALT: 22 IU/L (ref 0–32)
AST: 21 IU/L (ref 0–40)
Albumin: 4.6 g/dL (ref 3.6–4.8)
Alkaline Phosphatase: 53 IU/L (ref 39–117)
Bilirubin Total: 0.3 mg/dL (ref 0.0–1.2)
Bilirubin, Direct: 0.11 mg/dL (ref 0.00–0.40)
Total Protein: 7.3 g/dL (ref 6.0–8.5)

## 2016-05-31 ENCOUNTER — Ambulatory Visit: Payer: Medicare Other | Admitting: Family Medicine

## 2016-06-07 ENCOUNTER — Ambulatory Visit (INDEPENDENT_AMBULATORY_CARE_PROVIDER_SITE_OTHER): Payer: Medicare Other | Admitting: *Deleted

## 2016-06-07 VITALS — BP 159/97 | HR 84

## 2016-06-07 DIAGNOSIS — Z0130 Encounter for examination of blood pressure without abnormal findings: Secondary | ICD-10-CM

## 2016-06-07 NOTE — Progress Notes (Signed)
Pt here for BP ck Checked BP on our machine vs her machine Numbers are comparable

## 2016-06-24 DIAGNOSIS — D2371 Other benign neoplasm of skin of right lower limb, including hip: Secondary | ICD-10-CM | POA: Diagnosis not present

## 2016-06-24 DIAGNOSIS — L821 Other seborrheic keratosis: Secondary | ICD-10-CM | POA: Diagnosis not present

## 2016-06-24 DIAGNOSIS — L918 Other hypertrophic disorders of the skin: Secondary | ICD-10-CM | POA: Insufficient documentation

## 2016-06-24 DIAGNOSIS — D1801 Hemangioma of skin and subcutaneous tissue: Secondary | ICD-10-CM | POA: Diagnosis not present

## 2016-06-24 DIAGNOSIS — L570 Actinic keratosis: Secondary | ICD-10-CM | POA: Insufficient documentation

## 2016-06-24 DIAGNOSIS — D225 Melanocytic nevi of trunk: Secondary | ICD-10-CM | POA: Diagnosis not present

## 2016-06-24 DIAGNOSIS — D239 Other benign neoplasm of skin, unspecified: Secondary | ICD-10-CM | POA: Insufficient documentation

## 2016-10-15 ENCOUNTER — Encounter: Payer: Self-pay | Admitting: *Deleted

## 2016-11-12 ENCOUNTER — Ambulatory Visit: Payer: Medicare Other | Admitting: Family Medicine

## 2016-11-25 ENCOUNTER — Ambulatory Visit (INDEPENDENT_AMBULATORY_CARE_PROVIDER_SITE_OTHER): Payer: Medicare Other | Admitting: Family Medicine

## 2016-11-25 ENCOUNTER — Ambulatory Visit (INDEPENDENT_AMBULATORY_CARE_PROVIDER_SITE_OTHER): Payer: Medicare Other

## 2016-11-25 ENCOUNTER — Encounter: Payer: Self-pay | Admitting: Family Medicine

## 2016-11-25 VITALS — BP 168/88 | HR 72 | Temp 98.3°F | Ht 65.0 in | Wt 168.0 lb

## 2016-11-25 DIAGNOSIS — Z780 Asymptomatic menopausal state: Secondary | ICD-10-CM

## 2016-11-25 DIAGNOSIS — C73 Malignant neoplasm of thyroid gland: Secondary | ICD-10-CM | POA: Diagnosis not present

## 2016-11-25 DIAGNOSIS — E559 Vitamin D deficiency, unspecified: Secondary | ICD-10-CM

## 2016-11-25 DIAGNOSIS — I10 Essential (primary) hypertension: Secondary | ICD-10-CM

## 2016-11-25 DIAGNOSIS — Z13820 Encounter for screening for osteoporosis: Secondary | ICD-10-CM

## 2016-11-25 DIAGNOSIS — E8881 Metabolic syndrome: Secondary | ICD-10-CM | POA: Diagnosis not present

## 2016-11-25 DIAGNOSIS — E7800 Pure hypercholesterolemia, unspecified: Secondary | ICD-10-CM

## 2016-11-25 DIAGNOSIS — F17200 Nicotine dependence, unspecified, uncomplicated: Secondary | ICD-10-CM

## 2016-11-25 NOTE — Patient Instructions (Addendum)
Medicare Annual Wellness Visit  [facility] and the medical providers at [facility] strive to bring you the best medical care.  In doing so we not only want to address your current medical conditions and concerns but also to detect new conditions early and prevent illness, disease and health-related problems.    Medicare offers a yearly Wellness Visit which allows our clinical staff to assess your need for preventative services including immunizations, lifestyle education, counseling to decrease risk of preventable diseases and screening for fall risk and other medical concerns.    This visit is provided free of charge (no copay) for all Medicare recipients. The clinical pharmacists at [facility] have begun to conduct these Wellness Visits which will also include a thorough review of all your medications.    As you primary medical provider recommend that you make an appointment for your Annual Wellness Visit if you have not done so already this year.  You may set up this appointment before you leave today or you may call back [phone]) and schedule an appointment.  Please make sure when you call that you mention that you are scheduling your Annual Wellness Visit with the clinical pharmacist so that the appointment may be made for the proper length of time.      Continue current medications. Continue good therapeutic lifestyle changes which include good diet and exercise. Fall precautions discussed with patient. If an FOBT was given today- please return it to our front desk. If you are over 70 years old - you may need Prevnar 14 or the adult Pneumonia vaccine.  **Flu shots are available--- please call and schedule a FLU-CLINIC appointment**  After your visit with [place] today you will receive a survey in the mail or online from [employer] regarding your care with [place]. Please take a moment to fill this out. Your feedback is very  important to [place] as you can help [place] better understand your patient needs as well as improve your experience and satisfaction. WE CARE ABOUT YOU!!!   Try getting some 0-calorie yogurt and eating this at least 3 times weekly along with taking align generic brand the equate brand 1 daily and do this for about 3 months to see if this can make any difference with your irritable bowel syndrome. Drink plenty of water and eat less sugar

## 2016-11-25 NOTE — Progress Notes (Signed)
Subjective:    Patient ID: Madison [name], female    DOB: 1946/03/05, 71 y.o.   MRN: [id number]  HPI Pt here for follow up and management of chronic medical problems which includes hyperlipidemia and hypertension. She is taking medication regularly. The patient is doing well today with no complaints. She is not needing refills on any of the medicines today. She is scheduling a pelvic exam with one of the providers here. She had her mammogram in September of last year we need to get another one in the September. She is going to get a DEXA scan today and will be given an FOBT to return. She has had lab work done this morning. The significant history for this patient is that she has had a heart attack and thyroid cancer. She is followed regularly by the cardiologist and the endocrinologist in [place]. Cardiologist is here in [place]. She does continue to smoke and all efforts to get her to stop have been met with failure. The patient denies any chest pain or shortness of breath. She's not been very active this summer. She has irritable bowel syndrome and has days with frequent stools and other days with constipation. She denies any black tarry bowel movements or trouble swallowing. She does occasionally have some bright red blood in the stool or on the toilet tissue from hemorrhoids. She is passing her water without problems. She sees a note by endocrinologist on a yearly basis and January for her thyroid cancer follow-up.    Patient Active Problem List   Diagnosis Date Noted  . Metabolic syndrome 17/71/1657  . Leukocytosis 01/04/2013  . TOBACCO ABUSE 10/29/2009  . THYROID CANCER 11/30/2007  . Hyperlipidemia 11/30/2007  . HTN (hypertension) 11/30/2007  . Coronary atherosclerosis 11/30/2007  . IRRITABLE BOWEL SYNDROME 11/30/2007  . Osteopenia 11/30/2007   Outpatient Encounter Prescriptions as of 11/25/2016  Medication Sig  . aspirin 81 MG tablet Take 1 tablet (81 mg total) by mouth daily.    . fenofibrate 160 MG tablet TAKE ONE (1) TABLET EACH DAY  . losartan (COZAAR) 100 MG tablet TAKE ONE (1) TABLET EACH DAY  . Melatonin 3 MG CAPS Take 3 mg by mouth at bedtime.  . metoprolol (LOPRESSOR) 50 MG tablet Take 0.5 tablets (25 mg total) by mouth 2 (two) times daily.  . nitroGLYCERIN (NITROSTAT) 0.4 MG SL tablet Place 1 tablet (0.4 mg total) under the tongue every 5 (five) minutes as needed for chest pain.  . rosuvastatin (CRESTOR) 40 MG tablet TAKE 1 TABLET AS DIRECTED  . SYNTHROID 137 MCG tablet Take 1 tablet (137 mcg total) by mouth daily before breakfast. TAKE 1 TABLET DAILY  . Vitamin D, Ergocalciferol, (DRISDOL) 50000 units CAPS capsule TAKE 1 CAPSULE EVERY WEEK   Facility-Administered Encounter Medications as of 11/25/2016  Medication  . betamethasone acetate-betamethasone sodium phosphate (CELESTONE) injection 6 mg      Review of Systems  Constitutional: Negative.   HENT: Negative.   Eyes: Negative.   Respiratory: Negative.   Cardiovascular: Negative.   Gastrointestinal: Negative.   Endocrine: Negative.   Genitourinary: Negative.   Musculoskeletal: Negative.   Skin: Negative.   Allergic/Immunologic: Negative.   Neurological: Negative.   Hematological: Negative.   Psychiatric/Behavioral: Negative.        Objective:   Physical Exam  Constitutional: She is oriented to person, place, and time. She appears well-developed and well-nourished. No distress.  The patient is pleasant and alert and continues to smoke  HENT:  Head: Normocephalic and  atraumatic.  Right Ear: External ear normal.  Left Ear: External ear normal.  Nose: Nose normal.  Mouth/Throat: Oropharynx is clear and moist. No oropharyngeal exudate.  Eyes: Pupils are equal, round, and reactive to light. Conjunctivae and EOM are normal. Right eye exhibits no discharge. Left eye exhibits no discharge. No scleral icterus.  Neck: Normal range of motion. Neck supple. No thyromegaly present.  No bruits  thyromegaly or anterior cervical adenopathy  Cardiovascular: Normal rate, regular rhythm, normal heart sounds and intact distal pulses.   No murmur heard. The heart is regular at 72/m  Pulmonary/Chest: Effort normal and breath sounds normal. No respiratory distress. She has no wheezes. She has no rales.  Clear anteriorly and posteriorly  Abdominal: Soft. Bowel sounds are normal. She exhibits no mass. There is no tenderness. There is no rebound and no guarding.  Abdominal obesity with a BMI of 28.2. No organ enlargement masses or tenderness  Musculoskeletal: Normal range of motion. She exhibits no edema.  Lymphadenopathy:    She has no cervical adenopathy.  Neurological: She is alert and oriented to person, place, and time. She has normal reflexes. No cranial nerve deficit.  Skin: Skin is warm and dry. No rash noted.  Dry skin in general  Psychiatric: She has a normal mood and affect. Her behavior is normal. Judgment and thought content normal.  Nursing note and vitals reviewed.   BP (!) 163/95 (BP Location: Left Arm)   Pulse 72   Temp 98.3 F (36.8 C) (Oral)   Ht 5' 5"  (1.651 m)   Wt 168 lb (76.2 kg)   BMI 27.96 kg/m        Assessment & Plan:  1. Pure hypercholesterolemia -Continue current treatment and aggressive therapeutic lifestyle changes pending results of lab work - CBC with Differential/Platelet - Lipid panel  2. Essential hypertension -The blood pressure was high on 2 occasions today in this office. His high when she goes to the doctor's office. She says her blood pressures at home are always in the 120s and always less than the upper 65H for the diastolic. She has brought her blood pressure cuff in to this office and checked it here with her blood pressure cuff and it is high here but normal at home. We will continue to encourage her to check the blood pressures regularly and bring readings to each visit or when she goes to see the cardiologist to take him to his  visit. - BMP8+EGFR - CBC with Differential/Platelet - Hepatic function panel  3. Vitamin D deficiency -Continue vitamin D replacement pending results of lab work - CBC with Differential/Platelet - VITAMIN D 25 Hydroxy (Vit-D Deficiency, Fractures) - DG [facility] DEXA; Future  4. Metabolic syndrome -Continue to make all efforts to lose weight through diet and exercise - CBC with Differential/Platelet  5. Thyroid cancer ([place]) -Continue to follow-up with the endocrinologist at [facility] on a yearly basis - CBC with Differential/Platelet  6. Postmenopausal - DG [facility] DEXA; Future  7. Screening for osteoporosis - DG [facility] DEXA; Future  8. TOBACCO ABUSE -Continue to make all efforts to stop smoking because of his association with heart disease and osteoporosis and cancer.  Patient Instructions                       Medicare Annual Wellness Visit  [facility] and the medical providers at [facility] strive to bring you the best medical care.  In doing so we not only  want to address your current medical conditions and concerns but also to detect new conditions early and prevent illness, disease and health-related problems.    Medicare offers a yearly Wellness Visit which allows our clinical staff to assess your need for preventative services including immunizations, lifestyle education, counseling to decrease risk of preventable diseases and screening for fall risk and other medical concerns.    This visit is provided free of charge (no copay) for all Medicare recipients. The clinical pharmacists at [facility] have begun to conduct these Wellness Visits which will also include a thorough review of all your medications.    As you primary medical provider recommend that you make an appointment for your Annual Wellness Visit if you have not done so already this year.  You may set up this appointment before you leave today or you may call back ([phone])  and schedule an appointment.  Please make sure when you call that you mention that you are scheduling your Annual Wellness Visit with the clinical pharmacist so that the appointment may be made for the proper length of time.      Continue current medications. Continue good therapeutic lifestyle changes which include good diet and exercise. Fall precautions discussed with patient. If an FOBT was given today- please return it to our front desk. If you are over 64 years old - you may need Prevnar 49 or the adult Pneumonia vaccine.  **Flu shots are available--- please call and schedule a FLU-CLINIC appointment**  After your visit with [place] today you will receive a survey in the mail or online from [employer] regarding your care with [place]. Please take a moment to fill this out. Your feedback is very important to [place] as you can help [place] better understand your patient needs as well as improve your experience and satisfaction. WE CARE ABOUT YOU!!!   Try getting some 0-calorie yogurt and eating this at least 3 times weekly along with taking align generic brand the equate brand 1 daily and do this for about 3 months to see if this can make any difference with your irritable bowel syndrome. Drink plenty of water and eat less sugar    Arrie Senate MD

## 2016-11-26 LAB — CBC WITH DIFFERENTIAL/PLATELET
Basophils Absolute: 0.1 10*3/uL (ref 0.0–0.2)
Basos: 1 %
EOS (ABSOLUTE): 0.5 10*3/uL — ABNORMAL HIGH (ref 0.0–0.4)
Eos: 4 %
Hematocrit: 48.9 % — ABNORMAL HIGH (ref 34.0–46.6)
Hemoglobin: 16.7 g/dL — ABNORMAL HIGH (ref 11.1–15.9)
Immature Grans (Abs): 0 10*3/uL (ref 0.0–0.1)
Immature Granulocytes: 0 %
Lymphocytes Absolute: 3.7 10*3/uL — ABNORMAL HIGH (ref 0.7–3.1)
Lymphs: 33 %
MCH: 31.2 pg (ref 26.6–33.0)
MCHC: 34.2 g/dL (ref 31.5–35.7)
MCV: 91 fL (ref 79–97)
Monocytes Absolute: 0.6 10*3/uL (ref 0.1–0.9)
Monocytes: 6 %
Neutrophils Absolute: 6.5 10*3/uL (ref 1.4–7.0)
Neutrophils: 56 %
Platelets: 268 10*3/uL (ref 150–379)
RBC: 5.36 x10E6/uL — ABNORMAL HIGH (ref 3.77–5.28)
RDW: 13.8 % (ref 12.3–15.4)
WBC: 11.5 10*3/uL — ABNORMAL HIGH (ref 3.4–10.8)

## 2016-11-26 LAB — LIPID PANEL
Chol/HDL Ratio: 6 ratio — ABNORMAL HIGH (ref 0.0–4.4)
Cholesterol, Total: 126 mg/dL (ref 100–199)
HDL: 21 mg/dL — ABNORMAL LOW (ref >39)
LDL Calculated: 73 mg/dL (ref 0–99)
Triglycerides: 161 mg/dL — ABNORMAL HIGH (ref 0–149)
VLDL Cholesterol Cal: 32 mg/dL (ref 5–40)

## 2016-11-26 LAB — BMP8+EGFR
BUN/Creatinine Ratio: 20 (ref 12–28)
BUN: 21 mg/dL (ref 8–27)
CO2: 20 mmol/L (ref 20–29)
Calcium: 9.5 mg/dL (ref 8.7–10.3)
Chloride: 101 mmol/L (ref 96–106)
Creatinine, Ser: 1.03 mg/dL — ABNORMAL HIGH (ref 0.57–1.00)
GFR calc Af Amer: 64 mL/min/{1.73_m2} (ref >59)
GFR calc non Af Amer: 55 mL/min/{1.73_m2} — ABNORMAL LOW (ref >59)
Glucose: 114 mg/dL — ABNORMAL HIGH (ref 65–99)
Potassium: 4.8 mmol/L (ref 3.5–5.2)
Sodium: 140 mmol/L (ref 134–144)

## 2016-11-26 LAB — HEPATIC FUNCTION PANEL
ALT: 21 IU/L (ref 0–32)
AST: 20 IU/L (ref 0–40)
Albumin: 4.7 g/dL (ref 3.5–4.8)
Alkaline Phosphatase: 58 IU/L (ref 39–117)
Bilirubin Total: <0.2 mg/dL (ref 0.0–1.2)
Bilirubin, Direct: 0.05 mg/dL (ref 0.00–0.40)
Total Protein: 7.5 g/dL (ref 6.0–8.5)

## 2016-11-26 LAB — VITAMIN D 25 HYDROXY (VIT D DEFICIENCY, FRACTURES): Vit D, 25-Hydroxy: 45 ng/mL (ref 30.0–100.0)

## 2016-12-10 ENCOUNTER — Encounter: Payer: Self-pay | Admitting: Family

## 2016-12-10 ENCOUNTER — Ambulatory Visit (INDEPENDENT_AMBULATORY_CARE_PROVIDER_SITE_OTHER): Payer: Medicare Other | Admitting: Family

## 2016-12-10 VITALS — BP 154/90 | HR 67 | Temp 97.3°F | Ht 65.0 in | Wt 171.0 lb

## 2016-12-10 DIAGNOSIS — I10 Essential (primary) hypertension: Secondary | ICD-10-CM | POA: Diagnosis not present

## 2016-12-10 DIAGNOSIS — F17200 Nicotine dependence, unspecified, uncomplicated: Secondary | ICD-10-CM | POA: Diagnosis not present

## 2016-12-10 DIAGNOSIS — Z01419 Encounter for gynecological examination (general) (routine) without abnormal findings: Secondary | ICD-10-CM

## 2016-12-10 NOTE — Progress Notes (Signed)
   Subjective:    Patient ID: Madison [name], female    DOB: December 17, 1945, 71 y.o.   MRN: [id number]  PT presents to the office today for pelvic exam. Pt denies any abnormal pap's over the last 5-10 years. Pt does have hx of thyroid cancer, but is followed by Endocrinologists annually.  Gynecologic Exam  The patient's pertinent negatives include no genital itching, genital odor, vaginal bleeding or vaginal discharge. The patient is experiencing no pain. Pertinent negatives include no headaches.  Hypertension  This is a chronic problem. The current episode started more than 1 year ago. The problem is uncontrolled. Pertinent negatives include no headaches, peripheral edema or shortness of breath. Past treatments include beta blockers and angiotensin blockers. The current treatment provides mild improvement. There is no history of CVA or heart failure.      Review of Systems  Respiratory: Negative for shortness of breath.   Genitourinary: Negative for vaginal discharge.  Neurological: Negative for headaches.  All other systems reviewed and are negative.      Objective:   Physical Exam  Constitutional: She is oriented to person, place, and time. She appears well-developed and well-nourished. No distress.  HENT:  Head: Normocephalic and atraumatic.  Right Ear: External ear normal.  Left Ear: External ear normal.  Nose: Nose normal.  Mouth/Throat: Oropharynx is clear and moist.  Eyes: Pupils are equal, round, and reactive to light.  Neck: Normal range of motion. Neck supple. No thyromegaly present.  Cardiovascular: Normal rate, regular rhythm, normal heart sounds and intact distal pulses.   No murmur heard. Pulmonary/Chest: Effort normal and breath sounds normal. No respiratory distress. She has no wheezes. Right breast exhibits no inverted nipple, no mass, no nipple discharge, no skin change and no tenderness. Left breast exhibits no inverted nipple, no mass, no nipple discharge, no skin  change and no tenderness. Breasts are symmetrical.  Abdominal: Soft. Bowel sounds are normal. She exhibits no distension. There is no tenderness.  Genitourinary: Vagina normal.  Genitourinary Comments: Bimanual exam- no adnexal masses or tenderness, ovaries nonpalpable   Cervix parous and pink- No discharge   Musculoskeletal: Normal range of motion. She exhibits no edema or tenderness.  Neurological: She is alert and oriented to person, place, and time.  Skin: Skin is warm and dry.  Psychiatric: She has a normal mood and affect. Her behavior is normal. Judgment and thought content normal.  Vitals reviewed.    BP (!) 154/90   Pulse 67   Temp (!) 97.3 F (36.3 C) (Oral)   Ht 5\' 5"  (1.651 m)   Wt 171 lb (77.6 kg)   BMI 28.46 kg/m      Assessment & Plan:  1. Essential hypertension  2. TOBACCO ABUSE Smoking cessation discussed  3. Gynecologic exam normal -Discussed this will be her last pap - Pap IG (Image Guided) Keep follow up with PCP  Encourage healthy diet and exercise  Evelina Dun, FNP

## 2016-12-10 NOTE — Patient Instructions (Signed)

## 2016-12-13 LAB — PAP IG (IMAGE GUIDED): PAP Smear Comment: 0

## 2016-12-17 ENCOUNTER — Other Ambulatory Visit: Payer: Self-pay | Admitting: Family Medicine

## 2016-12-17 DIAGNOSIS — Z1231 Encounter for screening mammogram for malignant neoplasm of breast: Secondary | ICD-10-CM

## 2017-01-17 ENCOUNTER — Ambulatory Visit
Admission: RE | Admit: 2017-01-17 | Discharge: 2017-01-17 | Disposition: A | Discharge status: Home / Self Care | Payer: Medicare Other | Source: Ambulatory Visit | Attending: Family Medicine | Admitting: Family Medicine

## 2017-01-17 DIAGNOSIS — Z1231 Encounter for screening mammogram for malignant neoplasm of breast: Secondary | ICD-10-CM | POA: Diagnosis not present

## 2017-01-19 DIAGNOSIS — H43811 Vitreous degeneration, right eye: Secondary | ICD-10-CM | POA: Diagnosis not present

## 2017-01-25 ENCOUNTER — Ambulatory Visit (INDEPENDENT_AMBULATORY_CARE_PROVIDER_SITE_OTHER): Payer: Medicare Other

## 2017-01-25 DIAGNOSIS — Z23 Encounter for immunization: Secondary | ICD-10-CM | POA: Diagnosis not present

## 2017-02-23 ENCOUNTER — Ambulatory Visit: Payer: Medicare Other | Admitting: Cardiology

## 2017-02-23 ENCOUNTER — Ambulatory Visit (INDEPENDENT_AMBULATORY_CARE_PROVIDER_SITE_OTHER): Payer: Medicare Other

## 2017-02-23 ENCOUNTER — Encounter: Payer: Self-pay | Admitting: Family Medicine

## 2017-02-23 ENCOUNTER — Encounter: Payer: Self-pay | Admitting: Cardiology

## 2017-02-23 ENCOUNTER — Ambulatory Visit (INDEPENDENT_AMBULATORY_CARE_PROVIDER_SITE_OTHER): Payer: Medicare Other | Admitting: Family Medicine

## 2017-02-23 VITALS — BP 156/90 | HR 85 | Temp 97.7°F | Ht 65.0 in | Wt 161.0 lb

## 2017-02-23 VITALS — BP 152/98 | HR 88 | Ht 65.0 in | Wt 160.0 lb

## 2017-02-23 DIAGNOSIS — F419 Anxiety disorder, unspecified: Secondary | ICD-10-CM | POA: Diagnosis not present

## 2017-02-23 DIAGNOSIS — I252 Old myocardial infarction: Secondary | ICD-10-CM

## 2017-02-23 DIAGNOSIS — F17200 Nicotine dependence, unspecified, uncomplicated: Secondary | ICD-10-CM

## 2017-02-23 DIAGNOSIS — M546 Pain in thoracic spine: Secondary | ICD-10-CM

## 2017-02-23 DIAGNOSIS — I10 Essential (primary) hypertension: Secondary | ICD-10-CM | POA: Diagnosis not present

## 2017-02-23 DIAGNOSIS — I4510 Unspecified right bundle-branch block: Secondary | ICD-10-CM | POA: Insufficient documentation

## 2017-02-23 DIAGNOSIS — R079 Chest pain, unspecified: Secondary | ICD-10-CM | POA: Insufficient documentation

## 2017-02-23 DIAGNOSIS — E785 Hyperlipidemia, unspecified: Secondary | ICD-10-CM

## 2017-02-23 DIAGNOSIS — I2583 Coronary atherosclerosis due to lipid rich plaque: Secondary | ICD-10-CM | POA: Diagnosis not present

## 2017-02-23 DIAGNOSIS — R0789 Other chest pain: Secondary | ICD-10-CM | POA: Diagnosis not present

## 2017-02-23 DIAGNOSIS — C73 Malignant neoplasm of thyroid gland: Secondary | ICD-10-CM | POA: Diagnosis not present

## 2017-02-23 DIAGNOSIS — I2510 Atherosclerotic heart disease of native coronary artery without angina pectoris: Secondary | ICD-10-CM | POA: Diagnosis not present

## 2017-02-23 MED ORDER — NITROGLYCERIN 0.4 MG SL SUBL: 0.4000 mg | SUBLINGUAL_TABLET | SUBLINGUAL | 3 refills | Status: DC | PRN

## 2017-02-23 MED ORDER — METOPROLOL TARTRATE 50 MG PO TABS: 50.0000 mg | ORAL_TABLET | Freq: Two times a day (BID) | ORAL | 3 refills | Status: DC

## 2017-02-23 MED ORDER — BUSPIRONE HCL 15 MG PO TABS: 15.0000 mg | ORAL_TABLET | Freq: Two times a day (BID) | ORAL | 1 refills | Status: DC

## 2017-02-23 NOTE — Progress Notes (Addendum)
HPI The patient returns for followup of CAD.  She was added to my schedule because she has back pain similar to previous angina.  She describes back pain.  This comes on sporadically.  However, as opposed to her previous anginal pain she is not having any chest or arm pain.  She is not having nausea which she had previously.  The discomfort is slight pain that is behind her left scapula.  She cannot bring it on with activities.  She is done some raking without bringing it on.  It might last for an hour at a time.  She does not describe radiation to her jaw.  She is not having any new shortness of breath, PND or orthopnea.  She is having lots of anxiety.  Of note she does have a new right bundle branch block on when she saw Dr. Laurance Flatten.  Allergies  Allergen Reactions  . Banana     Throat and ears itch,ears feel like they swell  . Bupropion Hcl     REACTION: paranoia  . Chantix [Varenicline]     paranoia  . Latex Rash  . Zetia [Ezetimibe]     Myalgia    Current Outpatient Medications  Medication Sig Dispense Refill  . aspirin 81 MG tablet Take 1 tablet (81 mg total) by mouth daily.    . fenofibrate 160 MG tablet Take 160 mg daily by mouth.    . losartan (COZAAR) 100 MG tablet Take 100 mg daily by mouth.    . Melatonin 3 MG CAPS Take 3 mg by mouth at bedtime.    . metoprolol tartrate (LOPRESSOR) 50 MG tablet Take 1 tablet (50 mg total) 2 (two) times daily by mouth. 180 tablet 3  . nitroGLYCERIN (NITROSTAT) 0.4 MG SL tablet Place 1 tablet (0.4 mg total) every 5 (five) minutes as needed under the tongue for chest pain. 25 tablet 3  . rosuvastatin (CRESTOR) 40 MG tablet Take 40 mg daily by mouth.    . SYNTHROID 137 MCG tablet Take 1 tablet (137 mcg total) by mouth daily before breakfast. TAKE 1 TABLET DAILY 34 tablet 6  . Vitamin D, Ergocalciferol, (DRISDOL) 50000 units CAPS capsule TAKE 1 CAPSULE EVERY WEEK 12 capsule 3   No current facility-administered medications for this visit.      Past Medical History:  Diagnosis Date  . CAD (coronary artery disease)    PCI of an occluded right coronary artery 2001. 70% LAD stenosis, 30% circumflex stenosis.  . Diverticulosis   . Hyperlipidemia   . Hypertension   . IBS (irritable bowel syndrome)   . Leukocytosis, unspecified 07/24/2012  . Myocardial infarction ([place]) 2001  . Need for prophylactic hormone replacement therapy (postmenopausal)   . Osteopenia   . Osteoporosis   . Thyroid cancer [facility]) 2004   Dr. Clovia Cuff   . Thyroid nodule 2004  . Tobacco abuse     Past Surgical History:  Procedure Laterality Date  . APPENDECTOMY  1955  . BACK SURGERY  06/2000  . CHOLECYSTECTOMY  07/2002  . LIPOMA EXCISION Right 08/2003   r arm   . THYROIDECTOMY Bilateral 02/2003    ROS: Positive for anxiety, decreased appetite, weight loss, insomnia. Otherwise as stated in the HPI and negative for all other systems.  PHYSICAL EXAM BP (!) 152/98   Pulse 88   Ht 5\' 5"  (1.651 m)   Wt 160 lb (72.6 kg)   BMI 26.63 kg/m   GENERAL:  Well appearing NECK:  No  jugular venous distention, waveform within normal limits, carotid upstroke brisk and symmetric, no bruits, no thyromegaly LUNGS:  Clear to auscultation bilaterally CHEST:  Unremarkable HEART:  PMI not displaced or sustained,S1 and S2 within normal limits, no S3, no S4, no clicks, no rubs, no murmurs ABD:  Flat, positive bowel sounds normal in frequency in pitch, no bruits, no rebound, no guarding, no midline pulsatile mass, no hepatomegaly, no splenomegaly EXT:  2 plus pulses throughout, no edema, no cyanosis no clubbing   EKG:  Ectopic atrial rhythm, rate 81 , RBBB, no acute ST-T wave changes.  02/23/2017  Lab Results  Component Value Date   CHOL 126 11/25/2016   TRIG 161 (H) 11/25/2016   HDL 21 (L) 11/25/2016   LDLCALC 73 11/25/2016    ASSESSMENT AND PLAN  CORONARY ARTERY DISEASE -  The patient has some atypical symptoms with back pain.  She does have some change to her EKG  as below.  I do not strongly suspect ischemia.  I am going to start with an echocardiogram and if this is normal I will likely bring her back for stress testing.  I do not think she be able to walk on a treadmill.  Therefore, she will have a  [employer].  Of note left arm blood pressure was slightly lower than the right.  Pulses were equal.  She does not have any new murmurs.  I do not suspect dissection and she does not have significant risk for pulmonary embolism.  RBBB:  This is new.  I would evaluate this as above.  TOBACCO ABUSE -  She has been intolerant of Wellbutrin and Chantix.    DYSLIPIDEMIA -  Lipids are as above.  She will continue with meds as listed.  HTN - Her blood pressures well controlled at home.  I have asked her to keep some blood pressures in the morning as I do not have these readings.  Further med adjustments will be based on blood pressure diary readings.

## 2017-02-23 NOTE — Patient Instructions (Signed)
We have spoken to the cardiologist and because of the similar pain that she had when she had her heart attack he is willing to see her this afternoon. We will get an EKG We will get a chest x-ray and thoracic spine films Because of the anxiety she is experiencing we will give her prescription for BuSpar to take 15 mg twice daily with the option of increasing this to 30 mg twice daily if the 15 mg does not work.  She will get back in touch with [place] regarding this.   She is also looking at some way to stop smoking as this is also worrying her. Since she is interested in smoking cessation again we will discussed this with her when the current problem is resolved.

## 2017-02-23 NOTE — Progress Notes (Signed)
Subjective:    Patient ID: Madison [name], female    DOB: [birth year], 71 y.o.   MRN: [id number]  HPI Patient here today for anxiety and nervousness. She has experienced some mid- back pain recently which mimicked her heart attack 18 years ago. She also has a lot of stress at home.  Patient has a lot of mid back pain which comes and goes.  This is similar to what she had when she had a heart attack 18 years ago.  She does not have an appointment with a cardiologist until December.  The patient says she is also been under a lot of stress more nervous and more anxiety.  The patient says there are a lot of things going on at home with things that need to be fixed at the house, worried about her husband worried about her daughter etc. she did say they did some leaf raking on back earlier in the fall but does not recall any specific event that caused this problem.  She says the problem comes and goes unrelated to activity.  There is no shortness of breath.  There is no pressure in the front just the back pain.  She is very stressed.  She denies any trouble with nausea vomiting diarrhea or blood in the stool and is passing her water without problems.     Patient Active Problem List   Diagnosis Date Noted  . Metabolic syndrome 25/42/7062  . Leukocytosis 01/04/2013  . TOBACCO ABUSE 10/29/2009  . THYROID CANCER 11/30/2007  . Hyperlipidemia 11/30/2007  . HTN (hypertension) 11/30/2007  . Coronary atherosclerosis 11/30/2007  . IRRITABLE BOWEL SYNDROME 11/30/2007  . Osteopenia 11/30/2007   Outpatient Encounter Medications as of 02/23/2017  Medication Sig  . aspirin 81 MG tablet Take 1 tablet (81 mg total) by mouth daily.  . fenofibrate 160 MG tablet TAKE ONE (1) TABLET EACH DAY  . losartan (COZAAR) 100 MG tablet TAKE ONE (1) TABLET EACH DAY  . Melatonin 3 MG CAPS Take 3 mg by mouth at bedtime.  . metoprolol (LOPRESSOR) 50 MG tablet Take 0.5 tablets (25 mg total) by mouth 2 (two) times daily.  .  nitroGLYCERIN (NITROSTAT) 0.4 MG SL tablet Place 1 tablet (0.4 mg total) under the tongue every 5 (five) minutes as needed for chest pain.  . rosuvastatin (CRESTOR) 40 MG tablet TAKE 1 TABLET AS DIRECTED  . SYNTHROID 137 MCG tablet Take 1 tablet (137 mcg total) by mouth daily before breakfast. TAKE 1 TABLET DAILY  . Vitamin D, Ergocalciferol, (DRISDOL) 50000 units CAPS capsule TAKE 1 CAPSULE EVERY WEEK   No facility-administered encounter medications on file as of 02/23/2017.       Review of Systems  Constitutional: Negative.   HENT: Negative.   Eyes: Negative.   Respiratory: Negative for shortness of breath.   Cardiovascular: Negative for palpitations.  Gastrointestinal: Negative.   Endocrine: Negative.   Genitourinary: Negative.   Musculoskeletal: Negative.        Mid line back pain x 2 weeks   Skin: Negative.   Allergic/Immunologic: Negative.   Neurological: Negative.   Hematological: Negative.   Psychiatric/Behavioral: The patient is nervous/anxious.        Stress       Objective:   Physical Exam  Constitutional: She is oriented to person, place, and time. She appears well-developed and well-nourished. She appears distressed.  The patient is somewhat anxious about the ongoing discomfort.  HENT:  Head: Normocephalic and atraumatic.  Right Ear: External ear  normal.  Left Ear: External ear normal.  Nose: Nose normal.  Mouth/Throat: Oropharynx is clear and moist. No oropharyngeal exudate.  Eyes: Conjunctivae and EOM are normal. Pupils are equal, round, and reactive to light. Right eye exhibits no discharge. Left eye exhibits no discharge. No scleral icterus.  Neck: Normal range of motion. Neck supple. No thyromegaly present.  Cardiovascular: Normal rate, regular rhythm, normal heart sounds and intact distal pulses.  No murmur heard. The heart is regular at 84/min  Pulmonary/Chest: Effort normal and breath sounds normal. No respiratory distress. She has no wheezes. She has  no rales.  Clear anteriorly and posteriorly  Abdominal: Soft. Bowel sounds are normal. She exhibits no mass. There is no tenderness. There is no rebound and no guarding.  Musculoskeletal: Normal range of motion. She exhibits no edema.  There is some tenderness in the thoracic spine near to where she has the discomfort   Lymphadenopathy:    She has no cervical adenopathy.  Neurological: She is alert and oriented to person, place, and time.  Skin: Skin is warm and dry. No rash noted.  Psychiatric: She has a normal mood and affect. Her behavior is normal. Judgment and thought content normal.  Increased anxiety  Nursing note and vitals reviewed.  BP (!) 156/90 (BP Location: Left Arm)   Pulse 85   Temp 97.7 F (36.5 C) (Oral)   Ht 5\' 5"  (1.651 m)   Wt 161 lb (73 kg)   BMI 26.79 kg/m   Repeat blood pressure by me with a large cuff manually with the patient sitting on the table was 180/100 in the right arm.  EKG with result pending=== Chest x-ray with results pending==== Thoracic spine with results pending===     Assessment & Plan:  1. Anxiety -Start BuSpar 15 mg twice daily.  Enough to try for 2 weeks to see if this helps settle her anxiety. - EKG 12-Lead  2. Acute midline thoracic back pain -May be musculoskeletal but could also be cardiac.  We will get an EKG today and she will see the cardiologist today.  This is a little more concerning because of the history of the previous heart attack had similar pain that she has now.  It does not associate itself with any kind of physical activity. - EKG 12-Lead - DG Chest 2 View; Future - DG Thoracic Spine 2 View; Future  3. History of heart attack - EKG 12-Lead - DG Chest 2 View; Future  4. THYROID CANCER -The patient is followed by an endocrinologist that [facility]  5. TOBACCO ABUSE -She still desires to stop smoking and we will pursue this more aggressively after her current issues are settled down  6.  Coronary artery disease due to lipid rich plaque -Follow-up with cardiology as planned  7. Essential hypertension -Blood pressure is elevated and she will be seen in the cardiac and he may want to add something else to the management of this  No orders of the defined types were placed in this encounter.  Patient Instructions  We have spoken to the cardiologist and because of the similar pain that she had when she had her heart attack he is willing to see her this afternoon. We will get an EKG We will get a chest x-ray and thoracic spine films Because of the anxiety she is experiencing we will give her prescription for BuSpar to take 15 mg twice daily with the option of increasing this to 30 mg twice  daily if the 15 mg does not work.  She will get back in touch with [place] regarding this.   She is also looking at some way to stop smoking as this is also worrying her. Since she is interested in smoking cessation again we will discussed this with her when the current problem is resolved.  Arrie Senate MD

## 2017-02-23 NOTE — Addendum Note (Signed)
Addended by: Zannie Cove on: 02/23/2017 04:34 PM   Modules accepted: Orders

## 2017-02-23 NOTE — Patient Instructions (Signed)
Medication Instructions:  Please increase Metoprolol to 50 mg twice a day. Continue all other medications as listed.  Testing/Procedures: Your physician has requested that you have an echocardiogram. Echocardiography is a painless test that uses sound waves to create images of your heart. It provides your doctor with information about the size and shape of your heart and how well your heart's chambers and valves are working. This procedure takes approximately one hour. There are no restrictions for this procedure.  Follow-Up: Follow up in 1 year with Dr. Percival Spanish in [place].  You will receive a letter in the mail 2 months before you are due.  Please call us when you receive this letter to schedule your follow up appointment.  If you need a refill on your cardiac medications before your next appointment, please call your pharmacy.  Thank you for choosing [facility]!!

## 2017-02-25 ENCOUNTER — Other Ambulatory Visit: Payer: Self-pay

## 2017-02-25 ENCOUNTER — Ambulatory Visit (HOSPITAL_COMMUNITY): Payer: Medicare Other | Attending: Cardiology

## 2017-02-25 DIAGNOSIS — I5030 Unspecified diastolic (congestive) heart failure: Secondary | ICD-10-CM | POA: Diagnosis not present

## 2017-02-25 DIAGNOSIS — I10 Essential (primary) hypertension: Secondary | ICD-10-CM

## 2017-02-25 DIAGNOSIS — R079 Chest pain, unspecified: Secondary | ICD-10-CM

## 2017-03-02 ENCOUNTER — Other Ambulatory Visit: Payer: Self-pay | Admitting: *Deleted

## 2017-03-02 DIAGNOSIS — I2510 Atherosclerotic heart disease of native coronary artery without angina pectoris: Secondary | ICD-10-CM

## 2017-03-02 DIAGNOSIS — I10 Essential (primary) hypertension: Secondary | ICD-10-CM

## 2017-03-02 DIAGNOSIS — I2583 Coronary atherosclerosis due to lipid rich plaque: Secondary | ICD-10-CM

## 2017-03-03 ENCOUNTER — Telehealth: Payer: Self-pay | Admitting: Family Medicine

## 2017-03-03 MED ORDER — LORAZEPAM 0.5 MG PO TABS: 0.2500 mg | ORAL_TABLET | Freq: Every day | ORAL | 0 refills | Status: DC | PRN

## 2017-03-03 NOTE — Telephone Encounter (Signed)
Call patient and make sure that she has started BuSpar

## 2017-03-03 NOTE — Telephone Encounter (Signed)
She has been on the me a week now - she thinks this is not strong enough.  She really wants like a xanax that she can take like once a day for break through anxiety

## 2017-03-03 NOTE — Telephone Encounter (Signed)
Lorazepam 0.5 1/2-1 daily if needed for anxiety #30 no refills

## 2017-03-09 ENCOUNTER — Telehealth (HOSPITAL_COMMUNITY): Payer: Self-pay

## 2017-03-09 NOTE — Telephone Encounter (Signed)
Encounter complete. 

## 2017-03-15 ENCOUNTER — Ambulatory Visit (HOSPITAL_COMMUNITY)
Admission: RE | Admit: 2017-03-15 | Discharge: 2017-03-15 | Disposition: A | Discharge status: Home / Self Care | Payer: Medicare Other | Source: Ambulatory Visit | Attending: Cardiology | Admitting: Cardiology

## 2017-03-15 DIAGNOSIS — Z85850 Personal history of malignant neoplasm of thyroid: Secondary | ICD-10-CM | POA: Insufficient documentation

## 2017-03-15 DIAGNOSIS — I10 Essential (primary) hypertension: Secondary | ICD-10-CM | POA: Diagnosis not present

## 2017-03-15 DIAGNOSIS — E785 Hyperlipidemia, unspecified: Secondary | ICD-10-CM | POA: Insufficient documentation

## 2017-03-15 DIAGNOSIS — Z86718 Personal history of other venous thrombosis and embolism: Secondary | ICD-10-CM | POA: Insufficient documentation

## 2017-03-15 DIAGNOSIS — I2583 Coronary atherosclerosis due to lipid rich plaque: Secondary | ICD-10-CM | POA: Insufficient documentation

## 2017-03-15 DIAGNOSIS — Z9861 Coronary angioplasty status: Secondary | ICD-10-CM | POA: Diagnosis not present

## 2017-03-15 DIAGNOSIS — F17200 Nicotine dependence, unspecified, uncomplicated: Secondary | ICD-10-CM | POA: Insufficient documentation

## 2017-03-15 DIAGNOSIS — I4510 Unspecified right bundle-branch block: Secondary | ICD-10-CM | POA: Diagnosis not present

## 2017-03-15 DIAGNOSIS — I252 Old myocardial infarction: Secondary | ICD-10-CM | POA: Diagnosis not present

## 2017-03-15 DIAGNOSIS — D72829 Elevated white blood cell count, unspecified: Secondary | ICD-10-CM | POA: Diagnosis not present

## 2017-03-15 DIAGNOSIS — Z8249 Family history of ischemic heart disease and other diseases of the circulatory system: Secondary | ICD-10-CM | POA: Insufficient documentation

## 2017-03-15 DIAGNOSIS — I2510 Atherosclerotic heart disease of native coronary artery without angina pectoris: Secondary | ICD-10-CM | POA: Diagnosis not present

## 2017-03-15 LAB — MYOCARDIAL PERFUSION IMAGING
LV dias vol: 72 mL (ref 46–106)
LV sys vol: 26 mL
Peak HR: 82 {beats}/min
Rest HR: 67 {beats}/min
SDS: 0
SRS: 1
SSS: 1
TID: 0.99

## 2017-03-15 MED ORDER — REGADENOSON 0.4 MG/5ML IV SOLN
0.4000 mg | Freq: Once | INTRAVENOUS | Status: AC
  Administered 2017-03-15: 0.4 mg via INTRAVENOUS

## 2017-03-15 MED ORDER — TECHNETIUM TC 99M TETROFOSMIN IV KIT
31.0000 | PACK | Freq: Once | INTRAVENOUS | Status: AC | PRN
  Administered 2017-03-15: 31 via INTRAVENOUS
  Filled 2017-03-15: qty 31

## 2017-03-15 MED ORDER — TECHNETIUM TC 99M TETROFOSMIN IV KIT
10.6000 | PACK | Freq: Once | INTRAVENOUS | Status: AC | PRN
  Administered 2017-03-15: 10.6 via INTRAVENOUS
  Filled 2017-03-15: qty 11

## 2017-03-16 ENCOUNTER — Encounter: Payer: Self-pay | Admitting: Cardiology

## 2017-03-21 ENCOUNTER — Other Ambulatory Visit: Payer: Self-pay | Admitting: Family Medicine

## 2017-03-24 ENCOUNTER — Ambulatory Visit: Payer: Medicare Other | Admitting: Cardiology

## 2017-04-19 HISTORY — PX: EYE SURGERY: SHX253

## 2017-04-26 DIAGNOSIS — H2513 Age-related nuclear cataract, bilateral: Secondary | ICD-10-CM | POA: Diagnosis not present

## 2017-04-26 DIAGNOSIS — H43811 Vitreous degeneration, right eye: Secondary | ICD-10-CM | POA: Diagnosis not present

## 2017-05-04 DIAGNOSIS — H2512 Age-related nuclear cataract, left eye: Secondary | ICD-10-CM | POA: Diagnosis not present

## 2017-05-10 DIAGNOSIS — E890 Postprocedural hypothyroidism: Secondary | ICD-10-CM | POA: Diagnosis not present

## 2017-05-10 DIAGNOSIS — Z79899 Other long term (current) drug therapy: Secondary | ICD-10-CM | POA: Diagnosis not present

## 2017-05-10 DIAGNOSIS — C73 Malignant neoplasm of thyroid gland: Secondary | ICD-10-CM | POA: Diagnosis not present

## 2017-05-16 DIAGNOSIS — H2512 Age-related nuclear cataract, left eye: Secondary | ICD-10-CM | POA: Diagnosis not present

## 2017-05-16 DIAGNOSIS — H25812 Combined forms of age-related cataract, left eye: Secondary | ICD-10-CM | POA: Diagnosis not present

## 2017-05-16 HISTORY — PX: EYE SURGERY: SHX253

## 2017-05-19 ENCOUNTER — Telehealth: Payer: Self-pay | Admitting: Family Medicine

## 2017-05-19 ENCOUNTER — Ambulatory Visit (INDEPENDENT_AMBULATORY_CARE_PROVIDER_SITE_OTHER): Payer: Medicare Other | Admitting: Physician Assistant

## 2017-05-19 ENCOUNTER — Encounter: Payer: Self-pay | Admitting: Physician Assistant

## 2017-05-19 VITALS — BP 189/95 | HR 71 | Temp 97.4°F | Ht 65.0 in | Wt 157.0 lb

## 2017-05-19 DIAGNOSIS — I10 Essential (primary) hypertension: Secondary | ICD-10-CM

## 2017-05-19 DIAGNOSIS — F411 Generalized anxiety disorder: Secondary | ICD-10-CM | POA: Diagnosis not present

## 2017-05-19 MED ORDER — AMLODIPINE BESYLATE 5 MG PO TABS: 5.0000 mg | ORAL_TABLET | Freq: Every day | ORAL | 2 refills | Status: DC

## 2017-05-19 MED ORDER — LORAZEPAM 1 MG PO TABS: 1.0000 mg | ORAL_TABLET | Freq: Every day | ORAL | 2 refills | Status: DC | PRN

## 2017-05-19 MED ORDER — BUSPIRONE HCL 30 MG PO TABS: 30.0000 mg | ORAL_TABLET | Freq: Two times a day (BID) | ORAL | 5 refills | Status: DC

## 2017-05-19 NOTE — Patient Instructions (Signed)
In a few days you may receive a survey in the mail or online from [employer] regarding your visit with us today. Please take a moment to fill this out. Your feedback is very important to our whole office. It can help us better understand your needs as well as improve your experience and satisfaction. Thank you for taking your time to complete it. We care about you.  Kirti Carl, PA-C  

## 2017-05-19 NOTE — Progress Notes (Signed)
BP (!) 189/95   Pulse 71   Temp (!) 97.4 F (36.3 C) (Oral)   Ht 5\' 5"  (1.651 m)   Wt 157 lb (71.2 kg)   BMI 26.13 kg/m    Subjective:    Patient ID: Madison [name], female    DOB: [birth year], 72 y.o.   MRN: [id number]  HPI: Madison [name] is a 72 y.o. female presenting on 05/19/2017 for Anxiety (Can't sleep and can't eat) Reports that the anxiety has returned since she has been out of buspar.  She could tell a great difference but still some residual anxiety. Took about 15 ativan in total since November.  Her BP has been up. Was up Monday at her cataract surgery.  She has been having a lot of high readings at home. Her cardiology evaluations in November were good. Depression screen [facility] 2/9 05/19/2017 02/23/2017 12/10/2016 11/25/2016 05/18/2016  Decreased Interest 1 2 0 0 0  Down, Depressed, Hopeless 1 1 0 0 0  PHQ - 2 Score 2 3 0 0 0  Altered sleeping 3 3 - - -  Tired, decreased energy 3 1 - - -  Change in appetite 3 2 - - -  Feeling bad or failure about yourself  1 1 - - -  Trouble concentrating 0 1 - - -  Moving slowly or fidgety/restless 1 0 - - -  Suicidal thoughts 0 0 - - -  PHQ-9 Score 13 11 - - -  Difficult doing work/chores - Very difficult - - -    Relevant past medical, surgical, family and social history reviewed and updated as indicated. Allergies and medications reviewed and updated.  Past Medical History:  Diagnosis Date  . CAD (coronary artery disease)    PCI of an occluded right coronary artery 2001. 70% LAD stenosis, 30% circumflex stenosis.  . Diverticulosis   . Hyperlipidemia   . Hypertension   . IBS (irritable bowel syndrome)   . Leukocytosis, unspecified 07/24/2012  . Myocardial infarction ([facility]) 2001  . Need for prophylactic hormone replacement therapy (postmenopausal)   . Osteopenia   . Osteoporosis   . Thyroid cancer [facility]) 2004   Dr. Clovia Cuff   . Thyroid nodule 2004  . Tobacco abuse     Past Surgical History:  Procedure Laterality Date  .  APPENDECTOMY  1955  . BACK SURGERY  06/2000  . CHOLECYSTECTOMY  07/2002  . LIPOMA EXCISION Right 08/2003   r arm   . THYROIDECTOMY Bilateral 02/2003    Review of Systems  Constitutional: Negative.  Negative for activity change, fatigue and fever.  HENT: Positive for nosebleeds.   Eyes: Negative.   Respiratory: Negative.  Negative for cough.   Cardiovascular: Negative.  Negative for chest pain.  Gastrointestinal: Negative.  Negative for abdominal pain.  Endocrine: Negative.   Genitourinary: Negative.  Negative for dysuria.  Musculoskeletal: Negative.   Skin: Negative.   Neurological: Negative.   Psychiatric/Behavioral: Positive for decreased concentration and sleep disturbance. Negative for suicidal ideas. The patient is nervous/anxious.     Allergies as of 05/19/2017      Reactions   Banana    Throat and ears itch,ears feel like they swell   Bupropion Hcl    REACTION: paranoia   Chantix [varenicline]    paranoia   Latex Rash   Zetia [ezetimibe]    Myalgia      Medication List        Accurate as of 05/19/17  2:55 PM. Always use your  most recent med list.          amLODipine 5 MG tablet Commonly known as:  NORVASC Take 1 tablet (5 mg total) by mouth daily.   aspirin 81 MG tablet Take 1 tablet (81 mg total) by mouth daily.   busPIRone 30 MG tablet Commonly known as:  BUSPAR Take 1 tablet (30 mg total) by mouth 2 (two) times daily.   fenofibrate 160 MG tablet Take 160 mg daily by mouth.   LORazepam 1 MG tablet Commonly known as:  ATIVAN Take 1 tablet (1 mg total) by mouth daily as needed for anxiety.   losartan 100 MG tablet Commonly known as:  COZAAR TAKE ONE (1) TABLET EACH DAY   Melatonin 3 MG Caps Take 3 mg by mouth at bedtime.   metoprolol tartrate 50 MG tablet Commonly known as:  LOPRESSOR Take 1 tablet (50 mg total) 2 (two) times daily by mouth.   nitroGLYCERIN 0.4 MG SL tablet Commonly known as:  NITROSTAT Place 1 tablet (0.4 mg total) every 5  (five) minutes as needed under the tongue for chest pain.   PROLENSA 0.07 % Soln Generic drug:  Bromfenac Sodium   rosuvastatin 40 MG tablet Commonly known as:  CRESTOR TAKE ONE TABLET DAILY AS DIRECTED   SYNTHROID 137 MCG tablet Generic drug:  levothyroxine Take 1 tablet (137 mcg total) by mouth daily before breakfast. TAKE 1 TABLET DAILY   Vitamin D (Ergocalciferol) 50000 units Caps capsule Commonly known as:  DRISDOL TAKE 1 CAPSULE EVERY WEEK          Objective:    BP (!) 189/95   Pulse 71   Temp (!) 97.4 F (36.3 C) (Oral)   Ht 5\' 5"  (1.651 m)   Wt 157 lb (71.2 kg)   BMI 26.13 kg/m   Allergies  Allergen Reactions  . Banana     Throat and ears itch,ears feel like they swell  . Bupropion Hcl     REACTION: paranoia  . Chantix [Varenicline]     paranoia  . Latex Rash  . Zetia [Ezetimibe]     Myalgia    Physical Exam  Constitutional: She is oriented to person, place, and time. She appears well-developed and well-nourished.  HENT:  Head: Normocephalic and atraumatic.  Right Ear: Tympanic membrane, external ear and ear canal normal.  Left Ear: Tympanic membrane, external ear and ear canal normal.  Nose: Nose normal. No rhinorrhea.  Mouth/Throat: Oropharynx is clear and moist and mucous membranes are normal. No oropharyngeal exudate or posterior oropharyngeal erythema.  Eyes: Conjunctivae and EOM are normal. Pupils are equal, round, and reactive to light.  Neck: Normal range of motion. Neck supple.  Cardiovascular: Normal rate, regular rhythm, normal heart sounds and intact distal pulses.  Pulmonary/Chest: Effort normal and breath sounds normal.  Abdominal: Soft. Bowel sounds are normal.  Neurological: She is alert and oriented to person, place, and time. She has normal reflexes.  Skin: Skin is warm and dry. No rash noted.  Psychiatric: She has a normal mood and affect. Her behavior is normal. Judgment and thought content normal.    Results for orders placed  or performed during the hospital encounter of 03/15/17  Myocardial Perfusion Imaging  Result Value Ref Range   Rest HR 67 bpm   Rest BP 208/102 mmHg   Peak HR 82 bpm   Peak BP 206/96 mmHg   SSS 1    SRS 1    SDS 0    TID 0.99  LV sys vol 26 mL   LV dias vol 72 46 - 106 mL      Assessment & Plan:   1. GAD (generalized anxiety disorder) - LORazepam (ATIVAN) 1 MG tablet; Take 1 tablet (1 mg total) by mouth daily as needed for anxiety.  Dispense: 30 tablet; Refill: 2 - busPIRone (BUSPAR) 30 MG tablet; Take 1 tablet (30 mg total) by mouth 2 (two) times daily.  Dispense: 60 tablet; Refill: 5  2. Essential hypertension - amLODipine (NORVASC) 5 MG tablet; Take 1 tablet (5 mg total) by mouth daily.  Dispense: 30 tablet; Refill: 2    Current Outpatient Medications:  .  amLODipine (NORVASC) 5 MG tablet, Take 1 tablet (5 mg total) by mouth daily., Disp: 30 tablet, Rfl: 2 .  aspirin 81 MG tablet, Take 1 tablet (81 mg total) by mouth daily., Disp: , Rfl:  .  busPIRone (BUSPAR) 30 MG tablet, Take 1 tablet (30 mg total) by mouth 2 (two) times daily., Disp: 60 tablet, Rfl: 5 .  fenofibrate 160 MG tablet, Take 160 mg daily by mouth., Disp: , Rfl:  .  LORazepam (ATIVAN) 1 MG tablet, Take 1 tablet (1 mg total) by mouth daily as needed for anxiety., Disp: 30 tablet, Rfl: 2 .  losartan (COZAAR) 100 MG tablet, TAKE ONE (1) TABLET EACH DAY, Disp: 90 tablet, Rfl: 1 .  Melatonin 3 MG CAPS, Take 3 mg by mouth at bedtime., Disp: , Rfl:  .  metoprolol tartrate (LOPRESSOR) 50 MG tablet, Take 1 tablet (50 mg total) 2 (two) times daily by mouth., Disp: 180 tablet, Rfl: 3 .  nitroGLYCERIN (NITROSTAT) 0.4 MG SL tablet, Place 1 tablet (0.4 mg total) every 5 (five) minutes as needed under the tongue for chest pain., Disp: 25 tablet, Rfl: 3 .  PROLENSA 0.07 % SOLN, , Disp: , Rfl:  .  rosuvastatin (CRESTOR) 40 MG tablet, TAKE ONE TABLET DAILY AS DIRECTED, Disp: 90 tablet, Rfl: 1 .  SYNTHROID 137 MCG tablet, Take 1  tablet (137 mcg total) by mouth daily before breakfast. TAKE 1 TABLET DAILY, Disp: 34 tablet, Rfl: 6 .  Vitamin D, Ergocalciferol, (DRISDOL) 50000 units CAPS capsule, TAKE 1 CAPSULE EVERY WEEK, Disp: 12 capsule, Rfl: 3 Continue all other maintenance medications as listed above.  Follow up plan: Return for keep follow up with Dr. Laurance Flatten.  Educational handout given for [place] PA-C [place] [address]  [place], [place] [address] [phone]   05/19/2017, 2:55 PM

## 2017-05-24 DIAGNOSIS — H2511 Age-related nuclear cataract, right eye: Secondary | ICD-10-CM | POA: Diagnosis not present

## 2017-05-30 ENCOUNTER — Encounter: Payer: Self-pay | Admitting: Family Medicine

## 2017-05-30 ENCOUNTER — Ambulatory Visit (INDEPENDENT_AMBULATORY_CARE_PROVIDER_SITE_OTHER): Payer: Medicare Other | Admitting: Family Medicine

## 2017-05-30 VITALS — BP 137/81 | HR 61 | Temp 98.2°F | Ht 65.0 in | Wt 154.0 lb

## 2017-05-30 DIAGNOSIS — E7800 Pure hypercholesterolemia, unspecified: Secondary | ICD-10-CM

## 2017-05-30 DIAGNOSIS — E559 Vitamin D deficiency, unspecified: Secondary | ICD-10-CM | POA: Diagnosis not present

## 2017-05-30 DIAGNOSIS — C73 Malignant neoplasm of thyroid gland: Secondary | ICD-10-CM | POA: Diagnosis not present

## 2017-05-30 DIAGNOSIS — F17200 Nicotine dependence, unspecified, uncomplicated: Secondary | ICD-10-CM

## 2017-05-30 DIAGNOSIS — I10 Essential (primary) hypertension: Secondary | ICD-10-CM

## 2017-05-30 DIAGNOSIS — J410 Simple chronic bronchitis: Secondary | ICD-10-CM | POA: Diagnosis not present

## 2017-05-30 DIAGNOSIS — E8881 Metabolic syndrome: Secondary | ICD-10-CM

## 2017-05-30 DIAGNOSIS — Z1211 Encounter for screening for malignant neoplasm of colon: Secondary | ICD-10-CM | POA: Diagnosis not present

## 2017-05-30 DIAGNOSIS — F411 Generalized anxiety disorder: Secondary | ICD-10-CM

## 2017-05-30 DIAGNOSIS — I2510 Atherosclerotic heart disease of native coronary artery without angina pectoris: Secondary | ICD-10-CM | POA: Diagnosis not present

## 2017-05-30 NOTE — Addendum Note (Signed)
Addended by: Zannie Cove on: 05/30/2017 12:12 PM   Modules accepted: Orders

## 2017-05-30 NOTE — Progress Notes (Signed)
Subjective:    Patient ID: Madison [name], female    DOB: 06-Sep-1945, 72 y.o.   MRN: [id number]  HPI Pt here for follow up and management of chronic medical problems which includes hypertension, hyperlipidemia and anxiety. She is taking medication regularly.  The patient is doing well today other than some increased issues with anxiety.  She is due to return in FOBT and to get lab work today.  She does not request refills on any medication.  Her weight is down 3 pounds from the previous visit.  The patient is stable as far as her complaints are concerned and she says that she is still taking her BuSpar regularly and will only reserve the Lorazepam for any panic attack mode but not on a regular basis.  I will agree with this.  She has 1 more cataract surgery planned soon next week.  She denies any chest pain pressure or tightness.  She sees the cardiologist yearly.  She did have some recent studies by him which she would like for me to review today as she did not hear the results from him and this would include her myocardial perfusion scan and echocardiogram.  She denies any trouble with swallowing heartburn indigestion nausea vomiting diarrhea or blood in the stool other than her usual irritable bowel syndrome.  Her last colonoscopy was in September 2009.  She is passing her water without problems.     Patient Active Problem List   Diagnosis Date Noted  . GAD (generalized anxiety disorder) 05/19/2017  . Chest pain 02/23/2017  . RBBB 02/23/2017  . Metabolic syndrome 52/48/1859  . Leukocytosis 01/04/2013  . TOBACCO ABUSE 10/29/2009  . THYROID CANCER 11/30/2007  . Hyperlipidemia 11/30/2007  . HTN (hypertension) 11/30/2007  . Coronary atherosclerosis 11/30/2007  . IRRITABLE BOWEL SYNDROME 11/30/2007  . Osteopenia 11/30/2007   Outpatient Encounter Medications as of 05/30/2017  Medication Sig  . amLODipine (NORVASC) 5 MG tablet Take 1 tablet (5 mg total) by mouth daily.  Marland Kitchen aspirin 81 MG  tablet Take 1 tablet (81 mg total) by mouth daily.  . busPIRone (BUSPAR) 30 MG tablet Take 1 tablet (30 mg total) by mouth 2 (two) times daily.  . fenofibrate 160 MG tablet Take 160 mg daily by mouth.  Marland Kitchen LORazepam (ATIVAN) 1 MG tablet Take 1 tablet (1 mg total) by mouth daily as needed for anxiety.  Marland Kitchen losartan (COZAAR) 100 MG tablet TAKE ONE (1) TABLET EACH DAY  . Melatonin 3 MG CAPS Take 3 mg by mouth at bedtime.  . metoprolol tartrate (LOPRESSOR) 50 MG tablet Take 1 tablet (50 mg total) 2 (two) times daily by mouth.  . nitroGLYCERIN (NITROSTAT) 0.4 MG SL tablet Place 1 tablet (0.4 mg total) every 5 (five) minutes as needed under the tongue for chest pain.  Marland Kitchen PROLENSA 0.07 % SOLN   . rosuvastatin (CRESTOR) 40 MG tablet TAKE ONE TABLET DAILY AS DIRECTED  . SYNTHROID 137 MCG tablet Take 1 tablet (137 mcg total) by mouth daily before breakfast. TAKE 1 TABLET DAILY  . Vitamin D, Ergocalciferol, (DRISDOL) 50000 units CAPS capsule TAKE 1 CAPSULE EVERY WEEK   No facility-administered encounter medications on file as of 05/30/2017.      Review of Systems  Constitutional: Negative.   HENT: Negative.   Eyes: Negative.   Respiratory: Negative.   Cardiovascular: Negative.   Gastrointestinal: Negative.   Endocrine: Negative.   Genitourinary: Negative.   Musculoskeletal: Negative.   Skin: Negative.   Allergic/Immunologic: Negative.  Neurological: Negative.   Hematological: Negative.   Psychiatric/Behavioral: The patient is nervous/anxious.        Objective:   Physical Exam  Constitutional: She is oriented to person, place, and time. She appears well-developed and well-nourished.  The patient is pleasant and alert and says that she will stick with the BuSpar with only as needed use of the lorazepam for severe anxiety.  HENT:  Head: Normocephalic and atraumatic.  Right Ear: External ear normal.  Left Ear: External ear normal.  Mouth/Throat: Oropharynx is clear and moist. No oropharyngeal  exudate.  Right nasal passage dry and irritated  Eyes: Conjunctivae and EOM are normal. Pupils are equal, round, and reactive to light. Right eye exhibits no discharge. Left eye exhibits no discharge. No scleral icterus.  Continues to follow-up with the ophthalmologist for future cataract surgery  Neck: Normal range of motion. Neck supple. No thyromegaly present.  No bruits thyromegaly or anterior cervical adenopathy or thyroid masses.  Cardiovascular: Normal rate, regular rhythm, normal heart sounds and intact distal pulses.  No murmur heard. Heart is regular at 72/min  Pulmonary/Chest: Effort normal. No respiratory distress. She has wheezes. She has no rales.  Scattered inspiratory wheezes bilaterally are diminished breath sounds  Abdominal: Soft. Bowel sounds are normal. She exhibits no mass. There is no tenderness. There is no rebound and no guarding.  Soft without masses tenderness or organ enlargement or bruits  Musculoskeletal: Normal range of motion. She exhibits no edema.  Lymphadenopathy:    She has no cervical adenopathy.  Neurological: She is alert and oriented to person, place, and time. She has normal reflexes. No cranial nerve deficit.  Skin: Skin is warm and dry. No rash noted.  Psychiatric: She has a normal mood and affect. Her behavior is normal. Judgment and thought content normal.  Nursing note and vitals reviewed.  BP 137/81 (BP Location: Left Arm)   Pulse 61   Temp 98.2 F (36.8 C) (Oral)   Ht 5' 5"  (1.651 m)   Wt 154 lb (69.9 kg)   BMI 25.63 kg/m         Assessment & Plan:  1. Essential hypertension -Blood pressure is good today and she will continue with current treatment and avoidance of sodium - CBC with Differential/Platelet - BMP8+EGFR - Hepatic function panel  2. Vitamin D deficiency -Continue with current treatment pending results of lab work - CBC with Differential/Platelet - VITAMIN D 25 Hydroxy (Vit-D Deficiency, Fractures)  3. Pure  hypercholesterolemia -Continue current treatment and aggressive therapeutic lifestyle changes - CBC with Differential/Platelet - Lipid panel  4. Metabolic syndrome -Continue aggressive therapeutic lifestyle changes through diet and weight loss. - CBC with Differential/Platelet  5. THYROID CANCER -Follow-up with [facility] endocrinologist as planned - CBC with Differential/Platelet  6. GAD (generalized anxiety disorder) -Patient will continue with BuSpar with only as needed and rare use of lorazepam if needed for panic attack - CBC with Differential/Platelet  7. Atherosclerosis of native coronary artery without angina pectoris, unspecified whether native or transplanted heart -Follow-up with cardiology yearly  8. TOBACCO ABUSE -Continue to set dates and taper all and quit smoking  9. Thyroid cancer [facility]) -Follow-up with endocrinologist  10. Simple chronic bronchitis ([place]) -Continue to make all efforts at stopping smoking and drinking plenty of fluids and staying well-hydrated  Patient Instructions                       Medicare Annual Wellness Visit  [facility] and the  medical providers at [facility] strive to bring you the best medical care.  In doing so we not only want to address your current medical conditions and concerns but also to detect new conditions early and prevent illness, disease and health-related problems.    Medicare offers a yearly Wellness Visit which allows our clinical staff to assess your need for preventative services including immunizations, lifestyle education, counseling to decrease risk of preventable diseases and screening for fall risk and other medical concerns.    This visit is provided free of charge (no copay) for all Medicare recipients. The clinical pharmacists at [facility] have begun to conduct these Wellness Visits which will also include a thorough review of all your medications.    As you  primary medical provider recommend that you make an appointment for your Annual Wellness Visit if you have not done so already this year.  You may set up this appointment before you leave today or you may call back [phone]) and schedule an appointment.  Please make sure when you call that you mention that you are scheduling your Annual Wellness Visit with the clinical pharmacist so that the appointment may be made for the proper length of time.     Continue current medications. Continue good therapeutic lifestyle changes which include good diet and exercise. Fall precautions discussed with patient. If an FOBT was given today- please return it to our front desk. If you are over 1 years old - you may need Prevnar 36 or the adult Pneumonia vaccine.  **Flu shots are available--- please call and schedule a FLU-CLINIC appointment**  After your visit with [place] today you will receive a survey in the mail or online from [employer] regarding your care with [place]. Please take a moment to fill this out. Your feedback is very important to [place] as you can help [place] better understand your patient needs as well as improve your experience and satisfaction. WE CARE ABOUT YOU!!!   Patient understands that she will need a colonoscopy in the fall She should follow-up with the ophthalmologist as planned She should continue to work on smoking cessation and stop smoking completely and she understands the importance of this. The recent echocardiogram and myocardial perfusion scan results were reviewed with her today from the cardiologist and she was given copies of this for her records.    Arrie Senate MD

## 2017-05-30 NOTE — Patient Instructions (Addendum)
Medicare Annual Wellness Visit  [facility] and the medical providers at [facility] strive to bring you the best medical care.  In doing so we not only want to address your current medical conditions and concerns but also to detect new conditions early and prevent illness, disease and health-related problems.    Medicare offers a yearly Wellness Visit which allows our clinical staff to assess your need for preventative services including immunizations, lifestyle education, counseling to decrease risk of preventable diseases and screening for fall risk and other medical concerns.    This visit is provided free of charge (no copay) for all Medicare recipients. The clinical pharmacists at [facility] have begun to conduct these Wellness Visits which will also include a thorough review of all your medications.    As you primary medical provider recommend that you make an appointment for your Annual Wellness Visit if you have not done so already this year.  You may set up this appointment before you leave today or you may call back [phone]) and schedule an appointment.  Please make sure when you call that you mention that you are scheduling your Annual Wellness Visit with the clinical pharmacist so that the appointment may be made for the proper length of time.     Continue current medications. Continue good therapeutic lifestyle changes which include good diet and exercise. Fall precautions discussed with patient. If an FOBT was given today- please return it to our front desk. If you are over 49 years old - you may need Prevnar 57 or the adult Pneumonia vaccine.  **Flu shots are available--- please call and schedule a FLU-CLINIC appointment**  After your visit with [place] today you will receive a survey in the mail or online from [employer] regarding your care with [place]. Please take a moment to fill this out. Your feedback is very  important to [place] as you can help [place] better understand your patient needs as well as improve your experience and satisfaction. WE CARE ABOUT YOU!!!   Patient understands that she will need a colonoscopy in the fall She should follow-up with the ophthalmologist as planned She should continue to work on smoking cessation and stop smoking completely and she understands the importance of this. The recent echocardiogram and myocardial perfusion scan results were reviewed with her today from the cardiologist and she was given copies of this for her records.

## 2017-05-31 LAB — CBC WITH DIFFERENTIAL/PLATELET
Basophils Absolute: 0.1 10*3/uL (ref 0.0–0.2)
Basos: 1 %
EOS (ABSOLUTE): 0.4 10*3/uL (ref 0.0–0.4)
Eos: 3 %
Hematocrit: 49.7 % — ABNORMAL HIGH (ref 34.0–46.6)
Hemoglobin: 16.7 g/dL — ABNORMAL HIGH (ref 11.1–15.9)
Immature Grans (Abs): 0 10*3/uL (ref 0.0–0.1)
Immature Granulocytes: 0 %
Lymphocytes Absolute: 3.3 10*3/uL — ABNORMAL HIGH (ref 0.7–3.1)
Lymphs: 28 %
MCH: 30.5 pg (ref 26.6–33.0)
MCHC: 33.6 g/dL (ref 31.5–35.7)
MCV: 91 fL (ref 79–97)
Monocytes Absolute: 0.6 10*3/uL (ref 0.1–0.9)
Monocytes: 5 %
Neutrophils Absolute: 7.7 10*3/uL — ABNORMAL HIGH (ref 1.4–7.0)
Neutrophils: 63 %
Platelets: 249 10*3/uL (ref 150–379)
RBC: 5.48 x10E6/uL — ABNORMAL HIGH (ref 3.77–5.28)
RDW: 13.5 % (ref 12.3–15.4)
WBC: 12 10*3/uL — ABNORMAL HIGH (ref 3.4–10.8)

## 2017-05-31 LAB — BMP8+EGFR
BUN/Creatinine Ratio: 16 (ref 12–28)
BUN: 14 mg/dL (ref 8–27)
CO2: 18 mmol/L — ABNORMAL LOW (ref 20–29)
Calcium: 9.5 mg/dL (ref 8.7–10.3)
Chloride: 104 mmol/L (ref 96–106)
Creatinine, Ser: 0.88 mg/dL (ref 0.57–1.00)
GFR calc Af Amer: 77 mL/min/{1.73_m2} (ref >59)
GFR calc non Af Amer: 67 mL/min/{1.73_m2} (ref >59)
Glucose: 97 mg/dL (ref 65–99)
Potassium: 4 mmol/L (ref 3.5–5.2)
Sodium: 141 mmol/L (ref 134–144)

## 2017-05-31 LAB — LIPID PANEL
Chol/HDL Ratio: 4 ratio (ref 0.0–4.4)
Cholesterol, Total: 117 mg/dL (ref 100–199)
HDL: 29 mg/dL — ABNORMAL LOW (ref >39)
LDL Calculated: 63 mg/dL (ref 0–99)
Triglycerides: 124 mg/dL (ref 0–149)
VLDL Cholesterol Cal: 25 mg/dL (ref 5–40)

## 2017-05-31 LAB — HEPATIC FUNCTION PANEL
ALT: 19 IU/L (ref 0–32)
AST: 21 IU/L (ref 0–40)
Albumin: 4.6 g/dL (ref 3.5–4.8)
Alkaline Phosphatase: 48 IU/L (ref 39–117)
Bilirubin Total: 0.5 mg/dL (ref 0.0–1.2)
Bilirubin, Direct: 0.11 mg/dL (ref 0.00–0.40)
Total Protein: 7.1 g/dL (ref 6.0–8.5)

## 2017-05-31 LAB — VITAMIN D 25 HYDROXY (VIT D DEFICIENCY, FRACTURES): Vit D, 25-Hydroxy: 62.4 ng/mL (ref 30.0–100.0)

## 2017-05-31 MED ORDER — LOSARTAN POTASSIUM 100 MG PO TABS: 100.0000 mg | ORAL_TABLET | Freq: Every day | ORAL | 3 refills | Status: DC

## 2017-05-31 MED ORDER — ROSUVASTATIN CALCIUM 40 MG PO TABS: ORAL_TABLET | ORAL | 3 refills | Status: DC

## 2017-05-31 NOTE — Addendum Note (Signed)
Addended by: Zannie Cove on: 05/31/2017 02:46 PM   Modules accepted: Orders

## 2017-06-06 DIAGNOSIS — H25811 Combined forms of age-related cataract, right eye: Secondary | ICD-10-CM | POA: Diagnosis not present

## 2017-06-06 DIAGNOSIS — H2511 Age-related nuclear cataract, right eye: Secondary | ICD-10-CM | POA: Diagnosis not present

## 2017-08-15 ENCOUNTER — Other Ambulatory Visit: Payer: Self-pay | Admitting: Physician Assistant

## 2017-08-15 DIAGNOSIS — I10 Essential (primary) hypertension: Secondary | ICD-10-CM

## 2017-08-19 ENCOUNTER — Telehealth: Payer: Self-pay | Admitting: Family Medicine

## 2017-08-22 ENCOUNTER — Other Ambulatory Visit: Payer: Self-pay | Admitting: Physician Assistant

## 2017-08-22 DIAGNOSIS — F411 Generalized anxiety disorder: Secondary | ICD-10-CM

## 2017-09-05 ENCOUNTER — Other Ambulatory Visit: Payer: Self-pay | Admitting: Family Medicine

## 2017-11-18 ENCOUNTER — Encounter: Payer: Self-pay | Admitting: Family Medicine

## 2017-11-21 ENCOUNTER — Ambulatory Visit (INDEPENDENT_AMBULATORY_CARE_PROVIDER_SITE_OTHER): Payer: Medicare Other | Admitting: Family

## 2017-11-21 ENCOUNTER — Encounter: Payer: Self-pay | Admitting: Family

## 2017-11-21 VITALS — BP 125/73 | HR 78 | Temp 99.4°F | Ht 65.0 in | Wt 158.4 lb

## 2017-11-21 DIAGNOSIS — N814 Uterovaginal prolapse, unspecified: Secondary | ICD-10-CM | POA: Diagnosis not present

## 2017-11-21 MED ORDER — ESTROGENS, CONJUGATED 0.625 MG/GM VA CREA: TOPICAL_CREAM | VAGINAL | 12 refills | Status: DC

## 2017-11-21 NOTE — Progress Notes (Signed)
   Subjective:    Patient ID: Madison [name], female    DOB: 1945/09/30, 72 y.o.   MRN: [id number]  Chief Complaint  Patient presents with  . pelvic check for a protrusion    HPI PT presents to the office today with complaints of "something protruding out of her vagina" when straining or standing too long. States she noticed this in the last week and is unchanged. She states she has had two vaginal births.   She reports she has IBS and has intermittent constipation and diarrhea. Denies any urinary or stool incontinence except if she has diarrhea and can not make it to the restroom.  However, states this has only happened a few times.   Denies any urinary frequency or dysuria. She states she is not sexually active.    Review of Systems  All other systems reviewed and are negative.      Objective:   Physical Exam  Constitutional: She is oriented to person, place, and time. She appears well-developed and well-nourished. No distress.  HENT:  Head: Normocephalic and atraumatic.  Right Ear: External ear normal.  Left Ear: External ear normal.  Mouth/Throat: Oropharynx is clear and moist.  Eyes: Pupils are equal, round, and reactive to light.  Neck: Normal range of motion. Neck supple. No thyromegaly present.  Cardiovascular: Normal rate, regular rhythm, normal heart sounds and intact distal pulses.  No murmur heard. Pulmonary/Chest: Effort normal and breath sounds normal. No respiratory distress. She has no wheezes.  Abdominal: Soft. Bowel sounds are normal. She exhibits no distension. There is no tenderness.  Genitourinary: Vagina normal and uterus normal.  Genitourinary Comments: Grade 2 cystocele   Musculoskeletal: Normal range of motion. She exhibits no edema or tenderness.  Neurological: She is alert and oriented to person, place, and time. She has normal reflexes. No cranial nerve deficit.  Skin: Skin is warm and dry.  Psychiatric: She has a normal mood and affect. Her  behavior is normal. Judgment and thought content normal.  Vitals reviewed.   BP 125/73   Pulse 78   Temp 99.4 F (37.4 C) (Oral)   Ht 5\' 5"  (1.651 m)   Wt 158 lb 6.4 oz (71.8 kg)   BMI 26.36 kg/m        Assessment & Plan:  Loralei H Connett comes in today with chief complaint of pelvic check for a protrusion   Diagnosis and orders addressed:  1. Cystocele with prolapse Start Kegel exercises Avoid straining, constipation Follow up with Urologists Keep F/U with PCP  - Ambulatory referral to Urology - conjugated estrogens (PREMARIN) vaginal cream; 1 application 3 times weekly  Dispense: 42.5 g; Refill: [place], FNP

## 2017-11-21 NOTE — Patient Instructions (Addendum)
About Cystocele  Overview  The pelvic organs, including the bladder, are normally supported by pelvic floor muscles and ligaments.  When these muscles and ligaments are stretched, weakened or torn, the wall between the bladder and the vagina sags or herniates causing a prolapse, sometimes called a cystocele.  This condition may cause discomfort and problems with emptying the bladder.  It can be present in various stages.  Some people are not aware of the changes.  Others may notice changes at the vaginal opening or a feeling of the bladder dropping outside the body.  Causes of a Cystocele  A cystocele is usually caused by muscle straining or stretching during childbirth.  In addition, cystocele is more common after menopause, because the hormone estrogen helps keep the elastic tissues around the pelvic organs strong.  A cystocele is more likely to occur when levels of estrogen decrease.  Other causes include: heavy lifting, chronic coughing, previous pelvic surgery and obesity.  Symptoms  A bladder that has dropped from its normal position may cause: unwanted urine leakage (stress incontinence), frequent urination or urge to urinate, incomplete emptying of the bladder (not feeling bladder relief after emptying), pain or discomfort in the vagina, pelvis, groin, lower back or lower abdomen and frequent urinary tract infections.  Mild cases may not cause any symptoms.  Treatment Options  Pelvic floor (Kegel) exercises:  Strength training the muscles in your genital area  Behavioral changes: Treating and preventing constipation, taking time to empty your bladder properly, learning to lift properly and/or avoid heavy lifting when possible, stopping smoking, avoiding weight gain and treating a chronic cough or bronchitis.  A pessary: A vaginal support device is sometimes used to help pelvic support caused by muscle and ligament changes.  Surgery: Surgical repair may be necessary if symptoms cannot  be managed with exercise, behavioral changes and a pessary.  Surgery is usually considered for severe cases.   2007, [facility]rapeutic

## 2017-11-24 DIAGNOSIS — N8111 Cystocele, midline: Secondary | ICD-10-CM | POA: Diagnosis not present

## 2017-11-24 DIAGNOSIS — R350 Frequency of micturition: Secondary | ICD-10-CM | POA: Diagnosis not present

## 2017-11-29 ENCOUNTER — Encounter: Payer: Self-pay | Admitting: Family Medicine

## 2017-11-29 ENCOUNTER — Ambulatory Visit (INDEPENDENT_AMBULATORY_CARE_PROVIDER_SITE_OTHER): Payer: Medicare Other | Admitting: Family Medicine

## 2017-11-29 ENCOUNTER — Other Ambulatory Visit: Payer: Self-pay | Admitting: Physician Assistant

## 2017-11-29 VITALS — BP 139/78 | HR 70 | Temp 97.5°F | Ht 65.0 in | Wt 157.0 lb

## 2017-11-29 DIAGNOSIS — E7800 Pure hypercholesterolemia, unspecified: Secondary | ICD-10-CM

## 2017-11-29 DIAGNOSIS — E8881 Metabolic syndrome: Secondary | ICD-10-CM

## 2017-11-29 DIAGNOSIS — I2510 Atherosclerotic heart disease of native coronary artery without angina pectoris: Secondary | ICD-10-CM

## 2017-11-29 DIAGNOSIS — I10 Essential (primary) hypertension: Secondary | ICD-10-CM

## 2017-11-29 DIAGNOSIS — E559 Vitamin D deficiency, unspecified: Secondary | ICD-10-CM | POA: Diagnosis not present

## 2017-11-29 DIAGNOSIS — Z1211 Encounter for screening for malignant neoplasm of colon: Secondary | ICD-10-CM

## 2017-11-29 DIAGNOSIS — C73 Malignant neoplasm of thyroid gland: Secondary | ICD-10-CM | POA: Diagnosis not present

## 2017-11-29 DIAGNOSIS — F411 Generalized anxiety disorder: Secondary | ICD-10-CM

## 2017-11-29 DIAGNOSIS — E785 Hyperlipidemia, unspecified: Secondary | ICD-10-CM

## 2017-11-29 MED ORDER — NITROGLYCERIN 0.4 MG SL SUBL: 0.4000 mg | SUBLINGUAL_TABLET | SUBLINGUAL | 3 refills | Status: DC | PRN

## 2017-11-29 MED ORDER — FENOFIBRATE 160 MG PO TABS: ORAL_TABLET | ORAL | 3 refills | Status: DC

## 2017-11-29 MED ORDER — LOSARTAN POTASSIUM 100 MG PO TABS: 100.0000 mg | ORAL_TABLET | Freq: Every day | ORAL | 3 refills | Status: DC

## 2017-11-29 MED ORDER — METOPROLOL TARTRATE 50 MG PO TABS: 50.0000 mg | ORAL_TABLET | Freq: Two times a day (BID) | ORAL | 3 refills | Status: DC

## 2017-11-29 MED ORDER — ROSUVASTATIN CALCIUM 40 MG PO TABS: ORAL_TABLET | ORAL | 3 refills | Status: DC

## 2017-11-29 MED ORDER — BUSPIRONE HCL 30 MG PO TABS: 30.0000 mg | ORAL_TABLET | Freq: Two times a day (BID) | ORAL | 3 refills | Status: DC

## 2017-11-29 MED ORDER — AMLODIPINE BESYLATE 5 MG PO TABS: ORAL_TABLET | ORAL | 3 refills | Status: DC

## 2017-11-29 MED ORDER — VITAMIN D (ERGOCALCIFEROL) 1.25 MG (50000 UNIT) PO CAPS: 50000.0000 [IU] | ORAL_CAPSULE | ORAL | 3 refills | Status: DC

## 2017-11-29 MED ORDER — LORAZEPAM 1 MG PO TABS: 1.0000 mg | ORAL_TABLET | Freq: Every day | ORAL | 5 refills | Status: DC | PRN

## 2017-11-29 NOTE — Progress Notes (Signed)
Subjective:    Patient ID: Madison [name], female    DOB: 01-Aug-1945, 72 y.o.   MRN: [id number]  HPI Pt here for follow up and management of chronic medical problems which includes hypertension and hyperlipidemia. She is taking medication regularly.  The patient is doing well overall but is currently having problems with a cystocele and is seeing the gynecologist now.  She is due to get lab work today and return in FOBT.  Her last colonoscopy was actually in September 2009.  We will question this further with her.  Patient has coronary atherosclerosis.  She has had thyroid cancer and still has tobacco abuse.  She is on thyroid replacement currently.  She is taking vitamin D 50,000 units once a week and medicine for blood pressure.  The patient is pleasant and unfortunately she is still smoking.  She is seeing a gynecologist soon to talk about doing a cystocele repair and possibly a rectocele repair.  She did see the urologist and this is the person the urologist recommended.  Today she denies any chest pain pressure tightness or shortness of breath.  She denies any trouble with swallowing heartburn indigestion nausea or vomiting.  She has irritable bowel syndrome mixed type with diarrhea and constipation.  She has some bright red blood in the stool which comes from her hemorrhoids.  She has ongoing problems with passing her water due to the cystocele.  She still sees the endocrinologist periodically and sees the cardiologist usually yearly in the fall.   Patient Active Problem List   Diagnosis Date Noted  . GAD (generalized anxiety disorder) 05/19/2017  . Chest pain 02/23/2017  . RBBB 02/23/2017  . Metabolic syndrome 49/44/9675  . Leukocytosis 01/04/2013  . TOBACCO ABUSE 10/29/2009  . THYROID CANCER 11/30/2007  . Hyperlipidemia 11/30/2007  . HTN (hypertension) 11/30/2007  . Coronary atherosclerosis 11/30/2007  . IRRITABLE BOWEL SYNDROME 11/30/2007  . Osteopenia 11/30/2007   Outpatient  Encounter Medications as of 11/29/2017  Medication Sig  . amLODipine (NORVASC) 5 MG tablet TAKE ONE (1) TABLET EACH DAY  . aspirin 81 MG tablet Take 1 tablet (81 mg total) by mouth daily.  . busPIRone (BUSPAR) 30 MG tablet TAKE ONE TABLET BY MOUTH TWICE DAILY  . conjugated estrogens (PREMARIN) vaginal cream 1 application 3 times weekly  . fenofibrate 160 MG tablet TAKE ONE (1) TABLET EACH DAY  . LORazepam (ATIVAN) 1 MG tablet Take 1 tablet (1 mg total) by mouth daily as needed for anxiety.  Marland Kitchen losartan (COZAAR) 100 MG tablet Take 1 tablet (100 mg total) by mouth daily.  . Melatonin 3 MG CAPS Take 3 mg by mouth at bedtime.  . metoprolol tartrate (LOPRESSOR) 50 MG tablet Take 1 tablet (50 mg total) 2 (two) times daily by mouth.  . nitroGLYCERIN (NITROSTAT) 0.4 MG SL tablet Place 1 tablet (0.4 mg total) every 5 (five) minutes as needed under the tongue for chest pain.  . rosuvastatin (CRESTOR) 40 MG tablet TAKE ONE TABLET DAILY AS DIRECTED  . SYNTHROID 137 MCG tablet Take 1 tablet (137 mcg total) by mouth daily before breakfast. TAKE 1 TABLET DAILY  . Vitamin D, Ergocalciferol, (DRISDOL) 50000 units CAPS capsule TAKE 1 CAPSULE EVERY WEEK   No facility-administered encounter medications on file as of 11/29/2017.       Review of Systems  Constitutional: Negative.   HENT: Negative.   Eyes: Negative.   Respiratory: Negative.   Cardiovascular: Negative.   Gastrointestinal: Negative.   Endocrine: Negative.  Genitourinary: Positive for pelvic pain (bladder dropped - went to urology / GYN ).  Musculoskeletal: Negative.   Skin: Negative.   Allergic/Immunologic: Negative.   Neurological: Negative.   Hematological: Negative.   Psychiatric/Behavioral: Negative.        Objective:   Physical Exam  Constitutional: She is oriented to person, place, and time. She appears well-developed and well-nourished. No distress.  The patient is pleasant and alert and not excited about having to have surgical  repair of a cystocele.  HENT:  Head: Normocephalic and atraumatic.  Right Ear: External ear normal.  Left Ear: External ear normal.  Nose: Nose normal.  Mouth/Throat: Oropharynx is clear and moist. No oropharyngeal exudate.  Eyes: Pupils are equal, round, and reactive to light. Conjunctivae and EOM are normal. Right eye exhibits no discharge. Left eye exhibits no discharge. No scleral icterus.  Patient is up-to-date on eye exams  Neck: Normal range of motion. Neck supple. No thyromegaly present.  No thyromegaly or thyroid masses anterior cervical nodes or bruits  Cardiovascular: Normal rate, regular rhythm, normal heart sounds and intact distal pulses.  No murmur heard. Heart is regular at 60/min with good pulses.  Pulmonary/Chest: Effort normal and breath sounds normal. No respiratory distress. She has no wheezes. She has no rales.  Clear anteriorly and posteriorly with a dry cough  Abdominal: Soft. Bowel sounds are normal. She exhibits no mass. There is no tenderness.  Domino obesity without masses tenderness organ enlargement or bruits  Musculoskeletal: Normal range of motion. She exhibits no edema.  Lymphadenopathy:    She has no cervical adenopathy.  Neurological: She is alert and oriented to person, place, and time. She has normal reflexes. No cranial nerve deficit or sensory deficit.  Reflexes are 2+ and equal bilaterally and patient is alert.  Skin: Skin is warm and dry. No rash noted.  Dry skin in general  Psychiatric: She has a normal mood and affect. Her behavior is normal. Judgment and thought content normal.  Mood affect and behavior for this patient are normal.  Nursing note and vitals reviewed.  BP 139/78 (BP Location: Left Arm)   Pulse 70   Temp (!) 97.5 F (36.4 C) (Oral)   Ht 5' 5"  (1.651 m)   Wt 157 lb (71.2 kg)   BMI 26.13 kg/m         Assessment & Plan:  1. Essential hypertension -Blood pressure is good and she will continue with current treatment -  BMP8+EGFR - CBC with Differential/Platelet - Hepatic function panel - amLODipine (NORVASC) 5 MG tablet; TAKE ONE (1) TABLET EACH DAY  Dispense: 90 tablet; Refill: 3  2. Vitamin D deficiency -Continue with vitamin D replacement pending results of lab work - CBC with Differential/Platelet - VITAMIN D 25 Hydroxy (Vit-D Deficiency, Fractures)  3. Pure hypercholesterolemia -Continue with cholesterol treatment which is Crestor and fenofibrate pending results of lab work - CBC with Differential/Platelet - Lipid panel  4. THYROID CANCER -Follow-up with endocrinology as planned - CBC with Differential/Platelet - Thyroid Panel With TSH  5. Metabolic syndrome -Continue to work on weight through aggressive therapeutic lifestyle changes including diet and exercise - BMP8+EGFR - CBC with Differential/Platelet  6. Atherosclerosis of native coronary artery without angina pectoris, unspecified whether native or transplanted heart -Continue statin treatment and therapeutic lifestyle changes pending results of lab work - CBC with Differential/Platelet - Lipid panel  7. Dyslipidemia -Continue current treatment and therapeutic lifestyle changes - nitroGLYCERIN (NITROSTAT) 0.4 MG SL tablet; Place 1 tablet (  0.4 mg total) under the tongue every 5 (five) minutes as needed for chest pain.  Dispense: 25 tablet; Refill: 3  8. GAD (generalized anxiety disorder) - busPIRone (BUSPAR) 30 MG tablet; Take 1 tablet (30 mg total) by mouth 2 (two) times daily.  Dispense: 180 tablet; Refill: 3 - LORazepam (ATIVAN) 1 MG tablet; Take 1 tablet (1 mg total) by mouth daily as needed for anxiety.  Dispense: 30 tablet; Refill: 5  Meds ordered this encounter  Medications  . rosuvastatin (CRESTOR) 40 MG tablet    Sig: TAKE ONE TABLET DAILY AS DIRECTED    Dispense:  90 tablet    Refill:  3  . nitroGLYCERIN (NITROSTAT) 0.4 MG SL tablet    Sig: Place 1 tablet (0.4 mg total) under the tongue every 5 (five) minutes as  needed for chest pain.    Dispense:  25 tablet    Refill:  3  . metoprolol tartrate (LOPRESSOR) 50 MG tablet    Sig: Take 1 tablet (50 mg total) by mouth 2 (two) times daily.    Dispense:  180 tablet    Refill:  3  . losartan (COZAAR) 100 MG tablet    Sig: Take 1 tablet (100 mg total) by mouth daily.    Dispense:  90 tablet    Refill:  3  . fenofibrate 160 MG tablet    Sig: TAKE ONE (1) TABLET EACH DAY    Dispense:  90 tablet    Refill:  3  . Vitamin D, Ergocalciferol, (DRISDOL) 50000 units CAPS capsule    Sig: Take 1 capsule (50,000 Units total) by mouth every 7 (seven) days.    Dispense:  12 capsule    Refill:  3  . busPIRone (BUSPAR) 30 MG tablet    Sig: Take 1 tablet (30 mg total) by mouth 2 (two) times daily.    Dispense:  180 tablet    Refill:  3  . amLODipine (NORVASC) 5 MG tablet    Sig: TAKE ONE (1) TABLET EACH DAY    Dispense:  90 tablet    Refill:  3  . LORazepam (ATIVAN) 1 MG tablet    Sig: Take 1 tablet (1 mg total) by mouth daily as needed for anxiety.    Dispense:  30 tablet    Refill:  5   Patient Instructions                       Medicare Annual Wellness Visit  [facility] and the medical providers at [facility] strive to bring you the best medical care.  In doing so we not only want to address your current medical conditions and concerns but also to detect new conditions early and prevent illness, disease and health-related problems.    Medicare offers a yearly Wellness Visit which allows our clinical staff to assess your need for preventative services including immunizations, lifestyle education, counseling to decrease risk of preventable diseases and screening for fall risk and other medical concerns.    This visit is provided free of charge (no copay) for all Medicare recipients. The clinical pharmacists at [facility] have begun to conduct these Wellness Visits which will also include a thorough review of all  your medications.    As you primary medical provider recommend that you make an appointment for your Annual Wellness Visit if you have not done so already this year.  You may set up this appointment before you leave  today or you may call back [phone]) and schedule an appointment.  Please make sure when you call that you mention that you are scheduling your Annual Wellness Visit with the clinical pharmacist so that the appointment may be made for the proper length of time.     Continue current medications. Continue good therapeutic lifestyle changes which include good diet and exercise. Fall precautions discussed with patient. If an FOBT was given today- please return it to our front desk. If you are over 66 years old - you may need Prevnar 66 or the adult Pneumonia vaccine.  **Flu shots are available--- please call and schedule a FLU-CLINIC appointment**  After your visit with [place] today you will receive a survey in the mail or online from [employer] regarding your care with [place]. Please take a moment to fill this out. Your feedback is very important to [place] as you can help [place] better understand your patient needs as well as improve your experience and satisfaction. WE CARE ABOUT YOU!!!   Follow through with gynecology opinion regarding this cystocele Follow-up with cardiology and you may need an earlier appointment if you decide to get the vaginal work done sooner.  This would be for cardiac approval because of your cardiac history. Drink plenty of water and stay well-hydrated Continue to make every effort to stop smoking We will also make arrangements for you to see the gastroenterologist because your colonoscopy is coming due as it has been almost 10 years.  Arrie Senate MD

## 2017-11-29 NOTE — Addendum Note (Signed)
Addended by: Zannie Cove on: 11/29/2017 09:59 AM   Modules accepted: Orders

## 2017-11-29 NOTE — Patient Instructions (Addendum)
Medicare Annual Wellness Visit  [facility] and the medical providers at [facility] strive to bring you the best medical care.  In doing so we not only want to address your current medical conditions and concerns but also to detect new conditions early and prevent illness, disease and health-related problems.    Medicare offers a yearly Wellness Visit which allows our clinical staff to assess your need for preventative services including immunizations, lifestyle education, counseling to decrease risk of preventable diseases and screening for fall risk and other medical concerns.    This visit is provided free of charge (no copay) for all Medicare recipients. The clinical pharmacists at [facility] have begun to conduct these Wellness Visits which will also include a thorough review of all your medications.    As you primary medical provider recommend that you make an appointment for your Annual Wellness Visit if you have not done so already this year.  You may set up this appointment before you leave today or you may call back ([phone]) and schedule an appointment.  Please make sure when you call that you mention that you are scheduling your Annual Wellness Visit with the clinical pharmacist so that the appointment may be made for the proper length of time.     Continue current medications. Continue good therapeutic lifestyle changes which include good diet and exercise. Fall precautions discussed with patient. If an FOBT was given today- please return it to our front desk. If you are over 65 years old - you may need Prevnar 26 or the adult Pneumonia vaccine.  **Flu shots are available--- please call and schedule a FLU-CLINIC appointment**  After your visit with [place] today you will receive a survey in the mail or online from [employer] regarding your care with [place]. Please take a moment to fill this out. Your feedback is very  important to [place] as you can help [place] better understand your patient needs as well as improve your experience and satisfaction. WE CARE ABOUT YOU!!!   Follow through with gynecology opinion regarding this cystocele Follow-up with cardiology and you may need an earlier appointment if you decide to get the vaginal work done sooner.  This would be for cardiac approval because of your cardiac history. Drink plenty of water and stay well-hydrated Continue to make every effort to stop smoking We will also make arrangements for you to see the gastroenterologist because your colonoscopy is coming due as it has been almost 10 years.

## 2017-11-30 LAB — BMP8+EGFR
BUN/Creatinine Ratio: 14 (ref 12–28)
BUN: 13 mg/dL (ref 8–27)
CO2: 17 mmol/L — ABNORMAL LOW (ref 20–29)
Calcium: 9.6 mg/dL (ref 8.7–10.3)
Chloride: 107 mmol/L — ABNORMAL HIGH (ref 96–106)
Creatinine, Ser: 0.92 mg/dL (ref 0.57–1.00)
GFR calc Af Amer: 72 mL/min/{1.73_m2} (ref >59)
GFR calc non Af Amer: 63 mL/min/{1.73_m2} (ref >59)
Glucose: 102 mg/dL — ABNORMAL HIGH (ref 65–99)
Potassium: 4.5 mmol/L (ref 3.5–5.2)
Sodium: 143 mmol/L (ref 134–144)

## 2017-11-30 LAB — CBC WITH DIFFERENTIAL/PLATELET
Basophils Absolute: 0.1 10*3/uL (ref 0.0–0.2)
Basos: 1 %
EOS (ABSOLUTE): 0.3 10*3/uL (ref 0.0–0.4)
Eos: 3 %
Hematocrit: 49.4 % — ABNORMAL HIGH (ref 34.0–46.6)
Hemoglobin: 16.2 g/dL — ABNORMAL HIGH (ref 11.1–15.9)
Immature Grans (Abs): 0 10*3/uL (ref 0.0–0.1)
Immature Granulocytes: 0 %
Lymphocytes Absolute: 2.6 10*3/uL (ref 0.7–3.1)
Lymphs: 22 %
MCH: 30.5 pg (ref 26.6–33.0)
MCHC: 32.8 g/dL (ref 31.5–35.7)
MCV: 93 fL (ref 79–97)
Monocytes Absolute: 0.4 10*3/uL (ref 0.1–0.9)
Monocytes: 4 %
Neutrophils Absolute: 8.4 10*3/uL — ABNORMAL HIGH (ref 1.4–7.0)
Neutrophils: 70 %
Platelets: 255 10*3/uL (ref 150–450)
RBC: 5.31 x10E6/uL — ABNORMAL HIGH (ref 3.77–5.28)
RDW: 14.1 % (ref 12.3–15.4)
WBC: 11.8 10*3/uL — ABNORMAL HIGH (ref 3.4–10.8)

## 2017-11-30 LAB — HEPATIC FUNCTION PANEL
ALT: 14 IU/L (ref 0–32)
AST: 16 IU/L (ref 0–40)
Albumin: 4.6 g/dL (ref 3.5–4.8)
Alkaline Phosphatase: 44 IU/L (ref 39–117)
Bilirubin Total: 0.3 mg/dL (ref 0.0–1.2)
Bilirubin, Direct: 0.1 mg/dL (ref 0.00–0.40)
Total Protein: 7.2 g/dL (ref 6.0–8.5)

## 2017-11-30 LAB — LIPID PANEL
Chol/HDL Ratio: 4 ratio (ref 0.0–4.4)
Cholesterol, Total: 109 mg/dL (ref 100–199)
HDL: 27 mg/dL — ABNORMAL LOW (ref >39)
LDL Calculated: 57 mg/dL (ref 0–99)
Triglycerides: 125 mg/dL (ref 0–149)
VLDL Cholesterol Cal: 25 mg/dL (ref 5–40)

## 2017-11-30 LAB — THYROID PANEL WITH TSH
Free Thyroxine Index: 4.3 (ref 1.2–4.9)
T3 Uptake Ratio: 35 % (ref 24–39)
T4, Total: 12.2 ug/dL — ABNORMAL HIGH (ref 4.5–12.0)
TSH: 1.14 u[IU]/mL (ref 0.450–4.500)

## 2017-11-30 LAB — VITAMIN D 25 HYDROXY (VIT D DEFICIENCY, FRACTURES): Vit D, 25-Hydroxy: 47.8 ng/mL (ref 30.0–100.0)

## 2017-12-02 ENCOUNTER — Other Ambulatory Visit: Payer: Medicare Other

## 2017-12-02 DIAGNOSIS — Z1211 Encounter for screening for malignant neoplasm of colon: Secondary | ICD-10-CM

## 2017-12-02 NOTE — Addendum Note (Signed)
Addended by: Liliane Bade on: 12/02/2017 03:14 PM   Modules accepted: Orders

## 2017-12-04 LAB — FECAL OCCULT BLOOD, IMMUNOCHEMICAL: Fecal Occult Bld: NEGATIVE

## 2017-12-05 ENCOUNTER — Telehealth: Payer: Self-pay | Admitting: Family Medicine

## 2017-12-05 NOTE — Telephone Encounter (Signed)
Pt called about update urology visit and GYN that she will be going to for bladder issues

## 2017-12-20 DIAGNOSIS — N8111 Cystocele, midline: Secondary | ICD-10-CM | POA: Insufficient documentation

## 2017-12-20 DIAGNOSIS — N952 Postmenopausal atrophic vaginitis: Secondary | ICD-10-CM | POA: Insufficient documentation

## 2017-12-20 DIAGNOSIS — N814 Uterovaginal prolapse, unspecified: Secondary | ICD-10-CM | POA: Insufficient documentation

## 2017-12-27 ENCOUNTER — Other Ambulatory Visit: Payer: Self-pay | Admitting: Family Medicine

## 2017-12-27 DIAGNOSIS — Z1231 Encounter for screening mammogram for malignant neoplasm of breast: Secondary | ICD-10-CM

## 2018-01-19 ENCOUNTER — Ambulatory Visit
Admission: RE | Admit: 2018-01-19 | Discharge: 2018-01-19 | Disposition: A | Discharge status: Home / Self Care | Payer: Medicare Other | Source: Ambulatory Visit | Attending: Family Medicine | Admitting: Family Medicine

## 2018-01-19 DIAGNOSIS — Z1231 Encounter for screening mammogram for malignant neoplasm of breast: Secondary | ICD-10-CM | POA: Diagnosis not present

## 2018-01-20 DIAGNOSIS — N8111 Cystocele, midline: Secondary | ICD-10-CM | POA: Diagnosis not present

## 2018-01-24 DIAGNOSIS — H43811 Vitreous degeneration, right eye: Secondary | ICD-10-CM | POA: Diagnosis not present

## 2018-01-24 DIAGNOSIS — H02051 Trichiasis without entropian right upper eyelid: Secondary | ICD-10-CM | POA: Diagnosis not present

## 2018-01-24 DIAGNOSIS — Z961 Presence of intraocular lens: Secondary | ICD-10-CM | POA: Diagnosis not present

## 2018-02-02 ENCOUNTER — Ambulatory Visit (INDEPENDENT_AMBULATORY_CARE_PROVIDER_SITE_OTHER): Payer: Medicare Other

## 2018-02-02 DIAGNOSIS — Z23 Encounter for immunization: Secondary | ICD-10-CM | POA: Diagnosis not present

## 2018-02-09 DIAGNOSIS — I2510 Atherosclerotic heart disease of native coronary artery without angina pectoris: Secondary | ICD-10-CM | POA: Diagnosis not present

## 2018-02-09 DIAGNOSIS — N952 Postmenopausal atrophic vaginitis: Secondary | ICD-10-CM | POA: Diagnosis not present

## 2018-02-09 DIAGNOSIS — N8111 Cystocele, midline: Secondary | ICD-10-CM | POA: Diagnosis not present

## 2018-02-13 ENCOUNTER — Encounter: Payer: Self-pay | Admitting: Gastroenterology

## 2018-03-06 NOTE — Progress Notes (Signed)
HPI The patient returns for followup of CAD.  I saw her for back pain last year.  She had a negative Lexiscan Myoview and normal echo.   She says she thinks that was related to a panic attack.  She has not had any symptoms since then.  She denies any chest pressure, neck or arm discomfort.  She is not having any palpitations, presyncope or syncope.  She has had no new shortness of breath, PND or orthopnea.  She does yard work and is right now getting up leaves.  Allergies  Allergen Reactions  . Banana     Throat and ears itch,ears feel like they swell  . Bupropion Hcl     REACTION: paranoia  . Chantix [Varenicline]     paranoia  . Latex Rash  . Zetia [Ezetimibe]     Myalgia    Current Outpatient Medications  Medication Sig Dispense Refill  . amLODipine (NORVASC) 5 MG tablet TAKE ONE (1) TABLET EACH DAY 90 tablet 3  . busPIRone (BUSPAR) 30 MG tablet Take 1 tablet (30 mg total) by mouth 2 (two) times daily. 180 tablet 3  . conjugated estrogens (PREMARIN) vaginal cream 1 application 3 times weekly 42.5 g 12  . fenofibrate 160 MG tablet TAKE ONE (1) TABLET EACH DAY 90 tablet 3  . LORazepam (ATIVAN) 1 MG tablet Take 1 tablet (1 mg total) by mouth daily as needed for anxiety. 30 tablet 5  . losartan (COZAAR) 100 MG tablet Take 1 tablet (100 mg total) by mouth daily. 90 tablet 3  . Melatonin 5 MG TABS Take by mouth as needed.    . metoprolol tartrate (LOPRESSOR) 50 MG tablet Take 0.5 tablets (25 mg total) by mouth 2 (two) times daily. 90 tablet 3  . nitroGLYCERIN (NITROSTAT) 0.4 MG SL tablet Place 1 tablet (0.4 mg total) under the tongue every 5 (five) minutes as needed for chest pain. 25 tablet 3  . rosuvastatin (CRESTOR) 40 MG tablet TAKE ONE TABLET DAILY AS DIRECTED 90 tablet 3  . SYNTHROID 137 MCG tablet Take 1 tablet (137 mcg total) by mouth daily before breakfast. TAKE 1 TABLET DAILY 34 tablet 6  . Vitamin D, Ergocalciferol, (DRISDOL) 50000 units CAPS capsule Take 1 capsule  (50,000 Units total) by mouth every 7 (seven) days. 12 capsule 3  . aspirin EC 81 MG tablet Take 1 tablet (81 mg total) by mouth daily.     No current facility-administered medications for this visit.     Past Medical History:  Diagnosis Date  . CAD (coronary artery disease)    PCI of an occluded right coronary artery 2001. 70% LAD stenosis, 30% circumflex stenosis.  . Diverticulosis   . Hyperlipidemia   . Hypertension   . IBS (irritable bowel syndrome)   . Leukocytosis, unspecified 07/24/2012  . Myocardial infarction ([place]) 2001  . Need for prophylactic hormone replacement therapy (postmenopausal)   . Osteopenia   . Osteoporosis   . Thyroid cancer [facility]) 2004   Dr. Clovia Cuff   . Thyroid nodule 2004  . Tobacco abuse     Past Surgical History:  Procedure Laterality Date  . APPENDECTOMY  1955  . BACK SURGERY  06/2000  . CHOLECYSTECTOMY  07/2002  . EYE SURGERY Left 05/16/2017   cataract  . LIPOMA EXCISION Right 08/2003   r arm   . THYROIDECTOMY Bilateral 02/2003    ROS: Positive for none. Otherwise as stated in the HPI and negative for all other systems.  PHYSICAL EXAM BP (!) 154/79   Pulse 64   Ht 5' 5.5" (1.664 m)   Wt 157 lb 6.4 oz (71.4 kg)   SpO2 95%   BMI 25.79 kg/m   GENERAL:  Well appearing NECK:  No jugular venous distention, waveform within normal limits, carotid upstroke brisk and symmetric, no bruits, no thyromegaly LUNGS:  Clear to auscultation bilaterally CHEST:  Unremarkable HEART:  PMI not displaced or sustained,S1 and S2 within normal limits, no S3, no S4, no clicks, no rubs, no murmurs ABD:  Flat, positive bowel sounds normal in frequency in pitch, no bruits, no rebound, no guarding, no midline pulsatile mass, no hepatomegaly, no splenomegaly EXT:  2 plus pulses throughout, no edema, no cyanosis no clubbing   EKG:  NSR, rate 61 , RBBB, no acute ST-T wave changes.  03/08/2018  Lab Results  Component Value Date   CHOL 109 11/29/2017   TRIG 125  11/29/2017   HDL 27 (L) 11/29/2017   LDLCALC 57 11/29/2017    ASSESSMENT AND PLAN  CORONARY ARTERY DISEASE -  The patient has no new sypmtoms.  No further cardiovascular testing is indicated.  We will continue with aggressive risk reduction and meds as listed.  RBBB:      This is not changed.  No further work up.   TOBACCO ABUSE -  We talked about this again and she is trying to cut back.   DYSLIPIDEMIA -  LDL is excellent continue current therapy.   HTN - He is at target at home all the time.  She very much wants to reduce her medicines and wants to go back down to 25 mg twice a day of metoprolol and as long as her pressure is okay at home I think this is fine.

## 2018-03-08 ENCOUNTER — Ambulatory Visit: Payer: Medicare Other | Admitting: Cardiology

## 2018-03-08 ENCOUNTER — Encounter: Payer: Self-pay | Admitting: Cardiology

## 2018-03-08 VITALS — BP 154/79 | HR 64 | Ht 65.5 in | Wt 157.4 lb

## 2018-03-08 DIAGNOSIS — E785 Hyperlipidemia, unspecified: Secondary | ICD-10-CM | POA: Diagnosis not present

## 2018-03-08 DIAGNOSIS — Z720 Tobacco use: Secondary | ICD-10-CM

## 2018-03-08 DIAGNOSIS — I2510 Atherosclerotic heart disease of native coronary artery without angina pectoris: Secondary | ICD-10-CM | POA: Diagnosis not present

## 2018-03-08 DIAGNOSIS — I10 Essential (primary) hypertension: Secondary | ICD-10-CM

## 2018-03-08 MED ORDER — METOPROLOL TARTRATE 50 MG PO TABS: 25.0000 mg | ORAL_TABLET | Freq: Two times a day (BID) | ORAL | 3 refills | Status: DC

## 2018-03-08 MED ORDER — ASPIRIN EC 81 MG PO TBEC: 81.0000 mg | DELAYED_RELEASE_TABLET | Freq: Every day | ORAL | Status: DC

## 2018-03-08 NOTE — Patient Instructions (Addendum)
Medication Instructions:  Please decrease your Aspirin to 81 mg daily. Decrease your Metoprolol to 25 mg twice a day. (50 mg 1/2 tablet) Continue all other medications as listed.  If you need a refill on your cardiac medications before your next appointment, please call your pharmacy.   Follow-Up: . Follow up in 1 year with Dr. Percival Spanish in [place].  You will receive a letter in the mail 2 months before you are due.  Please call us when you receive this letter to schedule your follow up appointment.   Thank you for choosing [facility]!!

## 2018-03-27 ENCOUNTER — Encounter: Payer: Self-pay | Admitting: Family Medicine

## 2018-05-11 DIAGNOSIS — Z79899 Other long term (current) drug therapy: Secondary | ICD-10-CM | POA: Diagnosis not present

## 2018-05-11 DIAGNOSIS — C73 Malignant neoplasm of thyroid gland: Secondary | ICD-10-CM | POA: Diagnosis not present

## 2018-05-11 DIAGNOSIS — E890 Postprocedural hypothyroidism: Secondary | ICD-10-CM | POA: Diagnosis not present

## 2018-05-26 ENCOUNTER — Other Ambulatory Visit: Payer: Self-pay | Admitting: Physician Assistant

## 2018-05-30 ENCOUNTER — Encounter: Payer: Self-pay | Admitting: Family Medicine

## 2018-05-30 ENCOUNTER — Ambulatory Visit (INDEPENDENT_AMBULATORY_CARE_PROVIDER_SITE_OTHER): Payer: Medicare Other | Admitting: Family Medicine

## 2018-05-30 VITALS — BP 152/90 | HR 69 | Temp 97.3°F | Ht 65.5 in | Wt 159.0 lb

## 2018-05-30 DIAGNOSIS — E559 Vitamin D deficiency, unspecified: Secondary | ICD-10-CM | POA: Diagnosis not present

## 2018-05-30 DIAGNOSIS — I2510 Atherosclerotic heart disease of native coronary artery without angina pectoris: Secondary | ICD-10-CM

## 2018-05-30 DIAGNOSIS — I10 Essential (primary) hypertension: Secondary | ICD-10-CM | POA: Diagnosis not present

## 2018-05-30 DIAGNOSIS — N814 Uterovaginal prolapse, unspecified: Secondary | ICD-10-CM

## 2018-05-30 DIAGNOSIS — F411 Generalized anxiety disorder: Secondary | ICD-10-CM

## 2018-05-30 DIAGNOSIS — C73 Malignant neoplasm of thyroid gland: Secondary | ICD-10-CM

## 2018-05-30 DIAGNOSIS — E7800 Pure hypercholesterolemia, unspecified: Secondary | ICD-10-CM

## 2018-05-30 DIAGNOSIS — Z1211 Encounter for screening for malignant neoplasm of colon: Secondary | ICD-10-CM

## 2018-05-30 DIAGNOSIS — E8881 Metabolic syndrome: Secondary | ICD-10-CM

## 2018-05-30 MED ORDER — FENOFIBRATE 160 MG PO TABS: ORAL_TABLET | ORAL | 3 refills | Status: DC

## 2018-05-30 MED ORDER — LORAZEPAM 1 MG PO TABS: 1.0000 mg | ORAL_TABLET | Freq: Every day | ORAL | 5 refills | Status: DC | PRN

## 2018-05-30 MED ORDER — BUSPIRONE HCL 30 MG PO TABS: 30.0000 mg | ORAL_TABLET | Freq: Two times a day (BID) | ORAL | 3 refills | Status: DC

## 2018-05-30 MED ORDER — METOPROLOL TARTRATE 50 MG PO TABS: 25.0000 mg | ORAL_TABLET | Freq: Two times a day (BID) | ORAL | 3 refills | Status: DC

## 2018-05-30 MED ORDER — AMLODIPINE BESYLATE 5 MG PO TABS: ORAL_TABLET | ORAL | 3 refills | Status: DC

## 2018-05-30 MED ORDER — ROSUVASTATIN CALCIUM 40 MG PO TABS: ORAL_TABLET | ORAL | 3 refills | Status: DC

## 2018-05-30 MED ORDER — VITAMIN D (ERGOCALCIFEROL) 1.25 MG (50000 UNIT) PO CAPS: 50000.0000 [IU] | ORAL_CAPSULE | ORAL | 3 refills | Status: DC

## 2018-05-30 MED ORDER — ESTROGENS, CONJUGATED 0.625 MG/GM VA CREA: TOPICAL_CREAM | VAGINAL | 12 refills | Status: DC

## 2018-05-30 MED ORDER — LOSARTAN POTASSIUM 100 MG PO TABS: 100.0000 mg | ORAL_TABLET | Freq: Every day | ORAL | 3 refills | Status: DC

## 2018-05-30 NOTE — Progress Notes (Signed)
Subjective:    Patient ID: Madison [name], female    DOB: 01/07/1946, 73 y.o.   MRN: [id number]  HPI Pt here for follow up and management of chronic medical problems which includes hypertension and hyperlipidemia. She is taking medication regularly.  The patient is doing well overall.  She is followed regularly by the cardiologist and also by the endocrinologist at [facility] for her thyroid cancer.  She is requesting refills on all of her medicines today.  This is also including her Lorazepam.  Her weight is up a couple pounds.  Her blood pressure slightly elevated on the initial check.  The patient is taking amlodipine pain and metoprolol and losartan for blood pressure and heart.  She is on Crestor for her cholesterol and is on 137 mcg of Synthroid daily.  She is also on fenofibrate.  The patient is pleasant and doing well and unfortunately still smoking.  She denies any chest pain pressure tightness or shortness of breath.  She denies any trouble with her stomach other than some occasional heartburn which is relieved with Tums.  She denies any nausea vomiting diarrhea blood in the stool or black tarry bowel movements.  She is passing her water well.  She sees the cardiologist yearly in November and sees the endocrinologist yearly in January.  She says that her blood pressures at home typically run between 120 and 130/70-80 range.  A repeat blood pressure here today was 152/90 and the right arm.    Patient Active Problem List   Diagnosis Date Noted  . Coronary artery disease involving native coronary artery of native heart without angina pectoris 03/08/2018  . Dyslipidemia 03/08/2018  . GAD (generalized anxiety disorder) 05/19/2017  . Chest pain 02/23/2017  . RBBB 02/23/2017  . Metabolic syndrome 62/70/3500  . Leukocytosis 01/04/2013  . TOBACCO ABUSE 10/29/2009  . THYROID CANCER 11/30/2007  . Hyperlipidemia 11/30/2007  . HTN (hypertension) 11/30/2007  .  Coronary atherosclerosis 11/30/2007  . IRRITABLE BOWEL SYNDROME 11/30/2007  . Osteopenia 11/30/2007   Outpatient Encounter Medications as of 05/30/2018  Medication Sig  . amLODipine (NORVASC) 5 MG tablet TAKE ONE (1) TABLET EACH DAY  . aspirin EC 81 MG tablet Take 1 tablet (81 mg total) by mouth daily.  . busPIRone (BUSPAR) 30 MG tablet Take 1 tablet (30 mg total) by mouth 2 (two) times daily.  Marland Kitchen conjugated estrogens (PREMARIN) vaginal cream 1 application 3 times weekly  . fenofibrate 160 MG tablet TAKE ONE (1) TABLET EACH DAY  . LORazepam (ATIVAN) 1 MG tablet Take 1 tablet (1 mg total) by mouth daily as needed for anxiety.  Marland Kitchen losartan (COZAAR) 100 MG tablet Take 1 tablet (100 mg total) by mouth daily.  . Melatonin 5 MG TABS Take by mouth as needed.  . metoprolol tartrate (LOPRESSOR) 50 MG tablet Take 0.5 tablets (25 mg total) by mouth 2 (two) times daily.  . nitroGLYCERIN (NITROSTAT) 0.4 MG SL tablet Place 1 tablet (0.4 mg total) under the tongue every 5 (five) minutes as needed for chest pain.  . rosuvastatin (CRESTOR) 40 MG tablet TAKE ONE TABLET DAILY AS DIRECTED  . SYNTHROID 137 MCG tablet Take 1 tablet (137 mcg total) by mouth daily before breakfast. TAKE 1 TABLET DAILY  . Vitamin D, Ergocalciferol, (DRISDOL) 50000 units CAPS capsule Take 1 capsule (50,000 Units total) by mouth every 7 (seven) days.   No facility-administered encounter medications on file as of 05/30/2018.  Review of Systems  Constitutional: Negative.   HENT: Negative.   Eyes: Negative.   Respiratory: Negative.   Cardiovascular: Negative.   Gastrointestinal: Negative.   Endocrine: Negative.   Genitourinary: Negative.   Musculoskeletal: Negative.   Skin: Negative.   Allergic/Immunologic: Negative.   Neurological: Negative.   Hematological: Negative.   Psychiatric/Behavioral: Negative.        Objective:   Physical Exam Vitals signs and nursing note reviewed.  Constitutional:      General: She is  not in acute distress.    Appearance: Normal appearance. She is well-developed and normal weight.  HENT:     Head: Normocephalic.     Right Ear: Tympanic membrane, ear canal and external ear normal. There is no impacted cerumen.     Left Ear: Tympanic membrane and external ear normal. There is no impacted cerumen.     Nose: Nose normal. No congestion.     Mouth/Throat:     Mouth: Mucous membranes are moist.     Pharynx: Oropharynx is clear. No oropharyngeal exudate.  Eyes:     General: No scleral icterus.       Right eye: No discharge.        Left eye: No discharge.     Extraocular Movements: Extraocular movements intact.     Conjunctiva/sclera: Conjunctivae normal.     Pupils: Pupils are equal, round, and reactive to light.  Neck:     Musculoskeletal: Normal range of motion and neck supple.     Thyroid: No thyromegaly.     Vascular: No carotid bruit or JVD.     Comments: No bruits thyromegaly or anterior cervical adenopathy Cardiovascular:     Rate and Rhythm: Normal rate and regular rhythm.     Pulses: Normal pulses.     Heart sounds: Normal heart sounds. No murmur. No gallop.      Comments: The heart is regular at 72/min with good pedal pulses and no edema Pulmonary:     Effort: Pulmonary effort is normal. No respiratory distress.     Breath sounds: Wheezing present. No rales.     Comments: Rare wheeze and slightly diminished breath sounds and slightly tight with coughing. Abdominal:     General: Abdomen is flat. Bowel sounds are normal.     Palpations: Abdomen is soft. There is no mass.     Tenderness: There is no abdominal tenderness. There is no guarding.  Musculoskeletal: Normal range of motion.        General: No swelling or tenderness.     Right lower leg: No edema.     Left lower leg: No edema.  Lymphadenopathy:     Cervical: No cervical adenopathy.  Skin:    General: Skin is warm and dry.     Findings: No rash.  Neurological:     General: No focal deficit  present.     Mental Status: She is alert and oriented to person, place, and time. Mental status is at baseline.     Cranial Nerves: No cranial nerve deficit.     Gait: Gait normal.     Deep Tendon Reflexes: Reflexes are normal and symmetric. Reflexes normal.  Psychiatric:        Mood and Affect: Mood normal.        Behavior: Behavior normal.        Thought Content: Thought content normal.        Judgment: Judgment normal.     Comments: Mood affect and behavior are normal  for this patient    BP (!) 142/74 (BP Location: Left Arm)   Pulse 69   Temp (!) 97.3 F (36.3 C) (Oral)   Ht 5' 5.5" (1.664 m)   Wt 159 lb (72.1 kg)   BMI 26.06 kg/m         Assessment & Plan:  1. Essential hypertension -Blood pressure was slightly elevated today.  Patient says it readings at home are better consistently.  We will not make any changes in medication and she will continue to watch her diet closely and make every effort to reduce the sodium in her diet. - BMP8+EGFR - CBC with Differential/Platelet - Hepatic function panel - amLODipine (NORVASC) 5 MG tablet; TAKE ONE (1) TABLET EACH DAY  Dispense: 90 tablet; Refill: 3  2. Vitamin D deficiency -Continue with vitamin D replacement pending results of lab work - CBC with Differential/Platelet - VITAMIN D 25 Hydroxy (Vit-D Deficiency, Fractures)  3. Pure hypercholesterolemia -Continue with statin therapy and fenofibrate pending results of lab work - CBC with Differential/Platelet - Lipid panel  4. THYROID CANCER -Follow-up with endocrinologist at [facility] as planned - CBC with Differential/Platelet  5. Metabolic syndrome -Continue to work on weight through diet and exercise - BMP8+EGFR - CBC with Differential/Platelet  6. Atherosclerosis of native coronary artery without angina pectoris, unspecified whether native or transplanted heart -Follow-up with cardiology as planned and continue with aggressive therapeutic lifestyle changes aimed at  losing weight with diet and exercise.  Also continue to make every effort to stop smoking. - CBC with Differential/Platelet - Lipid panel  7. Cystocele with prolapse -Follow-up with GYN - conjugated estrogens (PREMARIN) vaginal cream; 1 application 3 times weekly  Dispense: 42.5 g; Refill: 12  8. GAD (generalized anxiety disorder) - busPIRone (BUSPAR) 30 MG tablet; Take 1 tablet (30 mg total) by mouth 2 (two) times daily.  Dispense: 180 tablet; Refill: 3 - LORazepam (ATIVAN) 1 MG tablet; Take 1 tablet (1 mg total) by mouth daily as needed for anxiety.  Dispense: 30 tablet; Refill: 5  Meds ordered this encounter  Medications  . conjugated estrogens (PREMARIN) vaginal cream    Sig: 1 application 3 times weekly    Dispense:  42.5 g    Refill:  12  . amLODipine (NORVASC) 5 MG tablet    Sig: TAKE ONE (1) TABLET EACH DAY    Dispense:  90 tablet    Refill:  3  . busPIRone (BUSPAR) 30 MG tablet    Sig: Take 1 tablet (30 mg total) by mouth 2 (two) times daily.    Dispense:  180 tablet    Refill:  3  . Vitamin D, Ergocalciferol, (DRISDOL) 1.25 MG (50000 UT) CAPS capsule    Sig: Take 1 capsule (50,000 Units total) by mouth every 7 (seven) days.    Dispense:  12 capsule    Refill:  3  . rosuvastatin (CRESTOR) 40 MG tablet    Sig: TAKE ONE TABLET DAILY AS DIRECTED    Dispense:  90 tablet    Refill:  3  . metoprolol tartrate (LOPRESSOR) 50 MG tablet    Sig: Take 0.5 tablets (25 mg total) by mouth 2 (two) times daily.    Dispense:  90 tablet    Refill:  3    Pt will call to refill  . losartan (COZAAR) 100 MG tablet    Sig: Take 1 tablet (100 mg total) by mouth daily.    Dispense:  90 tablet    Refill:  3  . fenofibrate 160 MG tablet    Sig: TAKE ONE (1) TABLET EACH DAY    Dispense:  90 tablet    Refill:  3  . LORazepam (ATIVAN) 1 MG tablet    Sig: Take 1 tablet (1 mg total) by mouth daily as needed for anxiety.    Dispense:  30 tablet    Refill:  5   Patient Instructions                        Medicare Annual Wellness Visit  [facility] and the medical providers at [facility] strive to bring you the best medical care.  In doing so we not only want to address your current medical conditions and concerns but also to detect new conditions early and prevent illness, disease and health-related problems.    Medicare offers a yearly Wellness Visit which allows our clinical staff to assess your need for preventative services including immunizations, lifestyle education, counseling to decrease risk of preventable diseases and screening for fall risk and other medical concerns.    This visit is provided free of charge (no copay) for all Medicare recipients. The clinical pharmacists at [facility] have begun to conduct these Wellness Visits which will also include a thorough review of all your medications.    As you primary medical provider recommend that you make an appointment for your Annual Wellness Visit if you have not done so already this year.  You may set up this appointment before you leave today or you may call back ([phone]) and schedule an appointment.  Please make sure when you call that you mention that you are scheduling your Annual Wellness Visit with the clinical pharmacist so that the appointment may be made for the proper length of time.     Continue current medications. Continue good therapeutic lifestyle changes which include good diet and exercise. Fall precautions discussed with patient. If an FOBT was given today- please return it to our front desk. If you are over 30 years old - you may need Prevnar 38 or the adult Pneumonia vaccine.  **Flu shots are available--- please call and schedule a FLU-CLINIC appointment**  After your visit with [place] today you will receive a survey in the mail or online from [employer] regarding your care with [place]. Please take a moment to fill this out. Your feedback is very important to  [place] as you can help [place] better understand your patient needs as well as improve your experience and satisfaction. WE CARE ABOUT YOU!!!   Continue to monitor blood pressures at home Continue to drink plenty of water and fluids Monitor sodium intake and reduce if possible Continue to try to make every effort to stop smoking completely Follow-up with cardiology and endocrinology as planned We will arrange for you to meet with a gastroenterologist so that you can please get a colonoscopy done by early summer.   Arrie Senate MD

## 2018-05-30 NOTE — Patient Instructions (Addendum)
Medicare Annual Wellness Visit  [facility] and the medical providers at [facility] strive to bring you the best medical care.  In doing so we not only want to address your current medical conditions and concerns but also to detect new conditions early and prevent illness, disease and health-related problems.    Medicare offers a yearly Wellness Visit which allows our clinical staff to assess your need for preventative services including immunizations, lifestyle education, counseling to decrease risk of preventable diseases and screening for fall risk and other medical concerns.    This visit is provided free of charge (no copay) for all Medicare recipients. The clinical pharmacists at [facility] have begun to conduct these Wellness Visits which will also include a thorough review of all your medications.    As you primary medical provider recommend that you make an appointment for your Annual Wellness Visit if you have not done so already this year.  You may set up this appointment before you leave today or you may call back ([phone]) and schedule an appointment.  Please make sure when you call that you mention that you are scheduling your Annual Wellness Visit with the clinical pharmacist so that the appointment may be made for the proper length of time.     Continue current medications. Continue good therapeutic lifestyle changes which include good diet and exercise. Fall precautions discussed with patient. If an FOBT was given today- please return it to our front desk. If you are over 64 years old - you may need Prevnar 29 or the adult Pneumonia vaccine.  **Flu shots are available--- please call and schedule a FLU-CLINIC appointment**  After your visit with [place] today you will receive a survey in the mail or online from [employer] regarding your care with [place]. Please take a moment to fill this out. Your feedback is very  important to [place] as you can help [place] better understand your patient needs as well as improve your experience and satisfaction. WE CARE ABOUT YOU!!!   Continue to monitor blood pressures at home Continue to drink plenty of water and fluids Monitor sodium intake and reduce if possible Continue to try to make every effort to stop smoking completely Follow-up with cardiology and endocrinology as planned We will arrange for you to meet with a gastroenterologist so that you can please get a colonoscopy done by early summer.

## 2018-05-30 NOTE — Addendum Note (Signed)
Addended by: Zannie Cove on: 05/30/2018 09:55 AM   Modules accepted: Orders

## 2018-05-31 LAB — BMP8+EGFR
BUN/Creatinine Ratio: 21 (ref 12–28)
BUN: 18 mg/dL (ref 8–27)
CO2: 19 mmol/L — ABNORMAL LOW (ref 20–29)
Calcium: 9.6 mg/dL (ref 8.7–10.3)
Chloride: 105 mmol/L (ref 96–106)
Creatinine, Ser: 0.87 mg/dL (ref 0.57–1.00)
GFR calc Af Amer: 78 mL/min/{1.73_m2} (ref >59)
GFR calc non Af Amer: 67 mL/min/{1.73_m2} (ref >59)
Glucose: 90 mg/dL (ref 65–99)
Potassium: 4.4 mmol/L (ref 3.5–5.2)
Sodium: 142 mmol/L (ref 134–144)

## 2018-05-31 LAB — CBC WITH DIFFERENTIAL/PLATELET
Basophils Absolute: 0.1 10*3/uL (ref 0.0–0.2)
Basos: 1 %
EOS (ABSOLUTE): 0.5 10*3/uL — ABNORMAL HIGH (ref 0.0–0.4)
Eos: 4 %
Hematocrit: 46.8 % — ABNORMAL HIGH (ref 34.0–46.6)
Hemoglobin: 15.8 g/dL (ref 11.1–15.9)
Immature Grans (Abs): 0 10*3/uL (ref 0.0–0.1)
Immature Granulocytes: 0 %
Lymphocytes Absolute: 3.3 10*3/uL — ABNORMAL HIGH (ref 0.7–3.1)
Lymphs: 29 %
MCH: 30.2 pg (ref 26.6–33.0)
MCHC: 33.8 g/dL (ref 31.5–35.7)
MCV: 90 fL (ref 79–97)
Monocytes Absolute: 0.7 10*3/uL (ref 0.1–0.9)
Monocytes: 6 %
Neutrophils Absolute: 6.8 10*3/uL (ref 1.4–7.0)
Neutrophils: 60 %
Platelets: 256 10*3/uL (ref 150–450)
RBC: 5.23 x10E6/uL (ref 3.77–5.28)
RDW: 12.4 % (ref 11.7–15.4)
WBC: 11.3 10*3/uL — ABNORMAL HIGH (ref 3.4–10.8)

## 2018-05-31 LAB — LIPID PANEL
Chol/HDL Ratio: 4.4 ratio (ref 0.0–4.4)
Cholesterol, Total: 106 mg/dL (ref 100–199)
HDL: 24 mg/dL — ABNORMAL LOW (ref >39)
LDL Calculated: 55 mg/dL (ref 0–99)
Triglycerides: 136 mg/dL (ref 0–149)
VLDL Cholesterol Cal: 27 mg/dL (ref 5–40)

## 2018-05-31 LAB — HEPATIC FUNCTION PANEL
ALT: 20 IU/L (ref 0–32)
AST: 19 IU/L (ref 0–40)
Albumin: 4.5 g/dL (ref 3.7–4.7)
Alkaline Phosphatase: 53 IU/L (ref 39–117)
Bilirubin Total: 0.3 mg/dL (ref 0.0–1.2)
Bilirubin, Direct: 0.1 mg/dL (ref 0.00–0.40)
Total Protein: 7.1 g/dL (ref 6.0–8.5)

## 2018-05-31 LAB — VITAMIN D 25 HYDROXY (VIT D DEFICIENCY, FRACTURES): Vit D, 25-Hydroxy: 56.3 ng/mL (ref 30.0–100.0)

## 2018-10-04 ENCOUNTER — Telehealth: Payer: Self-pay | Admitting: Family Medicine

## 2018-10-04 NOTE — Telephone Encounter (Signed)
PT has an apt in August with Dr. Laurance Flatten and wants to know who he recommends she see

## 2018-10-04 NOTE — Telephone Encounter (Signed)
Pt called

## 2018-10-19 DIAGNOSIS — N952 Postmenopausal atrophic vaginitis: Secondary | ICD-10-CM | POA: Diagnosis not present

## 2018-10-19 DIAGNOSIS — N8111 Cystocele, midline: Secondary | ICD-10-CM | POA: Diagnosis not present

## 2018-11-28 ENCOUNTER — Ambulatory Visit: Payer: Medicare Other | Admitting: Family Medicine

## 2018-12-01 ENCOUNTER — Ambulatory Visit: Payer: Medicare Other | Admitting: Family Medicine

## 2018-12-01 ENCOUNTER — Other Ambulatory Visit: Payer: Self-pay | Admitting: Physician Assistant

## 2018-12-12 ENCOUNTER — Other Ambulatory Visit: Payer: Self-pay

## 2018-12-13 ENCOUNTER — Ambulatory Visit (INDEPENDENT_AMBULATORY_CARE_PROVIDER_SITE_OTHER): Payer: Medicare Other | Admitting: Family Medicine

## 2018-12-13 ENCOUNTER — Encounter: Payer: Self-pay | Admitting: Family Medicine

## 2018-12-13 VITALS — BP 140/86 | HR 70 | Temp 97.4°F | Ht 65.5 in | Wt 158.0 lb

## 2018-12-13 DIAGNOSIS — E039 Hypothyroidism, unspecified: Secondary | ICD-10-CM | POA: Diagnosis not present

## 2018-12-13 DIAGNOSIS — F411 Generalized anxiety disorder: Secondary | ICD-10-CM | POA: Diagnosis not present

## 2018-12-13 DIAGNOSIS — Z79899 Other long term (current) drug therapy: Secondary | ICD-10-CM | POA: Diagnosis not present

## 2018-12-13 DIAGNOSIS — I10 Essential (primary) hypertension: Secondary | ICD-10-CM | POA: Diagnosis not present

## 2018-12-13 DIAGNOSIS — E7800 Pure hypercholesterolemia, unspecified: Secondary | ICD-10-CM

## 2018-12-13 MED ORDER — LORAZEPAM 1 MG PO TABS: 1.0000 mg | ORAL_TABLET | Freq: Every day | ORAL | 1 refills | Status: DC | PRN

## 2018-12-13 NOTE — Patient Instructions (Addendum)
It was a pleasure seeing you today, Madison [name].  Information regarding what we discussed is included in this packet.  Please make an appointment to see me in 6 months.  Flu shots will be available in October.  Please schedule your flu shot.  You had labs performed today.  You will be contacted with the results of the labs once they are available, usually in the next 3 business days for routine lab work.  If you have an active my chart account, they will be released to your MyChart.  If you prefer to have these labs released to you via telephone, please let us know.  If you had a pap smear or biopsy performed, expect to be contacted in about 7-10 days.  Your refills should be good until 05/2019.  I have renewed the Ativan for as needed use.   Controlled Substance Guidelines:  1. You cannot get an early refill, even it is lost.  2. You cannot get controlled medications from any other doctor, unless it is the emergency department and related to a new problem or injury.  3. You cannot use alcohol, marijuana, cocaine or any other recreational drugs while using this medication. This is very dangerous.  4. You are willing to have your urine drug tested at each visit.  5. You will not drive while using this medication, because that can put yourself and others in serious danger of an accident. 6. If any medication is stolen, then there must be a police report to verify it, or it cannot be refilled.  7. I will not prescribe these medications for longer than 3 months.  8. You must bring your pill bottle to each visit.  9. You must use the same pharmacy for all refills for the medication, unless you clear it with me beforehand.  10. You cannot share or sell this medication.

## 2018-12-13 NOTE — Progress Notes (Signed)
Subjective: CC: est care, hypothyroidism PCP: [name], DO [id number] Madison [name] is a 73 y.o. female presenting to clinic today for:  1. Hypothyroidism History: Onset after resection of her thyroid cancer about 19 years ago She reports compliance with Synthroid 137 mcg daily.  Denies any change in voice, difficulty swallowing, change in bowel habits (has IBS mixed at baseline), tremor or heart palpitations  2. GAD Mostly controlled with buspirone 30 mg twice daily.  She does have history of panic and therefore was prescribed Ativan.  She reports very rare use of this medicine and only uses it perhaps once every couple of months.  Her current bottle is expired and she does need a renewal.  Denies excessive sedation, falls or mental status changes.  3. Hypertension/ HLD Reports compliance with amlodipine 5 mg daily, losartan 100 mg daily, metoprolol 50 mg twice daily, Crestor 40 mg daily and fenofibrate 160 mg daily.  BPs run [id number] to [id number] systolic over Q000111Q to [id number] diastolic at home.  She does have a history of MI that occurred shortly after her thyroidectomy 19 years ago.  She sees Dr Percival Spanish for cardiology.  Denies any chest pain, shortness of breath.   ROS: Per HPI  Allergies  Allergen Reactions  . Banana     Throat and ears itch,ears feel like they swell  . Bupropion Hcl     REACTION: paranoia  . Chantix [Varenicline]     paranoia  . Latex Rash  . Zetia [Ezetimibe]     Myalgia   Past Medical History:  Diagnosis Date  . CAD (coronary artery disease)    PCI of an occluded right coronary artery 2001. 70% LAD stenosis, 30% circumflex stenosis.  . Diverticulosis   . Hyperlipidemia   . Hypertension   . IBS (irritable bowel syndrome)   . Leukocytosis, unspecified 07/24/2012  . Myocardial infarction ([place]) 2001  . Need for prophylactic hormone replacement therapy (postmenopausal)   . Osteopenia   . Osteoporosis   . Thyroid cancer [facility]) 2004   Dr. Clovia Cuff   . Thyroid  nodule 2004  . Tobacco abuse     Current Outpatient Medications:  .  amLODipine (NORVASC) 5 MG tablet, TAKE ONE (1) TABLET EACH DAY, Disp: 90 tablet, Rfl: 3 .  aspirin EC 81 MG tablet, Take 1 tablet (81 mg total) by mouth daily., Disp: , Rfl:  .  busPIRone (BUSPAR) 30 MG tablet, Take 1 tablet (30 mg total) by mouth 2 (two) times daily., Disp: 180 tablet, Rfl: 3 .  conjugated estrogens (PREMARIN) vaginal cream, 1 application 3 times weekly, Disp: 42.5 g, Rfl: 12 .  fenofibrate 160 MG tablet, TAKE ONE (1) TABLET EACH DAY, Disp: 90 tablet, Rfl: 3 .  LORazepam (ATIVAN) 1 MG tablet, Take 1 tablet (1 mg total) by mouth daily as needed for anxiety., Disp: 30 tablet, Rfl: 5 .  losartan (COZAAR) 100 MG tablet, Take 1 tablet (100 mg total) by mouth daily., Disp: 90 tablet, Rfl: 3 .  Melatonin 5 MG TABS, Take by mouth as needed., Disp: , Rfl:  .  metoprolol tartrate (LOPRESSOR) 50 MG tablet, Take 0.5 tablets (25 mg total) by mouth 2 (two) times daily., Disp: 90 tablet, Rfl: 3 .  nitroGLYCERIN (NITROSTAT) 0.4 MG SL tablet, Place 1 tablet (0.4 mg total) under the tongue every 5 (five) minutes as needed for chest pain., Disp: 25 tablet, Rfl: 3 .  rosuvastatin (CRESTOR) 40 MG tablet, TAKE ONE TABLET DAILY AS DIRECTED, Disp: 90 tablet,  Rfl: 3 .  SYNTHROID 137 MCG tablet, Take 1 tablet (137 mcg total) by mouth daily before breakfast. TAKE 1 TABLET DAILY, Disp: 34 tablet, Rfl: 6 .  Vitamin D, Ergocalciferol, (DRISDOL) 1.25 MG (50000 UT) CAPS capsule, Take 1 capsule (50,000 Units total) by mouth every 7 (seven) days., Disp: 12 capsule, Rfl: 3 Social History   Socioeconomic History  . Marital status: Married    Spouse name: Not on file  . Number of children: 2  . Years of education: Not on file  . Highest education level: Not on file  Occupational History  . Occupation: Retired     Comment: Engineering geologist  Social Needs  . Financial resource strain: Not on file  . Food insecurity    Worry: Not on file     Inability: Not on file  . Transportation needs    Medical: Not on file    Non-medical: Not on file  Tobacco Use  . Smoking status: Current Every Day Smoker    Packs/day: 1.00    Years: 40.00    Pack years: 40.00    Types: Cigarettes  . Smokeless tobacco: Never Used  Substance and Sexual Activity  . Alcohol use: No  . Drug use: No  . Sexual activity: Not Currently  Lifestyle  . Physical activity    Days per week: Not on file    Minutes per session: Not on file  . Stress: Not on file  Relationships  . Social Herbalist on phone: Not on file    Gets together: Not on file    Attends religious service: Not on file    Active member of club or organization: Not on file    Attends meetings of clubs or organizations: Not on file    Relationship status: Not on file  . Intimate partner violence    Fear of current or ex partner: Not on file    Emotionally abused: Not on file    Physically abused: Not on file    Forced sexual activity: Not on file  Other Topics Concern  . Not on file  Social History Narrative   Dr.  Cyril Loosen -(surgeon) [phone]   Dr. Marilynne Halsted- Endocrinologist  [facility]    Family History  Problem Relation Age of Onset  . Coronary artery disease Father   . Cirrhosis Mother   . Cancer Other        family history   . Hypertension Maternal Grandmother     Objective: Office vital signs reviewed. BP 140/86 Comment: manual  Pulse 70   Temp (!) 97.4 F (36.3 C) (Temporal)   Ht 5' 5.5" (1.664 m)   Wt 158 lb (71.7 kg)   BMI 25.89 kg/m   Physical Examination:  General: Awake, alert, well nourished, No acute distress HEENT: Normal; no carotid bruits; no thyromegaly or palpable masses.  She has a well-healed scar at the base of her neck.  No exophthalmos Cardio: regular rate and rhythm, S1S2 heard, no murmurs appreciated Pulm: clear to auscultation bilaterally, no wheezes, rhonchi or rales; normal work of breathing on room air Extremities: warm, well  perfused, No edema, cyanosis or clubbing; +2 pulses bilaterally Neuro: No tremor Psych: Mood stable, speech normal, affect appropriate, pleasant and interactive  Assessment/ Plan: 73 y.o. female   1. Essential hypertension Initially elevated but this is thought to be secondary to anxiety and whitecoat syndrome.  Manual recheck at the end of the visit was within normal range.  Continue current  regimen.  Check BMP - Basic Metabolic Panel  2. Pure hypercholesterolemia Continue statin for secondary prevention given history of MI  3. Hypothyroidism, unspecified type Asymptomatic.  Check thyroid panel - Thyroid Panel With TSH  4. GAD (generalized anxiety disorder) National cardiac database was reviewed and there were no red flags.  PRN Ativan prescribed.  She has very rare use.  Rely on buspirone for overall anxiety disorder - ToxASSURE Select 13 (MW), Urine - LORazepam (ATIVAN) 1 MG tablet; Take 1 tablet (1 mg total) by mouth daily as needed for anxiety.  Dispense: 30 tablet; Refill: 1  5. Controlled substance agreement signed - ToxASSURE Select 13 (MW), Urine   Orders Placed This Encounter  Procedures  . ToxASSURE Select 13 (MW), Urine  . Basic Metabolic Panel  . Thyroid Panel With TSH   No orders of the defined types were placed in this encounter.    [name], DO [place] [phone]

## 2018-12-14 LAB — THYROID PANEL WITH TSH
Free Thyroxine Index: 4.3 (ref 1.2–4.9)
T3 Uptake Ratio: 30 % (ref 24–39)
T4, Total: 14.4 ug/dL — ABNORMAL HIGH (ref 4.5–12.0)
TSH: 1 u[IU]/mL (ref 0.450–4.500)

## 2018-12-14 LAB — BASIC METABOLIC PANEL
BUN/Creatinine Ratio: 17 (ref 12–28)
BUN: 16 mg/dL (ref 8–27)
CO2: 19 mmol/L — ABNORMAL LOW (ref 20–29)
Calcium: 9.8 mg/dL (ref 8.7–10.3)
Chloride: 104 mmol/L (ref 96–106)
Creatinine, Ser: 0.96 mg/dL (ref 0.57–1.00)
GFR calc Af Amer: 68 mL/min/{1.73_m2} (ref >59)
GFR calc non Af Amer: 59 mL/min/{1.73_m2} — ABNORMAL LOW (ref >59)
Glucose: 104 mg/dL — ABNORMAL HIGH (ref 65–99)
Potassium: 4.7 mmol/L (ref 3.5–5.2)
Sodium: 142 mmol/L (ref 134–144)

## 2018-12-15 LAB — TOXASSURE SELECT 13 (MW), URINE

## 2018-12-18 ENCOUNTER — Telehealth: Payer: Self-pay | Admitting: Family Medicine

## 2018-12-18 NOTE — Telephone Encounter (Signed)
I explained to patient last week that the cholesterol and vit d tests were not due until February.  If she wants a CBC, ok to add it.

## 2018-12-18 NOTE — Telephone Encounter (Signed)
Aware.  Last cholesterol numbers were normal except the HDL was low.

## 2019-01-12 ENCOUNTER — Other Ambulatory Visit: Payer: Self-pay | Admitting: Family Medicine

## 2019-01-12 DIAGNOSIS — Z1231 Encounter for screening mammogram for malignant neoplasm of breast: Secondary | ICD-10-CM

## 2019-01-29 ENCOUNTER — Other Ambulatory Visit: Payer: Self-pay

## 2019-01-29 ENCOUNTER — Ambulatory Visit (INDEPENDENT_AMBULATORY_CARE_PROVIDER_SITE_OTHER): Payer: Medicare Other

## 2019-01-29 DIAGNOSIS — Z23 Encounter for immunization: Secondary | ICD-10-CM | POA: Diagnosis not present

## 2019-02-02 DIAGNOSIS — H43811 Vitreous degeneration, right eye: Secondary | ICD-10-CM | POA: Diagnosis not present

## 2019-02-02 DIAGNOSIS — H02831 Dermatochalasis of right upper eyelid: Secondary | ICD-10-CM | POA: Diagnosis not present

## 2019-02-02 DIAGNOSIS — H15001 Unspecified scleritis, right eye: Secondary | ICD-10-CM | POA: Diagnosis not present

## 2019-02-02 DIAGNOSIS — H02051 Trichiasis without entropian right upper eyelid: Secondary | ICD-10-CM | POA: Diagnosis not present

## 2019-02-27 ENCOUNTER — Ambulatory Visit
Admission: RE | Admit: 2019-02-27 | Discharge: 2019-02-27 | Disposition: A | Discharge status: Home / Self Care | Payer: Medicare Other | Source: Ambulatory Visit | Attending: Family Medicine | Admitting: Family Medicine

## 2019-02-27 ENCOUNTER — Other Ambulatory Visit: Payer: Self-pay

## 2019-02-27 DIAGNOSIS — Z1231 Encounter for screening mammogram for malignant neoplasm of breast: Secondary | ICD-10-CM | POA: Diagnosis not present

## 2019-03-07 ENCOUNTER — Other Ambulatory Visit: Payer: Self-pay | Admitting: Physician Assistant

## 2019-03-19 DIAGNOSIS — Z7189 Other specified counseling: Secondary | ICD-10-CM | POA: Insufficient documentation

## 2019-03-19 NOTE — Progress Notes (Signed)
Cardiology Office Note   Date:  03/21/2019   ID:  Madison, [name] [birth year], MRN [id number]  PCP:  [name], DO  Cardiologist:   Minus Breeding, MD   Chief Complaint  Patient presents with  . Coronary Artery Disease      History of Present Illness: Madison [name] is a 73 y.o. female who for followup of CAD.  She had a negative Lexiscan Myoview and normal echo in 2018.  Since I last saw her she has done well.  She is walking behind a mower in the summertime and raking leaves in the fall. The patient denies any new symptoms such as chest discomfort, neck or arm discomfort. There has been no new shortness of breath, PND or orthopnea. There have been no reported palpitations, presyncope or syncope.    Past Medical History:  Diagnosis Date  . CAD (coronary artery disease)    PCI of an occluded right coronary artery 2001. 70% LAD stenosis, 30% circumflex stenosis.  . Diverticulosis   . Hyperlipidemia   . Hypertension   . IBS (irritable bowel syndrome)   . Leukocytosis, unspecified 07/24/2012  . Myocardial infarction ([place]) 2001  . Need for prophylactic hormone replacement therapy (postmenopausal)   . Osteopenia   . Osteoporosis   . Thyroid cancer [facility]) 2004   Dr. Clovia Cuff   . Thyroid nodule 2004  . Tobacco abuse     Past Surgical History:  Procedure Laterality Date  . APPENDECTOMY  1955  . BACK SURGERY  06/2000  . CHOLECYSTECTOMY  07/2002  . EYE SURGERY Left 05/16/2017   cataract  . LIPOMA EXCISION Right 08/2003   r arm   . peserie    . THYROIDECTOMY Bilateral 02/2003     Current Outpatient Medications  Medication Sig Dispense Refill  . amLODipine (NORVASC) 5 MG tablet TAKE ONE (1) TABLET EACH DAY 90 tablet 3  . aspirin EC 81 MG tablet Take 1 tablet (81 mg total) by mouth daily.    . busPIRone (BUSPAR) 30 MG tablet Take 1 tablet (30 mg total) by mouth 2 (two) times daily. 180 tablet 3  . conjugated estrogens (PREMARIN) vaginal cream 1 application 3  times weekly 42.5 g 12  . fenofibrate 160 MG tablet TAKE ONE (1) TABLET EACH DAY 90 tablet 3  . LORazepam (ATIVAN) 1 MG tablet Take 1 tablet (1 mg total) by mouth daily as needed for anxiety. 30 tablet 1  . losartan (COZAAR) 100 MG tablet Take 1 tablet (100 mg total) by mouth daily. 90 tablet 3  . Melatonin 5 MG TABS Take by mouth as needed.    . metoprolol tartrate (LOPRESSOR) 50 MG tablet Take 0.5 tablets (25 mg total) by mouth 2 (two) times daily. 90 tablet 3  . nitroGLYCERIN (NITROSTAT) 0.4 MG SL tablet Place 1 tablet (0.4 mg total) under the tongue every 5 (five) minutes as needed for chest pain. 25 tablet 3  . rosuvastatin (CRESTOR) 40 MG tablet TAKE ONE TABLET DAILY AS DIRECTED 90 tablet 3  . SYNTHROID 137 MCG tablet Take 1 tablet (137 mcg total) by mouth daily before breakfast. TAKE 1 TABLET DAILY 34 tablet 6  . Vitamin D, Ergocalciferol, (DRISDOL) 1.25 MG (50000 UT) CAPS capsule Take 1 capsule (50,000 Units total) by mouth every 7 (seven) days. 12 capsule 3   No current facility-administered medications for this visit.     Allergies:   Banana, Bupropion hcl, Chantix [varenicline], Latex, and Zetia [ezetimibe]  ROS:  Please see the history of present illness.   Otherwise, review of systems are positive for none.   All other systems are reviewed and negative.    PHYSICAL EXAM: VS:  BP (!) 155/90   Pulse 70   Ht 5' 5.5" (1.664 m)   Wt 159 lb (72.1 kg)   BMI 26.06 kg/m  , BMI Body mass index is 26.06 kg/m. GENERAL:  Well appearing NECK:  No jugular venous distention, waveform within normal limits, carotid upstroke brisk and symmetric, no bruits, no thyromegaly LUNGS:  Clear to auscultation bilaterally CHEST:  Unremarkable HEART:  PMI not displaced or sustained,S1 and S2 within normal limits, no S3, no S4, no clicks, no rubs, no murmurs ABD:  Flat, positive bowel sounds normal in frequency in pitch, no bruits, no rebound, no guarding, no midline pulsatile mass, no hepatomegaly,  no splenomegaly EXT:  2 plus pulses throughout, no edema, no cyanosis no clubbing   EKG:  EKG is ordered today. The ekg ordered today demonstrates sinus rhythm, rate 70, right bundle branch block, no change from previous, no acute ST-T wave changes.   Recent Labs: 05/30/2018: ALT 20; Hemoglobin 15.8; Platelets 256 12/13/2018: BUN 16; Creatinine, Ser 0.96; Potassium 4.7; Sodium 142; TSH 1.000    Lipid Panel    Component Value Date/Time   CHOL 106 05/30/2018 0936   CHOL 107 08/30/2012 0921   TRIG 136 05/30/2018 0936   TRIG 147 05/18/2016 1209   TRIG 165 (H) 08/30/2012 0921   HDL 24 (L) 05/30/2018 0936   HDL 22 (L) 05/18/2016 1209   HDL 32 (L) 08/30/2012 0921   CHOLHDL 4.4 05/30/2018 0936   LDLCALC 55 05/30/2018 0936   LDLCALC 69 01/17/2014 0850   LDLCALC 42 08/30/2012 0921      Wt Readings from Last 3 Encounters:  03/21/19 159 lb (72.1 kg)  12/13/18 158 lb (71.7 kg)  05/30/18 159 lb (72.1 kg)      Other studies Reviewed: Additional studies/ records that were reviewed today include: None. Review of the above records demonstrates:  Please see elsewhere in the note.     ASSESSMENT AND PLAN:   CORONARY ARTERY DISEASE -  The patient's had no new symptoms since her 2018 stress test.  No further cardiac testing is suggested.   RBBB:      This is chronic.  She is aware.  She has had no symptoms of bradycardia arrhythmia.  No change in therapy.   TOBACCO ABUSE -  We talked about this again today and she understands need to quit smoking.  DYSLIPIDEMIA -  LDL is LDL was 55 in February.  No change in therapy.  HTN - The blood pressure is elevated but she is going to keep a blood pressure diary.  She is wanting to have lower medicines but would agree I think to take increase Norvasc if her blood pressure is running elevated.   COVID 19 EDUCATION -  I answered questions and he talked about the vaccine.   Current medicines are reviewed at length with the patient today.   The patient does not have concerns regarding medicines.  The following changes have been made:  no change  Labs/ tests ordered today include:   Orders Placed This Encounter  Procedures  . EKG 12-Lead     Disposition:   FU with me in one year.     Signed, Minus Breeding, MD  03/21/2019 1:53 PM    [facility]

## 2019-03-21 ENCOUNTER — Other Ambulatory Visit: Payer: Self-pay

## 2019-03-21 ENCOUNTER — Encounter: Payer: Self-pay | Admitting: Cardiology

## 2019-03-21 ENCOUNTER — Ambulatory Visit: Payer: Medicare Other | Admitting: Cardiology

## 2019-03-21 VITALS — BP 155/90 | HR 70 | Ht 65.5 in | Wt 159.0 lb

## 2019-03-21 DIAGNOSIS — I2510 Atherosclerotic heart disease of native coronary artery without angina pectoris: Secondary | ICD-10-CM | POA: Diagnosis not present

## 2019-03-21 DIAGNOSIS — E785 Hyperlipidemia, unspecified: Secondary | ICD-10-CM

## 2019-03-21 DIAGNOSIS — Z720 Tobacco use: Secondary | ICD-10-CM

## 2019-03-21 DIAGNOSIS — I4510 Unspecified right bundle-branch block: Secondary | ICD-10-CM | POA: Diagnosis not present

## 2019-03-21 DIAGNOSIS — Z7189 Other specified counseling: Secondary | ICD-10-CM | POA: Diagnosis not present

## 2019-03-21 NOTE — Patient Instructions (Signed)
Medication Instructions:  The current medical regimen is effective;  continue present plan and medications.  *If you need a refill on your cardiac medications before your next appointment, please call your pharmacy*  Follow-Up: At [facility], you and your health needs are our priority.  As part of our continuing mission to provide you with exceptional heart care, we have created designated Provider Care Teams.  These Care Teams include your primary Cardiologist (physician) and Advanced Practice Providers (APPs -  Physician Assistants and Nurse Practitioners) who all work together to provide you with the care you need, when you need it.  Your next appointment:   1 year(s)  The format for your next appointment:   In Person  Provider:   Minus Breeding, MD  Thank you for choosing [facility]!!

## 2019-04-23 DIAGNOSIS — N8111 Cystocele, midline: Secondary | ICD-10-CM | POA: Diagnosis not present

## 2019-05-08 ENCOUNTER — Ambulatory Visit: Payer: Medicare Other | Attending: Internal Medicine

## 2019-05-08 DIAGNOSIS — Z23 Encounter for immunization: Secondary | ICD-10-CM | POA: Insufficient documentation

## 2019-05-08 NOTE — Progress Notes (Signed)
   Covid-19 Vaccination Clinic  Name:  Madison [name]    MRN: [id number] DOB: 1945-09-09  05/08/2019  [name] was observed post Covid-19 immunization for 15 minutes without incidence. She was provided with Vaccine Information Sheet and instruction to access the V-Safe system.   [name] was instructed to call 911 with any severe reactions post vaccine: Marland Kitchen Difficulty breathing  . Swelling of your face and throat  . A fast heartbeat  . A bad rash all over your body  . Dizziness and weakness    Immunizations Administered    Name Date Dose VIS Date Route   Pfizer COVID-19 Vaccine 05/08/2019  5:37 PM 0.3 mL 03/30/2019 Intramuscular   Manufacturer: [place]   Lot: [id number]   [place]: [id number]

## 2019-05-14 DIAGNOSIS — Z79899 Other long term (current) drug therapy: Secondary | ICD-10-CM | POA: Diagnosis not present

## 2019-05-14 DIAGNOSIS — E890 Postprocedural hypothyroidism: Secondary | ICD-10-CM | POA: Diagnosis not present

## 2019-05-14 DIAGNOSIS — C73 Malignant neoplasm of thyroid gland: Secondary | ICD-10-CM | POA: Diagnosis not present

## 2019-05-18 ENCOUNTER — Other Ambulatory Visit: Payer: Self-pay | Admitting: Family Medicine

## 2019-05-18 DIAGNOSIS — F411 Generalized anxiety disorder: Secondary | ICD-10-CM

## 2019-05-27 ENCOUNTER — Ambulatory Visit: Payer: Medicare Other | Attending: Internal Medicine

## 2019-05-27 DIAGNOSIS — Z23 Encounter for immunization: Secondary | ICD-10-CM

## 2019-05-27 NOTE — Progress Notes (Signed)
   Covid-19 Vaccination Clinic  Name:  Madison [name]    MRN: [id number] DOB: 17-Jan-1946  05/27/2019  Ms. Ganoe was observed post Covid-19 immunization for 15 minutes without incidence. She was provided with Vaccine Information Sheet and instruction to access the V-Safe system.   Ms. Morado was instructed to call 911 with any severe reactions post vaccine: Marland Kitchen Difficulty breathing  . Swelling of your face and throat  . A fast heartbeat  . A bad rash all over your body  . Dizziness and weakness    Immunizations Administered    Name Date Dose VIS Date Route   Pfizer COVID-19 Vaccine 05/27/2019  1:04 PM 0.3 mL 03/30/2019 Intramuscular   Manufacturer: [place]   Lot: [id number]   [place]: [id number]

## 2019-05-30 ENCOUNTER — Other Ambulatory Visit: Payer: Self-pay | Admitting: Family Medicine

## 2019-05-30 NOTE — Telephone Encounter (Signed)
OV 06/18/19

## 2019-05-31 ENCOUNTER — Other Ambulatory Visit: Payer: Self-pay | Admitting: Family Medicine

## 2019-06-15 ENCOUNTER — Other Ambulatory Visit: Payer: Self-pay

## 2019-06-18 ENCOUNTER — Ambulatory Visit: Payer: Medicare Other | Admitting: Family Medicine

## 2019-07-01 NOTE — Progress Notes (Signed)
Subjective: CC:f/u thyroid, HTN, GAD PCP: [name], DO MPN:TIRWERX Madison [name] is a 74 y.o. female presenting to clinic today for:  1. Hypothyroidism History: Onset after resection of her thyroid cancer about 19 years ago  She reports compliance with Synthroid 137 mcg daily except for 1/2 tablet once per week per her endocrinologist.  She has follow-up with her endocrinologist in April for repeat labs.  Denies any heart palpitations, unplanned weight loss, change in voice, difficulty swallowing.  2. GAD Mostly controlled with buspirone 30 mg twice daily.   She continues to use Ativan very sparingly.  Typically breaks a 1 mg tablet in quarters and uses maybe 2-3 times per week.  Denies any excessive daytime sedation, falls, change in memory, respiratory depression.  3. Hypertension/ HLD Reports compliance with amlodipine 5 mg daily, losartan 100 mg daily, metoprolol 50 mg twice daily, Crestor 40 mg daily and fenofibrate 160 mg daily.  BPs run 540G to 867Y systolic over 19J to 09T diastolic at home.  She brings a blood pressure log with today's blood pressure 119/60.   No chest pain, shortness of breath, lower extremity edema, dizziness, visual disturbance.  4.  Rhinorrhea Patient reports longstanding history of rhinorrhea but seems worse lately.  No coughing, fevers, myalgia.  She has not used anything because she was worried that it may impact her blood pressure.  5.  Nail fungus Patient notes that there is some discoloration of her right great toenail that she would have me look at.  She is not use anything over-the-counter for like to get some advice on what she can use to treat.  ROS: Per HPI  Allergies  Allergen Reactions  . Banana     Throat and ears itch,ears feel like they swell  . Bupropion Hcl     REACTION: paranoia  . Chantix [Varenicline]     paranoia  . Latex Rash  . Zetia [Ezetimibe]     Myalgia   Past Medical History:  Diagnosis Date  . CAD (coronary  artery disease)    PCI of an occluded right coronary artery 2001. 70% LAD stenosis, 30% circumflex stenosis.  . Diverticulosis   . Hyperlipidemia   . Hypertension   . IBS (irritable bowel syndrome)   . Leukocytosis, unspecified 07/24/2012  . Myocardial infarction ([place]) 2001  . Need for prophylactic hormone replacement therapy (postmenopausal)   . Osteopenia   . Osteoporosis   . Thyroid cancer [facility]) 2004   Dr. Clovia Cuff   . Thyroid nodule 2004  . Tobacco abuse     Current Outpatient Medications:  .  amLODipine (NORVASC) 5 MG tablet, TAKE ONE (1) TABLET EACH DAY, Disp: 90 tablet, Rfl: 3 .  aspirin EC 81 MG tablet, Take 1 tablet (81 mg total) by mouth daily., Disp: , Rfl:  .  busPIRone (BUSPAR) 30 MG tablet, Take 1 tablet (30 mg total) by mouth 2 (two) times daily., Disp: 180 tablet, Rfl: 3 .  conjugated estrogens (PREMARIN) vaginal cream, 1 application 3 times weekly, Disp: 42.5 g, Rfl: 12 .  fenofibrate 160 MG tablet, TAKE ONE (1) TABLET EACH DAY, Disp: 90 tablet, Rfl: 0 .  LORazepam (ATIVAN) 1 MG tablet, Take 1 tablet (1 mg total) by mouth daily as needed for anxiety., Disp: 30 tablet, Rfl: 1 .  losartan (COZAAR) 100 MG tablet, TAKE ONE (1) TABLET EACH DAY, Disp: 90 tablet, Rfl: 0 .  Melatonin 5 MG TABS, Take by mouth as needed., Disp: , Rfl:  .  metoprolol tartrate (LOPRESSOR) 50 MG tablet, Take 0.5 tablets (25 mg total) by mouth 2 (two) times daily., Disp: 90 tablet, Rfl: 3 .  nitroGLYCERIN (NITROSTAT) 0.4 MG SL tablet, Place 1 tablet (0.4 mg total) under the tongue every 5 (five) minutes as needed for chest pain., Disp: 25 tablet, Rfl: 3 .  rosuvastatin (CRESTOR) 40 MG tablet, TAKE ONE TABLET DAILY AS DIRECTED, Disp: 90 tablet, Rfl: 0 .  SYNTHROID 137 MCG tablet, Take 1 tablet (137 mcg total) by mouth daily before breakfast. TAKE 1 TABLET DAILY, Disp: 34 tablet, Rfl: 6 .  Vitamin D, Ergocalciferol, (DRISDOL) 1.25 MG (50000 UT) CAPS capsule, Take 1 capsule (50,000 Units total) by mouth  every 7 (seven) days., Disp: 12 capsule, Rfl: 3 Social History   Socioeconomic History  . Marital status: Married    Spouse name: Not on file  . Number of children: 2  . Years of education: Not on file  . Highest education level: Not on file  Occupational History  . Occupation: Retired     Comment: Engineering geologist  Tobacco Use  . Smoking status: Current Every Day Smoker    Packs/day: 1.00    Years: 40.00    Pack years: 40.00    Types: Cigarettes  . Smokeless tobacco: Never Used  Substance and Sexual Activity  . Alcohol use: No  . Drug use: No  . Sexual activity: Not Currently  Other Topics Concern  . Not on file  Social History Narrative   Dr.  Cyril Loosen -(surgeon) [phone]   Dr. Marilynne Halsted- Endocrinologist  [facility]    Social Determinants of Health   Financial Resource Strain:   . Difficulty of Paying Living Expenses:   Food Insecurity:   . Worried About Charity fundraiser in the Last Year:   . Arboriculturist in the Last Year:   Transportation Needs:   . Film/video editor (Medical):   Marland Kitchen Lack of Transportation (Non-Medical):   Physical Activity:   . Days of Exercise per Week:   . Minutes of Exercise per Session:   Stress:   . Feeling of Stress :   Social Connections:   . Frequency of Communication with Friends and Family:   . Frequency of Social Gatherings with Friends and Family:   . Attends Religious Services:   . Active Member of Clubs or Organizations:   . Attends Archivist Meetings:   Marland Kitchen Marital Status:   Intimate Partner Violence:   . Fear of Current or Ex-Partner:   . Emotionally Abused:   Marland Kitchen Physically Abused:   . Sexually Abused:    Family History  Problem Relation Age of Onset  . Coronary artery disease Father   . Heart disease Father   . Cirrhosis Mother   . Alcohol abuse Mother   . Cancer Other        family history   . Hypertension Maternal Grandmother     Objective: Office vital signs reviewed. BP (!) 149/91   Pulse 69    Temp 97.8 F (36.6 C) (Temporal)   Ht 5' 5.5" (1.664 m)   Wt 156 lb (70.8 kg)   SpO2 94%   BMI 25.56 kg/m   Physical Examination:  General: Awake, alert, well nourished, No acute distress HEENT: Normal; Well-healed scar at the base of her neck.  No exophthalmos Cardio: regular rate and rhythm, S1S2 heard, no murmurs appreciated Pulm: clear to auscultation bilaterally, no wheezes, rhonchi or rales; normal work of breathing on room  air Extremities: warm, well perfused, No edema, cyanosis or clubbing; +2 pulses bilaterally Neuro: No tremor Psych: Mood stable, speech normal, affect appropriate, pleasant and interactive Skin: Normal temperature.  On her left middle finger, she does have a small cyst near the cuticle.  She also has subsequent indentation of her nail.  Right great toenails with onychomycotic change. Depression screen [facility] 2/9 07/02/2019 12/13/2018 05/30/2018 11/29/2017 05/30/2017  Decreased Interest 0 0 0 0 1  Down, Depressed, Hopeless 1 - '1 1 1  '$ PHQ - 2 Score 1 0 '1 1 2  '$ Altered sleeping - - - - 3  Tired, decreased energy - 0 - - 3  Change in appetite - 0 - - 2  Feeling bad or failure about yourself  - 0 - - 2  Trouble concentrating - 0 - - 1  Moving slowly or fidgety/restless - 0 - - 0  Suicidal thoughts - 0 - - 0  PHQ-9 Score - - - - 13  Difficult doing work/chores - Not difficult at all - - Somewhat difficult   GAD 7 : Generalized Anxiety Score 07/02/2019 12/13/2018  Nervous, Anxious, on Edge 2 1  Control/stop worrying 1 1  Worry too much - different things 2 1  Trouble relaxing 1 1  Restless 1 -  Easily annoyed or irritable 0 1  Afraid - awful might happen 0 1  Total GAD 7 Score 7 -  Anxiety Difficulty Not difficult at all Somewhat difficult   Assessment/ Plan: 74 y.o. female   1. GAD (generalized anxiety disorder) Slightly exacerbated since last check but subjectively she noted that she is doing fine.  I renewed her buspirone and her Ativan.  She has rare use of  the Ativan.  She is up-to-date on her urine drug screen and controlled substance contract. The Narcotic Database has been reviewed.  There were no red flags.   - LORazepam (ATIVAN) 1 MG tablet; Take 1 tablet (1 mg total) by mouth daily as needed for anxiety. 1/2- 1 tablet  Dispense: 30 tablet; Refill: 1 - busPIRone (BUSPAR) 30 MG tablet; Take 1 tablet (30 mg total) by mouth 2 (two) times daily.  Dispense: 180 tablet; Refill: 1  2. Postoperative hypothyroidism Managed by her endocrinologist.  I have updated her instructions.  Labs per endocrinology - levothyroxine (SYNTHROID) 137 MCG tablet; Take 137 mcg by mouth daily before breakfast. Daily except 1/2 tablet on sunday  3. Essential hypertension Borderline.  Continue current regimen. Home BPs controlled. - metoprolol tartrate (LOPRESSOR) 50 MG tablet; Take 1 tablet (50 mg total) by mouth daily.  Dispense: 90 tablet; Refill: 1 - losartan (COZAAR) 100 MG tablet; TAKE ONE (1) TABLET EACH DAY  Dispense: 90 tablet; Refill: 1 - amLODipine (NORVASC) 5 MG tablet; TAKE ONE (1) TABLET EACH DAY  Dispense: 90 tablet; Refill: 3  4. Pure hypercholesterolemia Check fasting lipid panel - Lipid Panel - CMP14+EGFR - fenofibrate 160 MG tablet; 1 tablet daily  Dispense: 90 tablet; Refill: 1 - rosuvastatin (CRESTOR) 40 MG tablet; 1 tablet daily  Dispense: 90 tablet; Refill: 1  5. Vitamin D deficiency - Vitamin D, Ergocalciferol, (DRISDOL) 1.25 MG (50000 UNIT) CAPS capsule; Take 1 capsule (50,000 Units total) by mouth every 7 (seven) days.  Dispense: 12 capsule; Refill: 3  6. Osteopenia of multiple sites - CMP14+EGFR - DG [facility] DEXA - VITAMIN D 25 Hydroxy (Vit-D Deficiency, Fractures)  7. Encounter for hepatitis C screening test for low risk patient - Hepatitis C antibody  8. Dyslipidemia -  nitroGLYCERIN (NITROSTAT) 0.4 MG SL tablet; Place 1 tablet (0.4 mg total) under the tongue every 5 (five) minutes as needed for chest pain.  Dispense: 25 tablet;  Refill: 3  9. Onychomycosis of great toe She will come in in 6 weeks for liver function tests.  We discussed the risks of Lamisil use. - terbinafine (LAMISIL) 250 MG tablet; Take 1 tablet (250 mg total) by mouth daily.  Dispense: 90 tablet; Refill: 0  10. Rhinorrhea Trial of 5 mg of Zyrtec nightly.  If no improvement I have given her hand prescription for Astelin - azelastine (ASTELIN) 0.1 % nasal spray; Place 1 spray into both nostrils 2 (two) times daily.  Dispense: 30 mL; Refill: 12  Orders Placed This Encounter  Procedures  . DG [facility] DEXA    Order Specific Question:   Reason for Exam (SYMPTOM  OR DIAGNOSIS REQUIRED)    Answer:   ostoepenia  . Lipid Panel  . CMP14+EGFR  . Hepatitis C antibody  . VITAMIN D 25 Hydroxy (Vit-D Deficiency, Fractures)  . CMP14+EGFR    Standing Status:   Future    Standing Expiration Date:   07/01/2020   Meds ordered this encounter  Medications  . metoprolol tartrate (LOPRESSOR) 50 MG tablet    Sig: Take 1 tablet (50 mg total) by mouth daily.    Dispense:  90 tablet    Refill:  1    Pt will call to refill  . losartan (COZAAR) 100 MG tablet    Sig: TAKE ONE (1) TABLET EACH DAY    Dispense:  90 tablet    Refill:  1  . fenofibrate 160 MG tablet    Sig: 1 tablet daily    Dispense:  90 tablet    Refill:  1  . Vitamin D, Ergocalciferol, (DRISDOL) 1.25 MG (50000 UNIT) CAPS capsule    Sig: Take 1 capsule (50,000 Units total) by mouth every 7 (seven) days.    Dispense:  12 capsule    Refill:  3  . LORazepam (ATIVAN) 1 MG tablet    Sig: Take 1 tablet (1 mg total) by mouth daily as needed for anxiety. 1/2- 1 tablet    Dispense:  30 tablet    Refill:  1  . rosuvastatin (CRESTOR) 40 MG tablet    Sig: 1 tablet daily    Dispense:  90 tablet    Refill:  1  . busPIRone (BUSPAR) 30 MG tablet    Sig: Take 1 tablet (30 mg total) by mouth 2 (two) times daily.    Dispense:  180 tablet    Refill:  1  . amLODipine (NORVASC) 5 MG tablet    Sig: TAKE ONE (1)  TABLET EACH DAY    Dispense:  90 tablet    Refill:  3  . nitroGLYCERIN (NITROSTAT) 0.4 MG SL tablet    Sig: Place 1 tablet (0.4 mg total) under the tongue every 5 (five) minutes as needed for chest pain.    Dispense:  25 tablet    Refill:  3  . terbinafine (LAMISIL) 250 MG tablet    Sig: Take 1 tablet (250 mg total) by mouth daily.    Dispense:  90 tablet    Refill:  0  . azelastine (ASTELIN) 0.1 % nasal spray    Sig: Place 1 spray into both nostrils 2 (two) times daily.    Dispense:  30 mL    Refill:  [place], DO [facility] [phone]

## 2019-07-02 ENCOUNTER — Ambulatory Visit (INDEPENDENT_AMBULATORY_CARE_PROVIDER_SITE_OTHER): Payer: Medicare Other | Admitting: Family Medicine

## 2019-07-02 ENCOUNTER — Other Ambulatory Visit: Payer: Self-pay

## 2019-07-02 ENCOUNTER — Encounter: Payer: Self-pay | Admitting: Family Medicine

## 2019-07-02 VITALS — BP 149/91 | HR 69 | Temp 97.8°F | Ht 65.5 in | Wt 156.0 lb

## 2019-07-02 DIAGNOSIS — E7800 Pure hypercholesterolemia, unspecified: Secondary | ICD-10-CM

## 2019-07-02 DIAGNOSIS — I10 Essential (primary) hypertension: Secondary | ICD-10-CM | POA: Diagnosis not present

## 2019-07-02 DIAGNOSIS — E890 Postprocedural hypothyroidism: Secondary | ICD-10-CM

## 2019-07-02 DIAGNOSIS — E559 Vitamin D deficiency, unspecified: Secondary | ICD-10-CM

## 2019-07-02 DIAGNOSIS — F411 Generalized anxiety disorder: Secondary | ICD-10-CM | POA: Diagnosis not present

## 2019-07-02 DIAGNOSIS — Z1159 Encounter for screening for other viral diseases: Secondary | ICD-10-CM

## 2019-07-02 DIAGNOSIS — E785 Hyperlipidemia, unspecified: Secondary | ICD-10-CM

## 2019-07-02 DIAGNOSIS — M8589 Other specified disorders of bone density and structure, multiple sites: Secondary | ICD-10-CM | POA: Diagnosis not present

## 2019-07-02 DIAGNOSIS — J3489 Other specified disorders of nose and nasal sinuses: Secondary | ICD-10-CM

## 2019-07-02 DIAGNOSIS — B351 Tinea unguium: Secondary | ICD-10-CM

## 2019-07-02 MED ORDER — ROSUVASTATIN CALCIUM 40 MG PO TABS: ORAL_TABLET | ORAL | 1 refills | Status: DC

## 2019-07-02 MED ORDER — AMLODIPINE BESYLATE 5 MG PO TABS: ORAL_TABLET | ORAL | 3 refills | Status: DC

## 2019-07-02 MED ORDER — AZELASTINE HCL 0.1 % NA SOLN: 1.0000 | Freq: Two times a day (BID) | NASAL | 12 refills | Status: DC

## 2019-07-02 MED ORDER — VITAMIN D (ERGOCALCIFEROL) 1.25 MG (50000 UNIT) PO CAPS: 50000.0000 [IU] | ORAL_CAPSULE | ORAL | 3 refills | Status: DC

## 2019-07-02 MED ORDER — NITROGLYCERIN 0.4 MG SL SUBL: 0.4000 mg | SUBLINGUAL_TABLET | SUBLINGUAL | 3 refills | Status: DC | PRN

## 2019-07-02 MED ORDER — FENOFIBRATE 160 MG PO TABS: ORAL_TABLET | ORAL | 1 refills | Status: DC

## 2019-07-02 MED ORDER — METOPROLOL TARTRATE 50 MG PO TABS: 50.0000 mg | ORAL_TABLET | Freq: Every day | ORAL | 1 refills | Status: DC

## 2019-07-02 MED ORDER — LORAZEPAM 1 MG PO TABS: 1.0000 mg | ORAL_TABLET | Freq: Every day | ORAL | 1 refills | Status: DC | PRN

## 2019-07-02 MED ORDER — LOSARTAN POTASSIUM 100 MG PO TABS: ORAL_TABLET | ORAL | 1 refills | Status: DC

## 2019-07-02 MED ORDER — TERBINAFINE HCL 250 MG PO TABS: 250.0000 mg | ORAL_TABLET | Freq: Every day | ORAL | 0 refills | Status: DC

## 2019-07-02 MED ORDER — BUSPIRONE HCL 30 MG PO TABS: 30.0000 mg | ORAL_TABLET | Freq: Two times a day (BID) | ORAL | 1 refills | Status: DC

## 2019-07-04 LAB — CMP14+EGFR
ALT: 18 IU/L (ref 0–32)
AST: 22 IU/L (ref 0–40)
Albumin/Globulin Ratio: 1.6 (ref 1.2–2.2)
Albumin: 4.5 g/dL (ref 3.7–4.7)
Alkaline Phosphatase: 62 IU/L (ref 39–117)
BUN/Creatinine Ratio: 18 (ref 12–28)
BUN: 15 mg/dL (ref 8–27)
Bilirubin Total: 0.4 mg/dL (ref 0.0–1.2)
CO2: 19 mmol/L — ABNORMAL LOW (ref 20–29)
Calcium: 9.8 mg/dL (ref 8.7–10.3)
Chloride: 106 mmol/L (ref 96–106)
Creatinine, Ser: 0.84 mg/dL (ref 0.57–1.00)
GFR calc Af Amer: 80 mL/min/{1.73_m2} (ref >59)
GFR calc non Af Amer: 70 mL/min/{1.73_m2} (ref >59)
Globulin, Total: 2.8 g/dL (ref 1.5–4.5)
Glucose: 99 mg/dL (ref 65–99)
Potassium: 4.5 mmol/L (ref 3.5–5.2)
Sodium: 140 mmol/L (ref 134–144)
Total Protein: 7.3 g/dL (ref 6.0–8.5)

## 2019-07-04 LAB — LIPID PANEL
Chol/HDL Ratio: 4 ratio (ref 0.0–4.4)
Cholesterol, Total: 113 mg/dL (ref 100–199)
HDL: 28 mg/dL — ABNORMAL LOW (ref >39)
LDL Chol Calc (NIH): 63 mg/dL (ref 0–99)
Triglycerides: 123 mg/dL (ref 0–149)
VLDL Cholesterol Cal: 22 mg/dL (ref 5–40)

## 2019-07-04 LAB — HEPATITIS C ANTIBODY: Hep C Virus Ab: 0.3 s/co ratio (ref 0.0–0.9)

## 2019-07-04 LAB — VITAMIN D 25 HYDROXY (VIT D DEFICIENCY, FRACTURES): Vit D, 25-Hydroxy: 60.2 ng/mL (ref 30.0–100.0)

## 2019-07-09 ENCOUNTER — Encounter: Payer: Self-pay | Admitting: Family Medicine

## 2019-07-09 ENCOUNTER — Ambulatory Visit (INDEPENDENT_AMBULATORY_CARE_PROVIDER_SITE_OTHER): Payer: Medicare Other

## 2019-07-09 ENCOUNTER — Other Ambulatory Visit: Payer: Self-pay

## 2019-07-09 DIAGNOSIS — Z780 Asymptomatic menopausal state: Secondary | ICD-10-CM | POA: Diagnosis not present

## 2019-07-09 DIAGNOSIS — M8589 Other specified disorders of bone density and structure, multiple sites: Secondary | ICD-10-CM | POA: Diagnosis not present

## 2019-07-18 ENCOUNTER — Telehealth: Payer: Self-pay | Admitting: Family Medicine

## 2019-07-18 NOTE — Chronic Care Management (AMB) (Signed)
  Chronic Care Management   Note  07/18/2019 Name: Madison [name] MRN: [id number] DOB: 1945/06/26  Madison [name] is a 74 y.o. year old female who is a primary care patient of Ronnie Doss M, DO. I reached out to [employer] by phone today in response to a referral sent by Madison [name]'s health plan.     Madison [name] was given information about Chronic Care Management services today including:  1. CCM service includes personalized support from designated clinical staff supervised by her physician, including individualized plan of care and coordination with other care providers 2. 24/7 contact phone numbers for assistance for urgent and routine care needs. 3. Service will only be billed when office clinical staff spend 20 minutes or more in a month to coordinate care. 4. Only one practitioner may furnish and bill the service in a calendar month. 5. The patient may stop CCM services at any time (effective at the end of the month) by phone call to the office staff. 6. The patient will be responsible for cost sharing (co-pay) of up to 20% of the service fee (after annual deductible is met).  Patient did not agree to enrollment in care management services and does not wish to consider at this time.  Follow up plan: The patient has been provided with contact information for the care management team and has been advised to call with any health related questions or concerns.   [place], [place] [address] Direct Dial: [phone] [email or web address]

## 2019-07-26 DIAGNOSIS — C73 Malignant neoplasm of thyroid gland: Secondary | ICD-10-CM | POA: Diagnosis not present

## 2019-08-13 ENCOUNTER — Other Ambulatory Visit: Payer: Self-pay

## 2019-08-13 ENCOUNTER — Other Ambulatory Visit: Payer: Medicare Other

## 2019-08-13 DIAGNOSIS — B351 Tinea unguium: Secondary | ICD-10-CM

## 2019-08-14 LAB — CMP14+EGFR
ALT: 15 IU/L (ref 0–32)
AST: 20 IU/L (ref 0–40)
Albumin/Globulin Ratio: 2 (ref 1.2–2.2)
Albumin: 4.4 g/dL (ref 3.7–4.7)
Alkaline Phosphatase: 61 IU/L (ref 39–117)
BUN/Creatinine Ratio: 16 (ref 12–28)
BUN: 14 mg/dL (ref 8–27)
Bilirubin Total: 0.2 mg/dL (ref 0.0–1.2)
CO2: 19 mmol/L — ABNORMAL LOW (ref 20–29)
Calcium: 9.1 mg/dL (ref 8.7–10.3)
Chloride: 106 mmol/L (ref 96–106)
Creatinine, Ser: 0.86 mg/dL (ref 0.57–1.00)
GFR calc Af Amer: 78 mL/min/{1.73_m2} (ref >59)
GFR calc non Af Amer: 68 mL/min/{1.73_m2} (ref >59)
Globulin, Total: 2.2 g/dL (ref 1.5–4.5)
Glucose: 103 mg/dL — ABNORMAL HIGH (ref 65–99)
Potassium: 4 mmol/L (ref 3.5–5.2)
Sodium: 141 mmol/L (ref 134–144)
Total Protein: 6.6 g/dL (ref 6.0–8.5)

## 2019-08-27 ENCOUNTER — Other Ambulatory Visit: Payer: Self-pay | Admitting: Family Medicine

## 2019-09-21 ENCOUNTER — Encounter: Payer: Self-pay | Admitting: *Deleted

## 2019-10-01 ENCOUNTER — Other Ambulatory Visit: Payer: Self-pay | Admitting: Family Medicine

## 2019-10-01 DIAGNOSIS — I10 Essential (primary) hypertension: Secondary | ICD-10-CM

## 2019-11-01 DIAGNOSIS — N8111 Cystocele, midline: Secondary | ICD-10-CM | POA: Diagnosis not present

## 2019-11-01 DIAGNOSIS — N952 Postmenopausal atrophic vaginitis: Secondary | ICD-10-CM | POA: Diagnosis not present

## 2019-11-12 DIAGNOSIS — M9902 Segmental and somatic dysfunction of thoracic region: Secondary | ICD-10-CM | POA: Diagnosis not present

## 2019-11-12 DIAGNOSIS — M9901 Segmental and somatic dysfunction of cervical region: Secondary | ICD-10-CM | POA: Diagnosis not present

## 2019-11-12 DIAGNOSIS — M546 Pain in thoracic spine: Secondary | ICD-10-CM | POA: Diagnosis not present

## 2019-11-12 DIAGNOSIS — S134XXA Sprain of ligaments of cervical spine, initial encounter: Secondary | ICD-10-CM | POA: Diagnosis not present

## 2019-11-12 DIAGNOSIS — M9903 Segmental and somatic dysfunction of lumbar region: Secondary | ICD-10-CM | POA: Diagnosis not present

## 2019-11-15 DIAGNOSIS — M9901 Segmental and somatic dysfunction of cervical region: Secondary | ICD-10-CM | POA: Diagnosis not present

## 2019-11-15 DIAGNOSIS — M9902 Segmental and somatic dysfunction of thoracic region: Secondary | ICD-10-CM | POA: Diagnosis not present

## 2019-11-15 DIAGNOSIS — M546 Pain in thoracic spine: Secondary | ICD-10-CM | POA: Diagnosis not present

## 2019-11-15 DIAGNOSIS — S134XXA Sprain of ligaments of cervical spine, initial encounter: Secondary | ICD-10-CM | POA: Diagnosis not present

## 2019-11-15 DIAGNOSIS — M9903 Segmental and somatic dysfunction of lumbar region: Secondary | ICD-10-CM | POA: Diagnosis not present

## 2019-11-20 DIAGNOSIS — M546 Pain in thoracic spine: Secondary | ICD-10-CM | POA: Diagnosis not present

## 2019-11-20 DIAGNOSIS — M9901 Segmental and somatic dysfunction of cervical region: Secondary | ICD-10-CM | POA: Diagnosis not present

## 2019-11-20 DIAGNOSIS — M9903 Segmental and somatic dysfunction of lumbar region: Secondary | ICD-10-CM | POA: Diagnosis not present

## 2019-11-20 DIAGNOSIS — M9902 Segmental and somatic dysfunction of thoracic region: Secondary | ICD-10-CM | POA: Diagnosis not present

## 2019-11-20 DIAGNOSIS — S134XXA Sprain of ligaments of cervical spine, initial encounter: Secondary | ICD-10-CM | POA: Diagnosis not present

## 2019-11-21 ENCOUNTER — Other Ambulatory Visit: Payer: Self-pay | Admitting: Family Medicine

## 2019-11-21 DIAGNOSIS — F411 Generalized anxiety disorder: Secondary | ICD-10-CM

## 2019-11-21 NOTE — Telephone Encounter (Signed)
Buspar 30mg  BID was sent during our last visit for 20m supply.  Is she out of this?

## 2019-11-22 DIAGNOSIS — S134XXA Sprain of ligaments of cervical spine, initial encounter: Secondary | ICD-10-CM | POA: Diagnosis not present

## 2019-11-22 DIAGNOSIS — M9903 Segmental and somatic dysfunction of lumbar region: Secondary | ICD-10-CM | POA: Diagnosis not present

## 2019-11-22 DIAGNOSIS — M9902 Segmental and somatic dysfunction of thoracic region: Secondary | ICD-10-CM | POA: Diagnosis not present

## 2019-11-22 DIAGNOSIS — M546 Pain in thoracic spine: Secondary | ICD-10-CM | POA: Diagnosis not present

## 2019-11-22 DIAGNOSIS — M9901 Segmental and somatic dysfunction of cervical region: Secondary | ICD-10-CM | POA: Diagnosis not present

## 2019-11-26 DIAGNOSIS — M9903 Segmental and somatic dysfunction of lumbar region: Secondary | ICD-10-CM | POA: Diagnosis not present

## 2019-11-26 DIAGNOSIS — M9901 Segmental and somatic dysfunction of cervical region: Secondary | ICD-10-CM | POA: Diagnosis not present

## 2019-11-26 DIAGNOSIS — S134XXA Sprain of ligaments of cervical spine, initial encounter: Secondary | ICD-10-CM | POA: Diagnosis not present

## 2019-11-26 DIAGNOSIS — M9902 Segmental and somatic dysfunction of thoracic region: Secondary | ICD-10-CM | POA: Diagnosis not present

## 2019-11-26 DIAGNOSIS — M546 Pain in thoracic spine: Secondary | ICD-10-CM | POA: Diagnosis not present

## 2019-11-29 DIAGNOSIS — S134XXA Sprain of ligaments of cervical spine, initial encounter: Secondary | ICD-10-CM | POA: Diagnosis not present

## 2019-11-29 DIAGNOSIS — M9901 Segmental and somatic dysfunction of cervical region: Secondary | ICD-10-CM | POA: Diagnosis not present

## 2019-11-29 DIAGNOSIS — M9903 Segmental and somatic dysfunction of lumbar region: Secondary | ICD-10-CM | POA: Diagnosis not present

## 2019-11-29 DIAGNOSIS — M9902 Segmental and somatic dysfunction of thoracic region: Secondary | ICD-10-CM | POA: Diagnosis not present

## 2019-11-29 DIAGNOSIS — M546 Pain in thoracic spine: Secondary | ICD-10-CM | POA: Diagnosis not present

## 2019-12-03 ENCOUNTER — Other Ambulatory Visit: Payer: Self-pay | Admitting: Family Medicine

## 2019-12-03 DIAGNOSIS — I10 Essential (primary) hypertension: Secondary | ICD-10-CM

## 2019-12-03 DIAGNOSIS — E7800 Pure hypercholesterolemia, unspecified: Secondary | ICD-10-CM

## 2019-12-04 DIAGNOSIS — S134XXA Sprain of ligaments of cervical spine, initial encounter: Secondary | ICD-10-CM | POA: Diagnosis not present

## 2019-12-04 DIAGNOSIS — M546 Pain in thoracic spine: Secondary | ICD-10-CM | POA: Diagnosis not present

## 2019-12-04 DIAGNOSIS — M9901 Segmental and somatic dysfunction of cervical region: Secondary | ICD-10-CM | POA: Diagnosis not present

## 2019-12-04 DIAGNOSIS — M9903 Segmental and somatic dysfunction of lumbar region: Secondary | ICD-10-CM | POA: Diagnosis not present

## 2019-12-04 DIAGNOSIS — M9902 Segmental and somatic dysfunction of thoracic region: Secondary | ICD-10-CM | POA: Diagnosis not present

## 2019-12-10 DIAGNOSIS — M9901 Segmental and somatic dysfunction of cervical region: Secondary | ICD-10-CM | POA: Diagnosis not present

## 2019-12-10 DIAGNOSIS — S134XXA Sprain of ligaments of cervical spine, initial encounter: Secondary | ICD-10-CM | POA: Diagnosis not present

## 2019-12-10 DIAGNOSIS — M546 Pain in thoracic spine: Secondary | ICD-10-CM | POA: Diagnosis not present

## 2019-12-10 DIAGNOSIS — M9903 Segmental and somatic dysfunction of lumbar region: Secondary | ICD-10-CM | POA: Diagnosis not present

## 2019-12-10 DIAGNOSIS — M9902 Segmental and somatic dysfunction of thoracic region: Secondary | ICD-10-CM | POA: Diagnosis not present

## 2019-12-17 DIAGNOSIS — M9903 Segmental and somatic dysfunction of lumbar region: Secondary | ICD-10-CM | POA: Diagnosis not present

## 2019-12-17 DIAGNOSIS — S134XXA Sprain of ligaments of cervical spine, initial encounter: Secondary | ICD-10-CM | POA: Diagnosis not present

## 2019-12-17 DIAGNOSIS — M9901 Segmental and somatic dysfunction of cervical region: Secondary | ICD-10-CM | POA: Diagnosis not present

## 2019-12-17 DIAGNOSIS — M546 Pain in thoracic spine: Secondary | ICD-10-CM | POA: Diagnosis not present

## 2019-12-17 DIAGNOSIS — M9902 Segmental and somatic dysfunction of thoracic region: Secondary | ICD-10-CM | POA: Diagnosis not present

## 2019-12-20 DIAGNOSIS — S134XXA Sprain of ligaments of cervical spine, initial encounter: Secondary | ICD-10-CM | POA: Diagnosis not present

## 2019-12-20 DIAGNOSIS — M9902 Segmental and somatic dysfunction of thoracic region: Secondary | ICD-10-CM | POA: Diagnosis not present

## 2019-12-20 DIAGNOSIS — M9901 Segmental and somatic dysfunction of cervical region: Secondary | ICD-10-CM | POA: Diagnosis not present

## 2019-12-20 DIAGNOSIS — M546 Pain in thoracic spine: Secondary | ICD-10-CM | POA: Diagnosis not present

## 2019-12-20 DIAGNOSIS — M9903 Segmental and somatic dysfunction of lumbar region: Secondary | ICD-10-CM | POA: Diagnosis not present

## 2019-12-25 DIAGNOSIS — M9902 Segmental and somatic dysfunction of thoracic region: Secondary | ICD-10-CM | POA: Diagnosis not present

## 2019-12-25 DIAGNOSIS — S134XXA Sprain of ligaments of cervical spine, initial encounter: Secondary | ICD-10-CM | POA: Diagnosis not present

## 2019-12-25 DIAGNOSIS — M9903 Segmental and somatic dysfunction of lumbar region: Secondary | ICD-10-CM | POA: Diagnosis not present

## 2019-12-25 DIAGNOSIS — M9901 Segmental and somatic dysfunction of cervical region: Secondary | ICD-10-CM | POA: Diagnosis not present

## 2019-12-25 DIAGNOSIS — M546 Pain in thoracic spine: Secondary | ICD-10-CM | POA: Diagnosis not present

## 2019-12-27 DIAGNOSIS — M9901 Segmental and somatic dysfunction of cervical region: Secondary | ICD-10-CM | POA: Diagnosis not present

## 2019-12-27 DIAGNOSIS — M9903 Segmental and somatic dysfunction of lumbar region: Secondary | ICD-10-CM | POA: Diagnosis not present

## 2019-12-27 DIAGNOSIS — S134XXA Sprain of ligaments of cervical spine, initial encounter: Secondary | ICD-10-CM | POA: Diagnosis not present

## 2019-12-27 DIAGNOSIS — M546 Pain in thoracic spine: Secondary | ICD-10-CM | POA: Diagnosis not present

## 2019-12-27 DIAGNOSIS — M9902 Segmental and somatic dysfunction of thoracic region: Secondary | ICD-10-CM | POA: Diagnosis not present

## 2019-12-28 ENCOUNTER — Other Ambulatory Visit: Payer: Self-pay

## 2019-12-28 ENCOUNTER — Ambulatory Visit (INDEPENDENT_AMBULATORY_CARE_PROVIDER_SITE_OTHER): Payer: Medicare Other | Admitting: Family Medicine

## 2019-12-28 ENCOUNTER — Encounter: Payer: Self-pay | Admitting: Family Medicine

## 2019-12-28 VITALS — BP 158/87 | HR 69 | Temp 98.1°F | Ht 65.5 in | Wt 156.4 lb

## 2019-12-28 DIAGNOSIS — F411 Generalized anxiety disorder: Secondary | ICD-10-CM

## 2019-12-28 DIAGNOSIS — E559 Vitamin D deficiency, unspecified: Secondary | ICD-10-CM

## 2019-12-28 DIAGNOSIS — Z79899 Other long term (current) drug therapy: Secondary | ICD-10-CM | POA: Diagnosis not present

## 2019-12-28 DIAGNOSIS — E890 Postprocedural hypothyroidism: Secondary | ICD-10-CM

## 2019-12-28 DIAGNOSIS — I10 Essential (primary) hypertension: Secondary | ICD-10-CM | POA: Diagnosis not present

## 2019-12-28 DIAGNOSIS — M5442 Lumbago with sciatica, left side: Secondary | ICD-10-CM

## 2019-12-28 DIAGNOSIS — E7800 Pure hypercholesterolemia, unspecified: Secondary | ICD-10-CM

## 2019-12-28 MED ORDER — AMLODIPINE BESYLATE 5 MG PO TABS: ORAL_TABLET | ORAL | 3 refills | Status: DC

## 2019-12-28 MED ORDER — ROSUVASTATIN CALCIUM 40 MG PO TABS: ORAL_TABLET | ORAL | 3 refills | Status: DC

## 2019-12-28 MED ORDER — METOPROLOL TARTRATE 50 MG PO TABS: ORAL_TABLET | ORAL | 3 refills | Status: DC

## 2019-12-28 MED ORDER — PREDNISONE 10 MG (21) PO TBPK: ORAL_TABLET | ORAL | 0 refills | Status: DC

## 2019-12-28 MED ORDER — VITAMIN D (ERGOCALCIFEROL) 1.25 MG (50000 UNIT) PO CAPS: 50000.0000 [IU] | ORAL_CAPSULE | ORAL | 3 refills | Status: DC

## 2019-12-28 MED ORDER — LORAZEPAM 1 MG PO TABS: 0.5000 mg | ORAL_TABLET | Freq: Every day | ORAL | 1 refills | Status: DC | PRN

## 2019-12-28 MED ORDER — BUSPIRONE HCL 15 MG PO TABS: 30.0000 mg | ORAL_TABLET | Freq: Two times a day (BID) | ORAL | 3 refills | Status: DC

## 2019-12-28 MED ORDER — LOSARTAN POTASSIUM 100 MG PO TABS: 100.0000 mg | ORAL_TABLET | Freq: Every day | ORAL | 3 refills | Status: DC

## 2019-12-28 MED ORDER — FENOFIBRATE 160 MG PO TABS: ORAL_TABLET | ORAL | 1 refills | Status: DC

## 2019-12-28 NOTE — Progress Notes (Signed)
Subjective: CC:f/u thyroid, HTN, GAD PCP: [name], DO YIF:OYDXAJO [name] is a 75 y.o. female presenting to clinic today for:  1. Hypothyroidism History: Onset after resection of her thyroid cancer about 19 years ago  She reports compliance with Synthroid 125 mcg daily.  She has follow-up with her endocrinologist yearly for labs.  2. GAD Fairly well controlled with buspirone 30 mg twice daily.  She continues to use Ativan very sparingly for panic attacks, 1/4 tablet 2 x per week.  Denies any falls, visual or auditory hallucinations.  No respiratory depression.  No dizziness.  3. Low back pain Patient reports acute onset of back pain about 4 months ago.  She saw her chiropractor several times so far with yesterday having had plain films done.  He was concerned that she had some degenerative changes that may be causing her flare and was recommended that she talk to me about prednisone today.  She points to the area of her SI as the area of most pain.  Pain seems to be worse with standing.  4.  Hypertension with hyperlipidemia Patient reports compliance with losartan, metoprolol and amlodipine.  She needs renewals on this.  She also needs renewals on her fenofibrate and Crestor.  No reports of chest pain, shortness of breath, edema.  ROS: Per HPI  Allergies  Allergen Reactions  . Banana     Throat and ears itch,ears feel like they swell  . Bupropion Hcl     REACTION: paranoia  . Chantix [Varenicline]     paranoia  . Latex Rash  . Zetia [Ezetimibe]     Myalgia   Past Medical History:  Diagnosis Date  . CAD (coronary artery disease)    PCI of an occluded right coronary artery 2001. 70% LAD stenosis, 30% circumflex stenosis.  . Diverticulosis   . Hyperlipidemia   . Hypertension   . IBS (irritable bowel syndrome)   . Leukocytosis, unspecified 07/24/2012  . Myocardial infarction ([place]) 2001  . Need for prophylactic hormone replacement therapy (postmenopausal)   .  Osteopenia   . Osteoporosis   . Thyroid cancer [facility]) 2004   Dr. Clovia Cuff   . Thyroid nodule 2004  . Tobacco abuse     Current Outpatient Medications:  .  amLODipine (NORVASC) 5 MG tablet, TAKE ONE (1) TABLET EACH DAY, Disp: 90 tablet, Rfl: 3 .  aspirin 325 MG tablet, Take 325 mg by mouth daily., Disp: , Rfl:  .  azelastine (ASTELIN) 0.1 % nasal spray, Place 1 spray into both nostrils 2 (two) times daily., Disp: 30 mL, Rfl: 12 .  busPIRone (BUSPAR) 15 MG tablet, TAKE TWO TABLETS BY MOUTH TWICE DAILY, Disp: 360 tablet, Rfl: 0 .  conjugated estrogens (PREMARIN) vaginal cream, 1 application 3 times weekly, Disp: 42.5 g, Rfl: 12 .  fenofibrate 160 MG tablet, 1 tablet daily, Disp: 90 tablet, Rfl: 1 .  levothyroxine (SYNTHROID) 137 MCG tablet, Take 137 mcg by mouth daily before breakfast. Daily except 1/2 tablet on sunday, Disp: , Rfl:  .  LORazepam (ATIVAN) 1 MG tablet, Take 1 tablet (1 mg total) by mouth daily as needed for anxiety. 1/2- 1 tablet, Disp: 30 tablet, Rfl: 1 .  losartan (COZAAR) 100 MG tablet, TAKE ONE (1) TABLET EACH DAY, Disp: 90 tablet, Rfl: 0 .  Melatonin 5 MG TABS, Take by mouth as needed., Disp: , Rfl:  .  metoprolol tartrate (LOPRESSOR) 50 MG tablet, TAKE ONE (1) TABLET EACH DAY, Disp: 90 tablet, Rfl: 0 .  nitroGLYCERIN (NITROSTAT) 0.4 MG SL tablet, Place 1 tablet (0.4 mg total) under the tongue every 5 (five) minutes as needed for chest pain., Disp: 25 tablet, Rfl: 3 .  rosuvastatin (CRESTOR) 40 MG tablet, TAKE ONE (1) TABLET EACH DAY, Disp: 90 tablet, Rfl: 0 .  terbinafine (LAMISIL) 250 MG tablet, Take 1 tablet (250 mg total) by mouth daily., Disp: 90 tablet, Rfl: 0 .  Vitamin D, Ergocalciferol, (DRISDOL) 1.25 MG (50000 UNIT) CAPS capsule, Take 1 capsule (50,000 Units total) by mouth every 7 (seven) days., Disp: 12 capsule, Rfl: 3 Social History   Socioeconomic History  . Marital status: Married    Spouse name: Not on file  . Number of children: 2  . Years of education:  Not on file  . Highest education level: Not on file  Occupational History  . Occupation: Retired     Comment: Engineering geologist  Tobacco Use  . Smoking status: Current Every Day Smoker    Packs/day: 1.00    Years: 40.00    Pack years: 40.00    Types: Cigarettes  . Smokeless tobacco: Never Used  Vaping Use  . Vaping Use: Never used  Substance and Sexual Activity  . Alcohol use: No  . Drug use: No  . Sexual activity: Not Currently  Other Topics Concern  . Not on file  Social History Narrative   Dr.  Cyril Loosen -(surgeon) [phone]   Dr. Marilynne Halsted- Endocrinologist  [facility]    Social Determinants of Health   Financial Resource Strain:   . Difficulty of Paying Living Expenses: Not on file  Food Insecurity:   . Worried About Charity fundraiser in the Last Year: Not on file  . Ran Out of Food in the Last Year: Not on file  Transportation Needs:   . Lack of Transportation (Medical): Not on file  . Lack of Transportation (Non-Medical): Not on file  Physical Activity:   . Days of Exercise per Week: Not on file  . Minutes of Exercise per Session: Not on file  Stress:   . Feeling of Stress : Not on file  Social Connections:   . Frequency of Communication with Friends and Family: Not on file  . Frequency of Social Gatherings with Friends and Family: Not on file  . Attends Religious Services: Not on file  . Active Member of Clubs or Organizations: Not on file  . Attends Archivist Meetings: Not on file  . Marital Status: Not on file  Intimate Partner Violence:   . Fear of Current or Ex-Partner: Not on file  . Emotionally Abused: Not on file  . Physically Abused: Not on file  . Sexually Abused: Not on file   Family History  Problem Relation Age of Onset  . Coronary artery disease Father   . Heart disease Father   . Cirrhosis Mother   . Alcohol abuse Mother   . Cancer Other        family history   . Hypertension Maternal Grandmother     Objective: Office vital  signs reviewed. There were no vitals taken for this visit.  Physical Examination:  General: Awake, alert, well nourished, No acute distress HEENT: Normal; Well-healed scar at the base of her neck.  No exophthalmos Cardio: regular rate and rhythm, S1S2 heard, no murmurs appreciated Pulm: clear to auscultation bilaterally, no wheezes, rhonchi or rales; normal work of breathing on room air Extremities: warm, well perfused, No edema, cyanosis or clubbing; +2 pulses bilaterally Neuro: No tremor  Psych: Mood stable, speech normal, affect appropriate, pleasant and interactive Skin: Normal temperature.  On her left middle finger, she does have a small cyst near the cuticle.  She also has subsequent indentation of her nail.  Right great toenails with onychomycotic change. Depression screen [facility] 2/9 07/02/2019 12/13/2018 05/30/2018 11/29/2017 05/30/2017  Decreased Interest 0 0 0 0 1  Down, Depressed, Hopeless 1 - 1 1 1   PHQ - 2 Score 1 0 1 1 2   Altered sleeping - - - - 3  Tired, decreased energy - 0 - - 3  Change in appetite - 0 - - 2  Feeling bad or failure about yourself  - 0 - - 2  Trouble concentrating - 0 - - 1  Moving slowly or fidgety/restless - 0 - - 0  Suicidal thoughts - 0 - - 0  PHQ-9 Score - - - - 13  Difficult doing work/chores - Not difficult at all - - Somewhat difficult   GAD 7 : Generalized Anxiety Score 07/02/2019 12/13/2018  Nervous, Anxious, on Edge 2 1  Control/stop worrying 1 1  Worry too much - different things 2 1  Trouble relaxing 1 1  Restless 1 -  Easily annoyed or irritable 0 1  Afraid - awful might happen 0 1  Total GAD 7 Score 7 -  Anxiety Difficulty Not difficult at all Somewhat difficult   Assessment/ Plan: 74 y.o. female   1. Acute left-sided low back pain with left-sided sciatica I will be glad to prescribe a prednisone Dosepak for this patient.  I did discuss that if symptoms do not significantly improved however with chiropractic therapy and oral  corticosteroids, may need to consider formal referral to PT and/or orthopedics.  She seemed amenable to this plan.  She will follow-up as needed - predniSONE (STERAPRED UNI-PAK 21 TAB) 10 MG (21) TBPK tablet; As directed x 6 days  Dispense: 21 tablet; Refill: 0  2. Postoperative hypothyroidism Asymptomatic.  Check thyroid panel - Thyroid Panel With TSH  3. Essential hypertension Controlled.  Refill sent - losartan (COZAAR) 100 MG tablet; Take 1 tablet (100 mg total) by mouth daily.  Dispense: 90 tablet; Refill: 3 - metoprolol tartrate (LOPRESSOR) 50 MG tablet; TAKE ONE (1) TABLET EACH DAY  Dispense: 90 tablet; Refill: 3 - Basic Metabolic Panel  4. Pure hypercholesterolemia Continue cholesterol medicines these have been renewed - fenofibrate 160 MG tablet; 1 tablet daily  Dispense: 90 tablet; Refill: 1 - rosuvastatin (CRESTOR) 40 MG tablet; TAKE ONE (1) TABLET EACH DAY  Dispense: 90 tablet; Refill: 3  5. GAD (generalized anxiety disorder) Stable with as needed use of Ativan.  Renewal has been sent.  UDS and CSC were updated as per office policy.  National narcotic database was reviewed and there were no red flags. - ToxASSURE Select 13 (MW), Urine - LORazepam (ATIVAN) 1 MG tablet; Take 0.5-1 tablets (0.5-1 mg total) by mouth daily as needed for anxiety.  Dispense: 30 tablet; Refill: 1  6. Controlled substance agreement signed - ToxASSURE Select 13 (MW), Urine   Orders Placed This Encounter  Procedures  . ToxASSURE Select 13 (MW), Urine  . Basic Metabolic Panel  . Thyroid Panel With TSH   No orders of the defined types were placed in this encounter.    [name], DO [place] [phone]

## 2019-12-29 LAB — BASIC METABOLIC PANEL
BUN/Creatinine Ratio: 17 (ref 12–28)
BUN: 14 mg/dL (ref 8–27)
CO2: 20 mmol/L (ref 20–29)
Calcium: 9.6 mg/dL (ref 8.7–10.3)
Chloride: 104 mmol/L (ref 96–106)
Creatinine, Ser: 0.82 mg/dL (ref 0.57–1.00)
GFR calc Af Amer: 82 mL/min/{1.73_m2} (ref >59)
GFR calc non Af Amer: 71 mL/min/{1.73_m2} (ref >59)
Glucose: 101 mg/dL — ABNORMAL HIGH (ref 65–99)
Potassium: 4 mmol/L (ref 3.5–5.2)
Sodium: 140 mmol/L (ref 134–144)

## 2019-12-29 LAB — THYROID PANEL WITH TSH
Free Thyroxine Index: 4.6 (ref 1.2–4.9)
T3 Uptake Ratio: 33 % (ref 24–39)
T4, Total: 13.8 ug/dL — ABNORMAL HIGH (ref 4.5–12.0)
TSH: 0.713 u[IU]/mL (ref 0.450–4.500)

## 2019-12-31 DIAGNOSIS — M9903 Segmental and somatic dysfunction of lumbar region: Secondary | ICD-10-CM | POA: Diagnosis not present

## 2019-12-31 DIAGNOSIS — M9902 Segmental and somatic dysfunction of thoracic region: Secondary | ICD-10-CM | POA: Diagnosis not present

## 2019-12-31 DIAGNOSIS — M546 Pain in thoracic spine: Secondary | ICD-10-CM | POA: Diagnosis not present

## 2019-12-31 DIAGNOSIS — M9901 Segmental and somatic dysfunction of cervical region: Secondary | ICD-10-CM | POA: Diagnosis not present

## 2019-12-31 DIAGNOSIS — S134XXA Sprain of ligaments of cervical spine, initial encounter: Secondary | ICD-10-CM | POA: Diagnosis not present

## 2020-01-01 ENCOUNTER — Ambulatory Visit: Payer: Medicare Other | Admitting: Family Medicine

## 2020-01-01 LAB — TOXASSURE SELECT 13 (MW), URINE

## 2020-01-03 DIAGNOSIS — M9901 Segmental and somatic dysfunction of cervical region: Secondary | ICD-10-CM | POA: Diagnosis not present

## 2020-01-03 DIAGNOSIS — S134XXA Sprain of ligaments of cervical spine, initial encounter: Secondary | ICD-10-CM | POA: Diagnosis not present

## 2020-01-03 DIAGNOSIS — M546 Pain in thoracic spine: Secondary | ICD-10-CM | POA: Diagnosis not present

## 2020-01-03 DIAGNOSIS — M9903 Segmental and somatic dysfunction of lumbar region: Secondary | ICD-10-CM | POA: Diagnosis not present

## 2020-01-03 DIAGNOSIS — M9902 Segmental and somatic dysfunction of thoracic region: Secondary | ICD-10-CM | POA: Diagnosis not present

## 2020-01-07 DIAGNOSIS — S134XXA Sprain of ligaments of cervical spine, initial encounter: Secondary | ICD-10-CM | POA: Diagnosis not present

## 2020-01-07 DIAGNOSIS — M9901 Segmental and somatic dysfunction of cervical region: Secondary | ICD-10-CM | POA: Diagnosis not present

## 2020-01-07 DIAGNOSIS — M9903 Segmental and somatic dysfunction of lumbar region: Secondary | ICD-10-CM | POA: Diagnosis not present

## 2020-01-07 DIAGNOSIS — M9902 Segmental and somatic dysfunction of thoracic region: Secondary | ICD-10-CM | POA: Diagnosis not present

## 2020-01-07 DIAGNOSIS — M546 Pain in thoracic spine: Secondary | ICD-10-CM | POA: Diagnosis not present

## 2020-01-08 ENCOUNTER — Other Ambulatory Visit: Payer: Self-pay

## 2020-01-08 ENCOUNTER — Encounter: Payer: Self-pay | Admitting: Nurse Practitioner

## 2020-01-08 ENCOUNTER — Ambulatory Visit (INDEPENDENT_AMBULATORY_CARE_PROVIDER_SITE_OTHER): Payer: Medicare Other | Admitting: Nurse Practitioner

## 2020-01-08 VITALS — BP 155/76 | HR 77 | Temp 97.5°F | Resp 20 | Ht 65.5 in | Wt 154.0 lb

## 2020-01-08 DIAGNOSIS — M5442 Lumbago with sciatica, left side: Secondary | ICD-10-CM | POA: Insufficient documentation

## 2020-01-08 MED ORDER — IBUPROFEN 600 MG PO TABS: 600.0000 mg | ORAL_TABLET | Freq: Three times a day (TID) | ORAL | 0 refills | Status: DC | PRN

## 2020-01-08 MED ORDER — METHYLPREDNISOLONE ACETATE 40 MG/ML IJ SUSP
40.0000 mg | Freq: Once | INTRAMUSCULAR | Status: AC
  Administered 2020-01-08: 40 mg via INTRAMUSCULAR

## 2020-01-08 MED ORDER — DICLOFENAC SODIUM 1 % EX GEL: 2.0000 g | Freq: Four times a day (QID) | CUTANEOUS | 1 refills | Status: DC

## 2020-01-08 NOTE — Patient Instructions (Signed)
Chronic Back Pain When back pain lasts longer than 3 months, it is called chronic back pain. Pain may get worse at certain times (flare-ups). There are things you can do at home to manage your pain. Follow these instructions at home: Activity      Avoid bending and other activities that make pain worse.  When standing: ? Keep your upper back and neck straight. ? Keep your shoulders pulled back. ? Avoid slouching.  When sitting: ? Keep your back straight. ? Relax your shoulders. Do not round your shoulders or pull them backward.  Do not sit or stand in one place for long periods of time.  Take short rest breaks during the day. Lying down or standing is usually better than sitting. Resting can help relieve pain.  When sitting or lying down for a long time, do some mild activity or stretching. This will help to prevent stiffness and pain.  Get regular exercise. Ask your doctor what activities are safe for you.  Do not lift anything that is heavier than 10 lb (4.5 kg). To prevent injury when you lift things: ? Bend your knees. ? Keep the weight close to your body. ? Avoid twisting. Managing pain  If told, put ice on the painful area. Your doctor may tell you to use ice for 24-48 hours after a flare-up starts. ? Put ice in a plastic bag. ? Place a towel between your skin and the bag. ? Leave the ice on for 20 minutes, 2-3 times a day.  If told, put heat on the painful area as often as told by your doctor. Use the heat source that your doctor recommends, such as a moist heat pack or a heating pad. ? Place a towel between your skin and the heat source. ? Leave the heat on for 20-30 minutes. ? Remove the heat if your skin turns bright red. This is especially important if you are unable to feel pain, heat, or cold. You may have a greater risk of getting burned.  Soaklx in a warm bath. This can help relieve pain.  Take over-the-counter and prescription medicines only as told by  your doctor. General instructions  Sleep on a firm mattress. Try lying on your side with your knees slightly bent. If you lie on your back, put a pillow under your knees.  Keep all follow-up visits as told by your doctor. This is important. Contact a doctor if:  You have pain that does not get better with rest or medicine. Get help right away if:  One or both of your arms or legs feel weak.  One or both of your arms or legs lose feeling (numbness).  You have trouble controlling when you poop (bowel movement) or pee (urinate).  You feel sick to your stomach (nauseous).  You throw up (vomit).  You have belly (abdominal) pain.  You have shortness of breath.  You pass out (faint). Summary  When back pain lasts longer than 3 months, it is called chronic back pain.  Pain may get worse at certain times (flare-ups).  Use ice and heat as told by your doctor. Your doctor may tell you to use ice after flare-ups. This information is not intended to replace advice given to you by your health care provider. Make sure you discuss any questions you have with your health care provider. Document Revised: 07/27/2018 Document Reviewed: 11/18/2016 Elsevier Patient Education  2020 [employer].

## 2020-01-08 NOTE — Addendum Note (Signed)
Addended by: Michaela Corner on: 01/08/2020 01:45 PM   Modules accepted: Orders

## 2020-01-08 NOTE — Assessment & Plan Note (Signed)
Patient is a 74 year old female who presents to clinic for acute left-sided lower back pain with left-sided sciatica.  Patient is reporting pain off and on over 2 to 3 months.  Patient reports pain is unchanged and getting worse.  Patient has used steroids, anti-inflammatory in the past with no therapeutic effect.  Patient has had several chiropractic visits and pain continues to remain unchanged.  Patient's negative includes bowel incontinence, tingling, numbness, no fevers,. Patient has a history of back surgery 20 years ago. Provided education to patient with printed handouts given. Referral to orthopedic completed. Advised patient to use anti-inflammatory ibuprofen as needed, Voltaren gel, Depo-Medrol shot given in office.  Follow-up with worsening or unresolved symptoms.

## 2020-01-08 NOTE — Progress Notes (Signed)
Acute Office Visit  Subjective:    Patient ID: Madison [name], female    DOB: 12-Jul-1945, 74 y.o.   MRN: [id number]  Chief Complaint  Patient presents with  . Back Pain    Back Pain This is a recurrent problem. The current episode started more than 1 month ago. The problem occurs constantly. The problem is unchanged. The pain is present in the lumbar spine. The quality of the pain is described as aching. The pain is at a severity of 8/10. The pain is the same all the time. The symptoms are aggravated by position, standing and bending. Stiffness is present all day. Associated symptoms include leg pain and weakness. Pertinent negatives include no bowel incontinence, chest pain or numbness. She has tried analgesics and chiropractic manipulation for the symptoms. The treatment provided no relief.   Past Medical History:  Diagnosis Date  . CAD (coronary artery disease)    PCI of an occluded right coronary artery 2001. 70% LAD stenosis, 30% circumflex stenosis.  . Diverticulosis   . Hyperlipidemia   . Hypertension   . IBS (irritable bowel syndrome)   . Leukocytosis, unspecified 07/24/2012  . Myocardial infarction ([place]) 2001  . Need for prophylactic hormone replacement therapy (postmenopausal)   . Osteopenia   . Osteoporosis   . Thyroid cancer [facility]) 2004   Dr. Clovia Cuff   . Thyroid nodule 2004  . Tobacco abuse     Past Surgical History:  Procedure Laterality Date  . APPENDECTOMY  1955  . BACK SURGERY  06/2000  . CHOLECYSTECTOMY  07/2002  . EYE SURGERY Left 05/16/2017   cataract  . LIPOMA EXCISION Right 08/2003   r arm   . peserie    . THYROIDECTOMY Bilateral 02/2003    Family History  Problem Relation Age of Onset  . Coronary artery disease Father   . Heart disease Father   . Cirrhosis Mother   . Alcohol abuse Mother   . Cancer Other        family history   . Hypertension Maternal Grandmother       Outpatient Medications Prior to Visit  Medication Sig Dispense Refill    . amLODipine (NORVASC) 5 MG tablet TAKE ONE (1) TABLET EACH DAY 90 tablet 3  . aspirin 325 MG tablet Take 325 mg by mouth daily.    . busPIRone (BUSPAR) 15 MG tablet Take 2 tablets (30 mg total) by mouth 2 (two) times daily. 360 tablet 3  . conjugated estrogens (PREMARIN) vaginal cream 1 application 3 times weekly 42.5 g 12  . fenofibrate 160 MG tablet 1 tablet daily 90 tablet 1  . levothyroxine (SYNTHROID) 125 MCG tablet Take 125 mcg by mouth daily before breakfast.    . LORazepam (ATIVAN) 1 MG tablet Take 0.5-1 tablets (0.5-1 mg total) by mouth daily as needed for anxiety. 30 tablet 1  . losartan (COZAAR) 100 MG tablet Take 1 tablet (100 mg total) by mouth daily. 90 tablet 3  . Melatonin 5 MG TABS Take by mouth as needed.    . metoprolol tartrate (LOPRESSOR) 50 MG tablet TAKE ONE (1) TABLET EACH DAY 90 tablet 3  . nitroGLYCERIN (NITROSTAT) 0.4 MG SL tablet Place 1 tablet (0.4 mg total) under the tongue every 5 (five) minutes as needed for chest pain. 25 tablet 3  . rosuvastatin (CRESTOR) 40 MG tablet TAKE ONE (1) TABLET EACH DAY 90 tablet 3  . Vitamin D, Ergocalciferol, (DRISDOL) 1.25 MG (50000 UNIT) CAPS capsule Take 1 capsule (50,000  Units total) by mouth every 7 (seven) days. 12 capsule 3  . predniSONE (STERAPRED UNI-PAK 21 TAB) 10 MG (21) TBPK tablet As directed x 6 days 21 tablet 0   No facility-administered medications prior to visit.    Allergies  Allergen Reactions  . Banana     Throat and ears itch,ears feel like they swell  . Bupropion Hcl     REACTION: paranoia  . Chantix [Varenicline]     paranoia  . Latex Rash  . Zetia [Ezetimibe]     Myalgia    Review of Systems  Cardiovascular: Negative for chest pain.  Gastrointestinal: Negative for bowel incontinence.  Musculoskeletal: Positive for back pain.  Neurological: Positive for weakness. Negative for numbness.  All other systems reviewed and are negative.      Objective:    Physical Exam Constitutional:       Appearance: Normal appearance.  HENT:     Head: Normocephalic.  Eyes:     Conjunctiva/sclera: Conjunctivae normal.  Cardiovascular:     Rate and Rhythm: Normal rate and regular rhythm.     Pulses: Normal pulses.     Heart sounds: Normal heart sounds.  Pulmonary:     Effort: Pulmonary effort is normal.     Breath sounds: Normal breath sounds.  Abdominal:     General: Bowel sounds are normal.  Musculoskeletal:        General: Tenderness present.     Comments: Right lower back pain  Skin:    General: Skin is warm.  Neurological:     Mental Status: She is alert and oriented to person, place, and time.  Psychiatric:        Mood and Affect: Mood normal.        Behavior: Behavior normal.     BP (!) 155/76   Pulse 77   Temp (!) 97.5 F (36.4 C) (Temporal)   Resp 20   Ht 5' 5.5" (1.664 m)   Wt 154 lb (69.9 kg)   SpO2 95%   BMI 25.24 kg/m  Wt Readings from Last 3 Encounters:  01/08/20 154 lb (69.9 kg)  12/28/19 156 lb 6.4 oz (70.9 kg)  07/02/19 156 lb (70.8 kg)    Health Maintenance Due  Topic Date Due  . TETANUS/TDAP  05/25/2017  . COLON CANCER SCREENING ANNUAL FOBT  12/03/2018  . INFLUENZA VACCINE  11/18/2019     Lab Results  Component Value Date   TSH 0.713 12/28/2019   Lab Results  Component Value Date   WBC 11.3 (H) 05/30/2018   HGB 15.8 05/30/2018   HCT 46.8 (H) 05/30/2018   MCV 90 05/30/2018   PLT 256 05/30/2018   Lab Results  Component Value Date   NA 140 12/28/2019   K 4.0 12/28/2019   CO2 20 12/28/2019   GLUCOSE 101 (H) 12/28/2019   BUN 14 12/28/2019   CREATININE 0.82 12/28/2019   BILITOT 0.2 08/13/2019   ALKPHOS 61 08/13/2019   AST 20 08/13/2019   ALT 15 08/13/2019   PROT 6.6 08/13/2019   ALBUMIN 4.4 08/13/2019   CALCIUM 9.6 12/28/2019   Lab Results  Component Value Date   CHOL 113 07/02/2019   Lab Results  Component Value Date   HDL 28 (L) 07/02/2019   Lab Results  Component Value Date   LDLCALC 63 07/02/2019   Lab Results    Component Value Date   TRIG 123 07/02/2019   Lab Results  Component Value Date   CHOLHDL 4.0 07/02/2019  Lab Results  Component Value Date   HGBA1C 5.7 01/17/2014       Assessment & Plan:   Problem List Items Addressed This Visit      Other   Acute left-sided low back pain with left-sided sciatica - Primary    Patient is a 74 year old female who presents to clinic for acute left-sided lower back pain with left-sided sciatica.  Patient is reporting pain off and on over 2 to 3 months.  Patient reports pain is unchanged and getting worse.  Patient has used steroids, anti-inflammatory in the past with no therapeutic effect.  Patient has had several chiropractic visits and pain continues to remain unchanged.  Patient's negative includes bowel incontinence, tingling, numbness, no fevers,. Patient has a history of back surgery 20 years ago. Provided education to patient with printed handouts given. Referral to orthopedic completed. Advised patient to use anti-inflammatory ibuprofen as needed, Voltaren gel, Depo-Medrol shot given in office.  Follow-up with worsening or unresolved symptoms.      Relevant Orders   Ambulatory referral to Orthopedic Surgery          Ivy Lynn, NP

## 2020-01-09 ENCOUNTER — Ambulatory Visit: Payer: Medicare Other

## 2020-01-10 DIAGNOSIS — M546 Pain in thoracic spine: Secondary | ICD-10-CM | POA: Diagnosis not present

## 2020-01-10 DIAGNOSIS — M9901 Segmental and somatic dysfunction of cervical region: Secondary | ICD-10-CM | POA: Diagnosis not present

## 2020-01-10 DIAGNOSIS — S134XXA Sprain of ligaments of cervical spine, initial encounter: Secondary | ICD-10-CM | POA: Diagnosis not present

## 2020-01-10 DIAGNOSIS — M9902 Segmental and somatic dysfunction of thoracic region: Secondary | ICD-10-CM | POA: Diagnosis not present

## 2020-01-10 DIAGNOSIS — M9903 Segmental and somatic dysfunction of lumbar region: Secondary | ICD-10-CM | POA: Diagnosis not present

## 2020-01-14 DIAGNOSIS — M9902 Segmental and somatic dysfunction of thoracic region: Secondary | ICD-10-CM | POA: Diagnosis not present

## 2020-01-14 DIAGNOSIS — S134XXA Sprain of ligaments of cervical spine, initial encounter: Secondary | ICD-10-CM | POA: Diagnosis not present

## 2020-01-14 DIAGNOSIS — M546 Pain in thoracic spine: Secondary | ICD-10-CM | POA: Diagnosis not present

## 2020-01-14 DIAGNOSIS — M9903 Segmental and somatic dysfunction of lumbar region: Secondary | ICD-10-CM | POA: Diagnosis not present

## 2020-01-14 DIAGNOSIS — M9901 Segmental and somatic dysfunction of cervical region: Secondary | ICD-10-CM | POA: Diagnosis not present

## 2020-01-17 ENCOUNTER — Other Ambulatory Visit: Payer: Self-pay

## 2020-01-17 ENCOUNTER — Ambulatory Visit: Payer: Self-pay

## 2020-01-17 ENCOUNTER — Ambulatory Visit (INDEPENDENT_AMBULATORY_CARE_PROVIDER_SITE_OTHER): Payer: Medicare Other | Admitting: Orthopaedic Surgery

## 2020-01-17 DIAGNOSIS — M5442 Lumbago with sciatica, left side: Secondary | ICD-10-CM

## 2020-01-18 NOTE — Progress Notes (Signed)
Office Visit Note   Patient: Madison [name]           Date of Birth: 1945-05-31           MRN: [id number] Visit Date: 01/17/2020              Requested by: Ivy Lynn, NP [address] [place],  [place] [address] PCP: [name], DO   Assessment & Plan: Visit Diagnoses:  1. Left-sided low back pain with left-sided sciatica, unspecified chronicity     Plan: Patient had at the end of visit she has appointment with Dr. Arnoldo Morale coming up in a few days.  I discussed with her neck step would be MRI scan.  She can see Dr. Arnoldo Morale and then decide whether she wants Dr. Arnoldo Morale to order the MRI or wants to call back and have [place] [facility] order for MRI scan.  Follow-Up Instructions: No follow-ups on file.   Orders:  Orders Placed This Encounter  Procedures   XR Lumb Spine Flex&Ext Only   No orders of the defined types were placed in this encounter.     Procedures: No procedures performed   Clinical Data: No additional findings.   Subjective: Chief Complaint  Patient presents with   Lower Back - Pain    HPI 74 year old female seen with 3 and half months history of back pain on the left side.  She had previous surgery by Dr. Joya Salm 20 years ago.  She states her pain is been constant at times sometimes she gets occasional relief she has increased pain with sitting problems with standing in 1 position she does better if she is moving around.  She has pain when she wakes up pain radiates down her left leg.  No numbness or tingling in her foot but states the burning in her thigh radiates down to the calf.  No groin pain.  She went to chiropractor without relief.  Dr. Sandi Carne refer patient to me for evaluation with a cover letter dated 01/10/2020.  Review of Systems patient had previous appendectomy gallbladder.  Myocardial infarction with stent placement 2001.  Low back surgery 2002.  Cataract surgery thyroid removal.  Positive for anxiety osteopenia hypertension thyroid  conditions.  Patient takes thyroid supplement lorazepam and antidepressant medication she has been on some ibuprofen for her back recently.  Other systems are noncontributory to HPI.   Objective: Vital Signs: There were no vitals taken for this visit.  Physical Exam Constitutional:      Appearance: She is well-developed.  HENT:     Head: Normocephalic.     Right Ear: External ear normal.     Left Ear: External ear normal.  Eyes:     Pupils: Pupils are equal, round, and reactive to light.  Neck:     Thyroid: No thyromegaly.     Trachea: No tracheal deviation.  Cardiovascular:     Rate and Rhythm: Normal rate.  Pulmonary:     Effort: Pulmonary effort is normal.  Abdominal:     Palpations: Abdomen is soft.  Skin:    General: Skin is warm and dry.  Neurological:     Mental Status: She is alert and oriented to person, place, and time.  Psychiatric:        Behavior: Behavior normal.     Ortho Exam well-healed lumbar incision L5-S1.  She has some sciatic notch tenderness on the left some pain with straight leg raising at 90 degrees negative logroll to the  hips.  Anterior tib EHL is intact.  Specialty Comments:  No specialty comments available.  Imaging: No results found.   PMFS History: Patient Active Problem List   Diagnosis Date Noted   Acute left-sided low back pain with left-sided sciatica 01/08/2020   Educated about COVID-19 virus infection 03/19/2019   Coronary artery disease involving native coronary artery of native heart without angina pectoris 03/08/2018   Dyslipidemia 03/08/2018   GAD (generalized anxiety disorder) 05/19/2017   Chest pain 02/23/2017   RBBB 97/84/7841   Metabolic syndrome 28/20/8138   Leukocytosis 01/04/2013   TOBACCO ABUSE 10/29/2009   THYROID CANCER 11/30/2007   Hyperlipidemia 11/30/2007   HTN (hypertension) 11/30/2007   Coronary atherosclerosis 11/30/2007   IRRITABLE BOWEL SYNDROME 11/30/2007   Osteopenia 11/30/2007    Past Medical History:  Diagnosis Date   CAD (coronary artery disease)    PCI of an occluded right coronary artery 2001. 70% LAD stenosis, 30% circumflex stenosis.   Diverticulosis    Hyperlipidemia    Hypertension    IBS (irritable bowel syndrome)    Leukocytosis, unspecified 07/24/2012   Myocardial infarction [facility]) 2001   Need for prophylactic hormone replacement therapy (postmenopausal)    Osteopenia    Osteoporosis    Thyroid cancer ([place]) 2004   Dr. Clovia Cuff    Thyroid nodule 2004   Tobacco abuse     Family History  Problem Relation Age of Onset   Coronary artery disease Father    Heart disease Father    Cirrhosis Mother    Alcohol abuse Mother    Cancer Other        family history    Hypertension Maternal Grandmother     Past Surgical History:  Procedure Laterality Date   APPENDECTOMY  1955   BACK SURGERY  06/2000   CHOLECYSTECTOMY  07/2002   EYE SURGERY Left 05/16/2017   cataract   LIPOMA EXCISION Right 08/2003   r arm    peserie     THYROIDECTOMY Bilateral 02/2003   Social History   Occupational History   Occupation: Retired     Comment: Engineering geologist  Tobacco Use   Smoking status: Current Every Day Smoker    Packs/day: 1.00    Years: 40.00    Pack years: 40.00    Types: Cigarettes   Smokeless tobacco: Never Used  Scientific laboratory technician Use: Never used  Substance and Sexual Activity   Alcohol use: No   Drug use: No   Sexual activity: Not Currently

## 2020-01-21 ENCOUNTER — Other Ambulatory Visit: Payer: Self-pay

## 2020-01-21 ENCOUNTER — Ambulatory Visit: Payer: Medicare Other

## 2020-01-21 ENCOUNTER — Ambulatory Visit (INDEPENDENT_AMBULATORY_CARE_PROVIDER_SITE_OTHER): Payer: Medicare Other

## 2020-01-21 DIAGNOSIS — Z23 Encounter for immunization: Secondary | ICD-10-CM

## 2020-01-22 DIAGNOSIS — M5442 Lumbago with sciatica, left side: Secondary | ICD-10-CM | POA: Diagnosis not present

## 2020-02-05 DIAGNOSIS — M545 Low back pain, unspecified: Secondary | ICD-10-CM | POA: Diagnosis not present

## 2020-02-05 DIAGNOSIS — M5442 Lumbago with sciatica, left side: Secondary | ICD-10-CM | POA: Diagnosis not present

## 2020-02-07 ENCOUNTER — Other Ambulatory Visit: Payer: Self-pay | Admitting: Family Medicine

## 2020-02-07 DIAGNOSIS — Z1231 Encounter for screening mammogram for malignant neoplasm of breast: Secondary | ICD-10-CM

## 2020-02-12 DIAGNOSIS — M5116 Intervertebral disc disorders with radiculopathy, lumbar region: Secondary | ICD-10-CM | POA: Diagnosis not present

## 2020-02-21 ENCOUNTER — Other Ambulatory Visit: Payer: Self-pay | Admitting: Neurosurgery

## 2020-02-26 ENCOUNTER — Other Ambulatory Visit: Payer: Self-pay | Admitting: Neurosurgery

## 2020-03-05 ENCOUNTER — Ambulatory Visit: Payer: Medicare Other

## 2020-03-06 DIAGNOSIS — H15001 Unspecified scleritis, right eye: Secondary | ICD-10-CM | POA: Diagnosis not present

## 2020-03-06 DIAGNOSIS — Z961 Presence of intraocular lens: Secondary | ICD-10-CM | POA: Diagnosis not present

## 2020-03-06 DIAGNOSIS — H02051 Trichiasis without entropian right upper eyelid: Secondary | ICD-10-CM | POA: Diagnosis not present

## 2020-03-06 DIAGNOSIS — H43811 Vitreous degeneration, right eye: Secondary | ICD-10-CM | POA: Diagnosis not present

## 2020-03-17 NOTE — Pre-Procedure Instructions (Signed)
Tamecka H Oddo  03/17/2020      Your procedure is scheduled on Thursday, December 2nd.  Report to [facility], Main Entrance or Entrance "A" at 1100 A.M.                Your surgery or procedure is scheduled to begin at 1:00 PM   Call this number if you have problems the morning of surgery: [phone]  This is the number for the Pre- Surgical Desk.                For any other questions, please call [phone], Monday - Friday 8 AM - 4 PM.   Remember:  Do not eat or drink after midnight.  Take these medicines the morning of surgery with A SIP OF WATER : amLODipine (NORVASC) busPIRone (BUSPAR) fenofibrate      gabapentin (NEURONTIN)     levothyroxine (SYNTHROID)     metoprolol tartrate (LOPRESSOR)     rosuvastatin (CRESTOR)  Take if needed: LORazepam (ATIVAN) nitroGLYCERIN (NITROSTAT)    Follow Dr. Arnoldo Morale instructions regarding Aspirin.   1 Week prior to surgery STOP taking Aspirin, Aspirin Products (Goody Powder, Excedrin Migraine), Ibuprofen (Advil), Naproxen (Aleve), Vitamins and Herbal Products (ie Fish Oil).    Special instructions:  DO NOT Smoke within 24 hours prior to surgery  [facility]- Preparing For Surgery  Before surgery, you can play an important role. Because skin is not sterile, your skin needs to be as free of germs as possible. You can reduce the number of germs on your skin by washing with CHG (chlorahexidine gluconate) Soap before surgery.  CHG is an antiseptic cleaner which kills germs and bonds with the skin to continue killing germs even after washing.    Oral Hygiene is also important to reduce your risk of infection.  Remember - BRUSH YOUR TEETH THE MORNING OF SURGERY WITH YOUR REGULAR TOOTHPASTE  Please do not use if you have an allergy to CHG or antibacterial soaps. If your skin becomes reddened/irritated stop using the CHG.  Do not shave (including legs and underarms) for at least 48 hours prior to first CHG shower. It is OK to  shave your face.  Please follow these instructions carefully.   1. Shower the NIGHT BEFORE SURGERY and the MORNING OF SURGERY with CHG.   2. If you chose to wash your hair, wash your hair first as usual with your normal shampoo.  3. After you shampoo, wash your face and private area with the soap you use at home, then rinse your hair and body thoroughly to remove the shampoo and soap.  4. Use CHG as you would any other liquid soap. You can apply CHG directly to the skin and wash gently with a scrungie or a clean washcloth.   5. Apply the CHG Soap to your body ONLY FROM THE NECK DOWN.  Do not use on open wounds or open sores. Avoid contact with your eyes, ears, mouth and genitals (private parts).   6. Wash thoroughly, paying special attention to the area where your surgery will be performed.  7. Thoroughly rinse your body with warm water from the neck down.  8. DO NOT shower/wash with your normal soap after using and rinsing off the CHG Soap.  9. Pat yourself dry with a CLEAN TOWEL.  10. Wear CLEAN PAJAMAS to bed the night before surgery, wear comfortable clothes the morning of surgery  11. Place CLEAN SHEETS on your bed the night  of your first shower and DO NOT SLEEP WITH PETS.  Day of Surgery: Shower as instructed above. Do not apply any deodorants/lotions, powders or colognes.  Please wear clean clothes to the hospital/surgery center.   Remember to brush your teeth WITH YOUR REGULAR TOOTHPASTE.  Do not wear jewelry, make-up or nail polish.  Do not shave 48 hours prior to surgery.  Men may shave face and neck.  Do not bring valuables to the hospital.  [facility] is not responsible for any belongings or valuables.  Contacts, dentures or bridgework may not be worn into surgery.  Leave your suitcase in the car.  After surgery it may be brought to your room.  For patients admitted to the hospital, discharge time will be determined by your treatment team.  Patients discharged the  day of surgery will not be allowed to drive home.   Please read over the fact sheets that you were given.       +

## 2020-03-18 ENCOUNTER — Other Ambulatory Visit (HOSPITAL_COMMUNITY)
Admission: RE | Admit: 2020-03-18 | Discharge: 2020-03-18 | Disposition: A | Discharge status: Home / Self Care | Payer: Medicare Other | Source: Ambulatory Visit | Attending: Neurosurgery | Admitting: Neurosurgery

## 2020-03-18 ENCOUNTER — Encounter (HOSPITAL_COMMUNITY): Payer: Self-pay

## 2020-03-18 ENCOUNTER — Other Ambulatory Visit: Payer: Self-pay

## 2020-03-18 ENCOUNTER — Encounter (HOSPITAL_COMMUNITY)
Admission: RE | Admit: 2020-03-18 | Discharge: 2020-03-18 | Disposition: A | Discharge status: Home / Self Care | Payer: Medicare Other | Source: Ambulatory Visit | Attending: Neurosurgery | Admitting: Neurosurgery

## 2020-03-18 DIAGNOSIS — Z01812 Encounter for preprocedural laboratory examination: Secondary | ICD-10-CM | POA: Insufficient documentation

## 2020-03-18 DIAGNOSIS — Z20822 Contact with and (suspected) exposure to covid-19: Secondary | ICD-10-CM | POA: Insufficient documentation

## 2020-03-18 HISTORY — DX: Other specified postprocedural states: Z98.890

## 2020-03-18 HISTORY — DX: Nausea with vomiting, unspecified: R11.2

## 2020-03-18 LAB — CBC
HCT: 50 % — ABNORMAL HIGH (ref 36.0–46.0)
Hemoglobin: 16 g/dL — ABNORMAL HIGH (ref 12.0–15.0)
MCH: 30.5 pg (ref 26.0–34.0)
MCHC: 32 g/dL (ref 30.0–36.0)
MCV: 95.4 fL (ref 80.0–100.0)
Platelets: 270 10*3/uL (ref 150–400)
RBC: 5.24 MIL/uL — ABNORMAL HIGH (ref 3.87–5.11)
RDW: 13 % (ref 11.5–15.5)
WBC: 10.1 10*3/uL (ref 4.0–10.5)
nRBC: 0 % (ref 0.0–0.2)

## 2020-03-18 LAB — BASIC METABOLIC PANEL
Anion gap: 13 (ref 5–15)
BUN: 15 mg/dL (ref 8–23)
CO2: 21 mmol/L — ABNORMAL LOW (ref 22–32)
Calcium: 9.2 mg/dL (ref 8.9–10.3)
Chloride: 106 mmol/L (ref 98–111)
Creatinine, Ser: 0.88 mg/dL (ref 0.44–1.00)
GFR, Estimated: >60 mL/min (ref >60)
Glucose, Bld: 111 mg/dL — ABNORMAL HIGH (ref 70–99)
Potassium: 3.5 mmol/L (ref 3.5–5.1)
Sodium: 140 mmol/L (ref 135–145)

## 2020-03-18 LAB — SURGICAL PCR SCREEN
MRSA, PCR: NEGATIVE
Staphylococcus aureus: NEGATIVE

## 2020-03-18 LAB — SARS CORONAVIRUS 2 (TAT 6-24 HRS): SARS Coronavirus 2: NEGATIVE

## 2020-03-18 NOTE — Pre-Procedure Instructions (Signed)
Kerrilyn H Deroo  03/18/2020      Your procedure is scheduled on Thursday, December 2nd.  Report to [facility], Main Entrance or Entrance "A" at 1100 A.M.                Your surgery or procedure is scheduled to begin at 1:00 PM   Call this number if you have problems the morning of surgery: [phone]  This is the number for the Pre- Surgical Desk.                For any other questions, please call [phone], Monday - Friday 8 AM - 4 PM.   Remember:  Do not eat or drink after midnight.  Take these medicines the morning of surgery with A SIP OF WATER : amLODipine (NORVASC) busPIRone (BUSPAR) fenofibrate      gabapentin (NEURONTIN)     levothyroxine (SYNTHROID)     metoprolol tartrate (LOPRESSOR)     rosuvastatin (CRESTOR)  Take if needed: LORazepam (ATIVAN) nitroGLYCERIN (NITROSTAT)    Follow Dr. Arnoldo Morale instructions regarding Aspirin.   As of today, STOP taking Aspirin, Aspirin Products (Goody Powder, Excedrin Migraine), Ibuprofen (Advil), Naproxen (Aleve), Vitamins and Herbal Products (ie Fish Oil).    Special instructions:  DO NOT Smoke within 24 hours prior to surgery  [place]- Preparing For Surgery  Before surgery, you can play an important role. Because skin is not sterile, your skin needs to be as free of germs as possible. You can reduce the number of germs on your skin by washing with CHG (chlorahexidine gluconate) Soap before surgery.  CHG is an antiseptic cleaner which kills germs and bonds with the skin to continue killing germs even after washing.    Oral Hygiene is also important to reduce your risk of infection.  Remember - BRUSH YOUR TEETH THE MORNING OF SURGERY WITH YOUR REGULAR TOOTHPASTE  Please do not use if you have an allergy to CHG or antibacterial soaps. If your skin becomes reddened/irritated stop using the CHG.  Do not shave (including legs and underarms) for at least 48 hours prior to first CHG shower. It is OK to shave your  face.  Please follow these instructions carefully.   1. Shower the NIGHT BEFORE SURGERY and the MORNING OF SURGERY with CHG.   2. If you chose to wash your hair, wash your hair first as usual with your normal shampoo.  3. After you shampoo, wash your face and private area with the soap you use at home, then rinse your hair and body thoroughly to remove the shampoo and soap.  4. Use CHG as you would any other liquid soap. You can apply CHG directly to the skin and wash gently with a scrungie or a clean washcloth.   5. Apply the CHG Soap to your body ONLY FROM THE NECK DOWN.  Do not use on open wounds or open sores. Avoid contact with your eyes, ears, mouth and genitals (private parts).   6. Wash thoroughly, paying special attention to the area where your surgery will be performed.  7. Thoroughly rinse your body with warm water from the neck down.  8. DO NOT shower/wash with your normal soap after using and rinsing off the CHG Soap.  9. Pat yourself dry with a CLEAN TOWEL.  10. Wear CLEAN PAJAMAS to bed the night before surgery, wear comfortable clothes the morning of surgery  11. Place CLEAN SHEETS on your bed the night of your  first shower and DO NOT SLEEP WITH PETS.  Day of Surgery: Shower as instructed above. Do not apply any deodorants/lotions, powders or colognes.  Please wear clean clothes to the hospital/surgery center.   Remember to brush your teeth WITH YOUR REGULAR TOOTHPASTE.  Do not wear jewelry, make-up or nail polish.  Do not shave 48 hours prior to surgery.  Men may shave face and neck.  Do not bring valuables to the hospital.  [facility] is not responsible for any belongings or valuables.  Contacts, dentures or bridgework may not be worn into surgery.  Leave your suitcase in the car.  After surgery it may be brought to your room.  For patients admitted to the hospital, discharge time will be determined by your treatment team.  Patients discharged the day of  surgery will not be allowed to drive home.   Please read over the fact sheets that you were given.       +

## 2020-03-18 NOTE — Progress Notes (Signed)
PCP - Dr. Lajuana Ripple Cardiologist - Dr. Percival Spanish  EKG - 03/21/19 Stress Test - 03/15/17 ECHO - 02/25/17 Cardiac Cath - 2012  Sleep Study - denies  Aspirin Instructions: Patient instructed to hold all Aspirin, NSAID's, herbal medications, fish oil and vitamins 7 days prior to surgery.    COVID TEST- 03/18/20   Anesthesia review: cardiac history  Patient denies shortness of breath, fever, cough and chest pain at PAT appointment   All instructions explained to the patient, with a verbal understanding of the material. Patient agrees to go over the instructions while at home for a better understanding. Patient also instructed to self quarantine after being tested for COVID-19. The opportunity to ask questions was provided.

## 2020-03-19 NOTE — Anesthesia Preprocedure Evaluation (Addendum)
Anesthesia Evaluation  Patient identified by MRN, date of birth, ID band  Reviewed: Allergy & Precautions, NPO status , Patient's Chart, lab work & pertinent test results  History of Anesthesia Complications (+) PONV  Airway Mallampati: II  TM Distance: >3 FB     Dental   Pulmonary Current Smoker,    breath sounds clear to auscultation       Cardiovascular hypertension, + CAD and + Past MI  + dysrhythmias  Rhythm:Regular Rate:Normal     Neuro/Psych    GI/Hepatic negative GI ROS, Neg liver ROS,   Endo/Other  negative endocrine ROS  Renal/GU negative Renal ROS     Musculoskeletal   Abdominal   Peds  Hematology   Anesthesia Other Findings   Reproductive/Obstetrics                            Anesthesia Physical Anesthesia Plan  ASA: III  Anesthesia Plan: General   Post-op Pain Management:    Induction: Intravenous  PONV Risk Score and Plan: 3 and Ondansetron, Dexamethasone and Midazolam  Airway Management Planned: Oral ETT  Additional Equipment:   Intra-op Plan:   Post-operative Plan:   Informed Consent: I have reviewed the patients History and Physical, chart, labs and discussed the procedure including the risks, benefits and alternatives for the proposed anesthesia with the patient or authorized representative who has indicated his/her understanding and acceptance.     Interpreter used for [employer] Discussed with: CRNA and Anesthesiologist  Anesthesia Plan Comments: (PAT note written 03/19/2020 by Myra Gianotti, PA-C. )       Anesthesia Quick Evaluation

## 2020-03-19 NOTE — Progress Notes (Signed)
Anesthesia Chart Review:  Case: [id number] Date/Time: 03/20/20 1249   Procedure: MICRODISCECTOMY, LEFT L4-L5 (Left ) - 3C   Anesthesia type: General   Pre-op diagnosis: LUMBAR DISC HERNIATION WITH RADICULOPATHY   Location: [address] OR ROOM 21 / [facility]   Surgeons: Newman Pies, MD      DISCUSSION: Patient is a 74 year old female scheduled for the above procedure.  History includes smoking, post-operative N/V, CAD (s/p late presentation inferior MI, s/p stenting of occluded RCA 03/28/00), HTN, HLD, thyroid cancer (s/p thyroidectomy 02/2003; post-surgical hypothyroidism), IBS, back surgery (right L5-S1 diskectomy 06/30/00), uterovaginal prolapse (s/p [place]).   Last cardiology visit 03/21/19 with Dr. Percival Spanish. She was doing well at that time, able to walk behind a mover and rake leaves in the fall without chest pain and without any new SOB. Known chronic RBBB. No further cardiac testing recommended at that time with one year follow-up planned. Non-ischemic stress test with normal EF in 02/2017. Next visit is scheduled for 04/23/20. She denied chest pain and SOB at PAT RN visit.   [place] COVID-19 vaccine 05/27/19.  03/18/2020 presurgical COVID-19 test negative. Anesthesia team to evaluate on the day of surgery.    VS: BP (!) 172/92   Pulse 66   Temp 36.8 C (Oral)   Resp 18   Ht 5\' 5"  (1.651 m)   Wt 71.5 kg   SpO2 95%   BMI 26.23 kg/m    PROVIDERS: [name], DO is PCP Minus Breeding, MD is cardiologist Judieth Keens, MD is endocrinologist ([place]) Urologist is with [facility]. Last visit 11/01/19 with Arnetha Courser, NP.   LABS: Labs reviewed: Acceptable for surgery. (all labs ordered are listed, but only abnormal results are displayed)  Labs Reviewed  BASIC METABOLIC PANEL - Abnormal; Notable for the following components:      Result Value   CO2 21 (*)    Glucose, Bld 111 (*)    All other components within normal limits  CBC - Abnormal; Notable for the following  components:   RBC 5.24 (*)    Hemoglobin 16.0 (*)    HCT 50.0 (*)    All other components within normal limits  SURGICAL PCR SCREEN     IMAGES: MRI  L-spine 02/05/20 (report in [facility]): IMPRESSION: - Moderately large left-sided disc protrusion L4-5 with left L4 and L5 nerve root impingement and mild spinal stenosis - Right laminectomy L5-S1. Disc degeneration and diffuse spurring.   EKG: 03/21/19 ([facility]): NSR. RBBB. Possible inferior infarct (age undetermined).    CV: Nuclear stress test 03/15/17:  The left ventricular ejection fraction is normal (55-65%).  Nuclear stress EF: 64%.  No T wave inversion was noted during stress.  There was no ST segment deviation noted during stress.  This is a low risk study. No reversible ischemia. LVEF 64% with normal wall motion. Mild RV uptake noted, suggestive of elevated pulmonary pressure. This is a low risk study.  Echo 02/25/17: Study Conclusions  - Left ventricle: The cavity size was normal. Wall thickness was  increased in a pattern of mild LVH. Systolic function was normal.  The estimated ejection fraction was in the range of 55% to 60%.  Wall motion was normal; there were no regional wall motion  abnormalities. Doppler parameters are consistent with abnormal  left ventricular relaxation (grade 1 diastolic dysfunction). The  E/e&' ratio is between 8-15, suggesting indeterminate LV filling  pressure.  - Mitral valve: Calcified annulus. Mildly thickened leaflets .  There was trivial regurgitation.  -  Left atrium: The atrium was normal in size.  - Inferior vena cava: The vessel was normal in size. The  respirophasic diameter changes were in the normal range (>= 50%),  consistent with normal central venous pressure.  Impressions:  - LVEF 55-60%, mild LVH, normal wall motion, grade 1 DD,  indeterminate LV filling pressure, trivial MR, normal LA size,  normal IVC.    Past Medical History:   Diagnosis Date  . CAD (coronary artery disease)    PCI of an occluded right coronary artery 2001. 70% LAD stenosis, 30% circumflex stenosis.  . Diverticulosis   . Hyperlipidemia   . Hypertension   . IBS (irritable bowel syndrome)   . Leukocytosis, unspecified 07/24/2012  . Myocardial infarction ([place]) 2001  . Need for prophylactic hormone replacement therapy (postmenopausal)   . Osteopenia   . Osteoporosis   . PONV (postoperative nausea and vomiting)   . Thyroid cancer [facility]) 2004   Dr. Clovia Cuff   . Thyroid nodule 2004  . Tobacco abuse     Past Surgical History:  Procedure Laterality Date  . APPENDECTOMY  1955  . BACK SURGERY  06/2000  . CARDIAC CATHETERIZATION  2001  . CHOLECYSTECTOMY  07/2002  . EYE SURGERY Bilateral 2019   cataract  . LIPOMA EXCISION Right 08/2003   r arm   . peserie    . THYROIDECTOMY Bilateral 02/2003    MEDICATIONS: . amLODipine (NORVASC) 5 MG tablet  . aspirin 325 MG tablet  . busPIRone (BUSPAR) 15 MG tablet  . conjugated estrogens (PREMARIN) vaginal cream  . diclofenac Sodium (VOLTAREN) 1 % GEL  . fenofibrate 160 MG tablet  . gabapentin (NEURONTIN) 300 MG capsule  . ibuprofen (ADVIL) 600 MG tablet  . levothyroxine (SYNTHROID) 125 MCG tablet  . LORazepam (ATIVAN) 1 MG tablet  . losartan (COZAAR) 100 MG tablet  . Melatonin 5 MG TABS  . Methylcellulose, Laxative, (CITRUCEL PO)  . metoprolol tartrate (LOPRESSOR) 50 MG tablet  . nitroGLYCERIN (NITROSTAT) 0.4 MG SL tablet  . rosuvastatin (CRESTOR) 40 MG tablet  . Vitamin D, Ergocalciferol, (DRISDOL) 1.25 MG (50000 UNIT) CAPS capsule   No current facility-administered medications for this encounter.  To hold ASA, herbals, NSAIDS for surgery.    Myra Gianotti, PA-C Surgical Short Stay/Anesthesiology [facility] Phone [phone] [facility] Phone [phone] 03/19/2020 9:33 AM

## 2020-03-20 ENCOUNTER — Encounter (HOSPITAL_COMMUNITY): Payer: Self-pay | Admitting: Neurosurgery

## 2020-03-20 ENCOUNTER — Encounter (HOSPITAL_COMMUNITY)
Admission: RE | Disposition: A | Discharge status: Home / Self Care | Payer: Self-pay | Source: Home / Self Care | Attending: Neurosurgery

## 2020-03-20 ENCOUNTER — Ambulatory Visit (HOSPITAL_COMMUNITY): Payer: Medicare Other | Admitting: Vascular Surgery

## 2020-03-20 ENCOUNTER — Ambulatory Visit (HOSPITAL_COMMUNITY)
Admission: RE | Admit: 2020-03-20 | Discharge: 2020-03-21 | Disposition: A | Discharge status: Home / Self Care | Payer: Medicare Other | Attending: Neurosurgery | Admitting: Neurosurgery

## 2020-03-20 ENCOUNTER — Ambulatory Visit (HOSPITAL_COMMUNITY): Payer: Medicare Other | Admitting: Certified Registered"

## 2020-03-20 ENCOUNTER — Other Ambulatory Visit: Payer: Self-pay

## 2020-03-20 ENCOUNTER — Ambulatory Visit (HOSPITAL_COMMUNITY): Payer: Medicare Other

## 2020-03-20 DIAGNOSIS — I10 Essential (primary) hypertension: Secondary | ICD-10-CM | POA: Diagnosis not present

## 2020-03-20 DIAGNOSIS — Z79899 Other long term (current) drug therapy: Secondary | ICD-10-CM | POA: Diagnosis not present

## 2020-03-20 DIAGNOSIS — M5126 Other intervertebral disc displacement, lumbar region: Secondary | ICD-10-CM | POA: Diagnosis present

## 2020-03-20 DIAGNOSIS — Z981 Arthrodesis status: Secondary | ICD-10-CM | POA: Diagnosis not present

## 2020-03-20 DIAGNOSIS — F17210 Nicotine dependence, cigarettes, uncomplicated: Secondary | ICD-10-CM | POA: Diagnosis not present

## 2020-03-20 DIAGNOSIS — M5116 Intervertebral disc disorders with radiculopathy, lumbar region: Secondary | ICD-10-CM | POA: Insufficient documentation

## 2020-03-20 DIAGNOSIS — Z888 Allergy status to other drugs, medicaments and biological substances status: Secondary | ICD-10-CM | POA: Insufficient documentation

## 2020-03-20 DIAGNOSIS — Z7982 Long term (current) use of aspirin: Secondary | ICD-10-CM | POA: Diagnosis not present

## 2020-03-20 DIAGNOSIS — Z91040 Latex allergy status: Secondary | ICD-10-CM | POA: Diagnosis not present

## 2020-03-20 DIAGNOSIS — I2510 Atherosclerotic heart disease of native coronary artery without angina pectoris: Secondary | ICD-10-CM | POA: Diagnosis not present

## 2020-03-20 DIAGNOSIS — Z91018 Allergy to other foods: Secondary | ICD-10-CM | POA: Insufficient documentation

## 2020-03-20 DIAGNOSIS — Z419 Encounter for procedure for purposes other than remedying health state, unspecified: Secondary | ICD-10-CM

## 2020-03-20 DIAGNOSIS — M47816 Spondylosis without myelopathy or radiculopathy, lumbar region: Secondary | ICD-10-CM | POA: Diagnosis not present

## 2020-03-20 DIAGNOSIS — E785 Hyperlipidemia, unspecified: Secondary | ICD-10-CM | POA: Diagnosis not present

## 2020-03-20 DIAGNOSIS — Z9049 Acquired absence of other specified parts of digestive tract: Secondary | ICD-10-CM | POA: Diagnosis not present

## 2020-03-20 HISTORY — PX: LUMBAR LAMINECTOMY/DECOMPRESSION MICRODISCECTOMY: SHX5026

## 2020-03-20 SURGERY — LUMBAR LAMINECTOMY/DECOMPRESSION MICRODISCECTOMY 1 LEVEL
Anesthesia: General | Site: Spine Lumbar | Laterality: Left

## 2020-03-20 MED ORDER — HYDROMORPHONE HCL 1 MG/ML IJ SOLN
INTRAMUSCULAR | Status: AC
  Filled 2020-03-20: qty 1

## 2020-03-20 MED ORDER — CYCLOBENZAPRINE HCL 10 MG PO TABS
ORAL_TABLET | ORAL | Status: AC
  Filled 2020-03-20: qty 1

## 2020-03-20 MED ORDER — CHLORHEXIDINE GLUCONATE CLOTH 2 % EX PADS: 6.0000 | MEDICATED_PAD | Freq: Once | CUTANEOUS | Status: DC

## 2020-03-20 MED ORDER — THROMBIN 5000 UNITS EX SOLR
CUTANEOUS | Status: DC | PRN
  Administered 2020-03-20 (×2): 5000 [IU] via TOPICAL

## 2020-03-20 MED ORDER — MIDAZOLAM HCL 5 MG/5ML IJ SOLN
INTRAMUSCULAR | Status: DC | PRN
  Administered 2020-03-20: 2 mg via INTRAVENOUS

## 2020-03-20 MED ORDER — FENOFIBRATE 160 MG PO TABS
160.0000 mg | ORAL_TABLET | Freq: Every day | ORAL | Status: DC
  Administered 2020-03-20: 160 mg via ORAL
  Filled 2020-03-20: qty 1

## 2020-03-20 MED ORDER — BUSPIRONE HCL 15 MG PO TABS
30.0000 mg | ORAL_TABLET | Freq: Two times a day (BID) | ORAL | Status: DC
  Administered 2020-03-20: 30 mg via ORAL
  Filled 2020-03-20 (×4): qty 2

## 2020-03-20 MED ORDER — OXYCODONE HCL 5 MG PO TABS
10.0000 mg | ORAL_TABLET | ORAL | Status: DC | PRN
  Administered 2020-03-21: 10 mg via ORAL
  Filled 2020-03-20 (×2): qty 2

## 2020-03-20 MED ORDER — PHENOL 1.4 % MT LIQD: 1.0000 | OROMUCOSAL | Status: DC | PRN

## 2020-03-20 MED ORDER — EPHEDRINE SULFATE-NACL 50-0.9 MG/10ML-% IV SOSY
PREFILLED_SYRINGE | INTRAVENOUS | Status: DC | PRN
  Administered 2020-03-20 (×3): 10 mg via INTRAVENOUS

## 2020-03-20 MED ORDER — CHLORHEXIDINE GLUCONATE 0.12 % MT SOLN
15.0000 mL | Freq: Once | OROMUCOSAL | Status: AC
  Administered 2020-03-20: 15 mL via OROMUCOSAL
  Filled 2020-03-20: qty 15

## 2020-03-20 MED ORDER — MIDAZOLAM HCL 2 MG/2ML IJ SOLN
INTRAMUSCULAR | Status: AC
  Filled 2020-03-20: qty 2

## 2020-03-20 MED ORDER — ORAL CARE MOUTH RINSE: 15.0000 mL | Freq: Once | OROMUCOSAL | Status: AC

## 2020-03-20 MED ORDER — BUPIVACAINE-EPINEPHRINE 0.5% -1:200000 IJ SOLN
INTRAMUSCULAR | Status: AC
  Filled 2020-03-20: qty 1

## 2020-03-20 MED ORDER — SODIUM CHLORIDE (PF) 0.9 % IJ SOLN
INTRAMUSCULAR | Status: AC
  Filled 2020-03-20: qty 10

## 2020-03-20 MED ORDER — SODIUM CHLORIDE 0.9% FLUSH: 3.0000 mL | INTRAVENOUS | Status: DC | PRN

## 2020-03-20 MED ORDER — DEXAMETHASONE SODIUM PHOSPHATE 10 MG/ML IJ SOLN
INTRAMUSCULAR | Status: AC
  Filled 2020-03-20: qty 1

## 2020-03-20 MED ORDER — PROPOFOL 10 MG/ML IV BOLUS
INTRAVENOUS | Status: AC
  Filled 2020-03-20: qty 20

## 2020-03-20 MED ORDER — ACETAMINOPHEN 650 MG RE SUPP: 650.0000 mg | RECTAL | Status: DC | PRN

## 2020-03-20 MED ORDER — CYCLOBENZAPRINE HCL 10 MG PO TABS
10.0000 mg | ORAL_TABLET | Freq: Three times a day (TID) | ORAL | Status: DC | PRN
  Administered 2020-03-20: 10 mg via ORAL

## 2020-03-20 MED ORDER — LOSARTAN POTASSIUM 50 MG PO TABS
100.0000 mg | ORAL_TABLET | Freq: Every day | ORAL | Status: DC
  Administered 2020-03-20: 100 mg via ORAL
  Filled 2020-03-20: qty 2

## 2020-03-20 MED ORDER — SUGAMMADEX SODIUM 200 MG/2ML IV SOLN
INTRAVENOUS | Status: DC | PRN
  Administered 2020-03-20: 200 mg via INTRAVENOUS

## 2020-03-20 MED ORDER — SUFENTANIL CITRATE 50 MCG/ML IV SOLN
INTRAVENOUS | Status: DC | PRN
  Administered 2020-03-20: 20 ug via INTRAVENOUS

## 2020-03-20 MED ORDER — AMLODIPINE BESYLATE 5 MG PO TABS: 5.0000 mg | ORAL_TABLET | Freq: Every day | ORAL | Status: DC

## 2020-03-20 MED ORDER — ONDANSETRON HCL 4 MG/2ML IJ SOLN
INTRAMUSCULAR | Status: DC | PRN
  Administered 2020-03-20: 4 mg via INTRAVENOUS

## 2020-03-20 MED ORDER — ONDANSETRON HCL 4 MG PO TABS: 4.0000 mg | ORAL_TABLET | Freq: Four times a day (QID) | ORAL | Status: DC | PRN

## 2020-03-20 MED ORDER — MENTHOL 3 MG MT LOZG: 1.0000 | LOZENGE | OROMUCOSAL | Status: DC | PRN

## 2020-03-20 MED ORDER — DOCUSATE SODIUM 100 MG PO CAPS
100.0000 mg | ORAL_CAPSULE | Freq: Two times a day (BID) | ORAL | Status: DC
  Administered 2020-03-20: 100 mg via ORAL
  Filled 2020-03-20: qty 1

## 2020-03-20 MED ORDER — SODIUM CHLORIDE 0.9 % IV SOLN: 250.0000 mL | INTRAVENOUS | Status: DC

## 2020-03-20 MED ORDER — ZOLPIDEM TARTRATE 5 MG PO TABS: 5.0000 mg | ORAL_TABLET | Freq: Every evening | ORAL | Status: DC | PRN

## 2020-03-20 MED ORDER — ONDANSETRON HCL 4 MG/2ML IJ SOLN
INTRAMUSCULAR | Status: AC
  Filled 2020-03-20: qty 2

## 2020-03-20 MED ORDER — ACETAMINOPHEN 325 MG PO TABS: 650.0000 mg | ORAL_TABLET | ORAL | Status: DC | PRN

## 2020-03-20 MED ORDER — SUFENTANIL CITRATE 50 MCG/ML IV SOLN
INTRAVENOUS | Status: AC
  Filled 2020-03-20: qty 1

## 2020-03-20 MED ORDER — BISACODYL 10 MG RE SUPP: 10.0000 mg | Freq: Every day | RECTAL | Status: DC | PRN

## 2020-03-20 MED ORDER — BUPIVACAINE-EPINEPHRINE (PF) 0.5% -1:200000 IJ SOLN
INTRAMUSCULAR | Status: DC | PRN
  Administered 2020-03-20 (×2): 10 mL

## 2020-03-20 MED ORDER — PROPOFOL 10 MG/ML IV BOLUS
INTRAVENOUS | Status: DC | PRN
  Administered 2020-03-20: 140 mg via INTRAVENOUS

## 2020-03-20 MED ORDER — PHENYLEPHRINE 40 MCG/ML (10ML) SYRINGE FOR IV PUSH (FOR BLOOD PRESSURE SUPPORT)
PREFILLED_SYRINGE | INTRAVENOUS | Status: DC | PRN
  Administered 2020-03-20: 80 ug via INTRAVENOUS
  Administered 2020-03-20: 120 ug via INTRAVENOUS
  Administered 2020-03-20: 80 ug via INTRAVENOUS

## 2020-03-20 MED ORDER — DEXAMETHASONE SODIUM PHOSPHATE 10 MG/ML IJ SOLN
INTRAMUSCULAR | Status: DC | PRN
  Administered 2020-03-20: 5 mg via INTRAVENOUS

## 2020-03-20 MED ORDER — HYDROMORPHONE HCL 1 MG/ML IJ SOLN
0.2500 mg | INTRAMUSCULAR | Status: DC | PRN
  Administered 2020-03-20: 0.25 mg via INTRAVENOUS

## 2020-03-20 MED ORDER — LIDOCAINE 2% (20 MG/ML) 5 ML SYRINGE
INTRAMUSCULAR | Status: DC | PRN
  Administered 2020-03-20: 100 mg via INTRAVENOUS

## 2020-03-20 MED ORDER — ROCURONIUM BROMIDE 10 MG/ML (PF) SYRINGE
PREFILLED_SYRINGE | INTRAVENOUS | Status: DC | PRN
  Administered 2020-03-20: 50 mg via INTRAVENOUS

## 2020-03-20 MED ORDER — MORPHINE SULFATE (PF) 4 MG/ML IV SOLN: 4.0000 mg | INTRAVENOUS | Status: DC | PRN

## 2020-03-20 MED ORDER — LEVOTHYROXINE SODIUM 25 MCG PO TABS
125.0000 ug | ORAL_TABLET | Freq: Every day | ORAL | Status: DC
  Administered 2020-03-21: 125 ug via ORAL
  Filled 2020-03-20: qty 1

## 2020-03-20 MED ORDER — ROSUVASTATIN CALCIUM 20 MG PO TABS
40.0000 mg | ORAL_TABLET | Freq: Every day | ORAL | Status: DC
  Administered 2020-03-20: 40 mg via ORAL
  Filled 2020-03-20: qty 2

## 2020-03-20 MED ORDER — LORAZEPAM 0.5 MG PO TABS: 0.5000 mg | ORAL_TABLET | Freq: Every day | ORAL | Status: DC | PRN

## 2020-03-20 MED ORDER — ONDANSETRON HCL 4 MG/2ML IJ SOLN: 4.0000 mg | Freq: Four times a day (QID) | INTRAMUSCULAR | Status: DC | PRN

## 2020-03-20 MED ORDER — 0.9 % SODIUM CHLORIDE (POUR BTL) OPTIME
TOPICAL | Status: DC | PRN
  Administered 2020-03-20: 1000 mL

## 2020-03-20 MED ORDER — OXYCODONE HCL 5 MG PO TABS
5.0000 mg | ORAL_TABLET | ORAL | Status: DC | PRN
  Administered 2020-03-20: 5 mg via ORAL
  Filled 2020-03-20: qty 1

## 2020-03-20 MED ORDER — LIDOCAINE HCL (PF) 2 % IJ SOLN
INTRAMUSCULAR | Status: AC
  Filled 2020-03-20: qty 5

## 2020-03-20 MED ORDER — THROMBIN 5000 UNITS EX SOLR
CUTANEOUS | Status: AC
  Filled 2020-03-20: qty 10000

## 2020-03-20 MED ORDER — SODIUM CHLORIDE 0.9% FLUSH: 3.0000 mL | Freq: Two times a day (BID) | INTRAVENOUS | Status: DC

## 2020-03-20 MED ORDER — THROMBIN 5000 UNITS EX SOLR
CUTANEOUS | Status: AC
  Filled 2020-03-20: qty 5000

## 2020-03-20 MED ORDER — CEFAZOLIN SODIUM-DEXTROSE 2-4 GM/100ML-% IV SOLN
2.0000 g | INTRAVENOUS | Status: AC
  Administered 2020-03-20: 2 g via INTRAVENOUS
  Filled 2020-03-20: qty 100

## 2020-03-20 MED ORDER — METOPROLOL TARTRATE 25 MG PO TABS: 50.0000 mg | ORAL_TABLET | Freq: Every day | ORAL | Status: DC

## 2020-03-20 MED ORDER — ROCURONIUM BROMIDE 10 MG/ML (PF) SYRINGE
PREFILLED_SYRINGE | INTRAVENOUS | Status: AC
  Filled 2020-03-20: qty 10

## 2020-03-20 MED ORDER — GABAPENTIN 300 MG PO CAPS
300.0000 mg | ORAL_CAPSULE | Freq: Three times a day (TID) | ORAL | Status: DC
  Administered 2020-03-20: 300 mg via ORAL
  Filled 2020-03-20: qty 1

## 2020-03-20 MED ORDER — LACTATED RINGERS IV SOLN: INTRAVENOUS | Status: DC

## 2020-03-20 MED ORDER — CEFAZOLIN SODIUM-DEXTROSE 2-4 GM/100ML-% IV SOLN
2.0000 g | Freq: Three times a day (TID) | INTRAVENOUS | Status: AC
  Administered 2020-03-20 – 2020-03-21 (×2): 2 g via INTRAVENOUS
  Filled 2020-03-20 (×2): qty 100

## 2020-03-20 MED ORDER — NITROGLYCERIN 0.4 MG SL SUBL: 0.4000 mg | SUBLINGUAL_TABLET | SUBLINGUAL | Status: DC | PRN

## 2020-03-20 MED ORDER — ACETAMINOPHEN 500 MG PO TABS
1000.0000 mg | ORAL_TABLET | Freq: Four times a day (QID) | ORAL | Status: DC
  Administered 2020-03-20 – 2020-03-21 (×3): 1000 mg via ORAL
  Filled 2020-03-20 (×3): qty 2

## 2020-03-20 SURGICAL SUPPLY — 48 items
APL SKNCLS STERI-STRIP NONHPOA (GAUZE/BANDAGES/DRESSINGS) ×1
BAND INSRT 18 STRL LF DISP RB (MISCELLANEOUS) ×2
BAND RUBBER #18 3X1/16 STRL (MISCELLANEOUS) ×6 IMPLANT
BENZOIN TINCTURE PRP APPL 2/3 (GAUZE/BANDAGES/DRESSINGS) ×3 IMPLANT
BLADE CLIPPER SURG (BLADE) IMPLANT
BUR MATCHSTICK NEURO 3.0 LAGG (BURR) ×3 IMPLANT
BUR PRECISION FLUTE 6.0 (BURR) ×3 IMPLANT
CANISTER SUCT 3000ML PPV (MISCELLANEOUS) ×3 IMPLANT
CARTRIDGE OIL MAESTRO DRILL (MISCELLANEOUS) ×1 IMPLANT
CLOSURE WOUND 1/2 X4 (GAUZE/BANDAGES/DRESSINGS) ×1
COVER WAND RF STERILE (DRAPES) ×3 IMPLANT
DIFFUSER DRILL AIR PNEUMATIC (MISCELLANEOUS) ×3 IMPLANT
DRAPE LAPAROTOMY 100X72X124 (DRAPES) ×3 IMPLANT
DRAPE MICROSCOPE LEICA (MISCELLANEOUS) ×3 IMPLANT
DRAPE SURG 17X23 STRL (DRAPES) ×12 IMPLANT
DRSG OPSITE POSTOP 4X6 (GAUZE/BANDAGES/DRESSINGS) ×2 IMPLANT
ELECT BLADE 4.0 EZ CLEAN MEGAD (MISCELLANEOUS) ×3
ELECT REM PT RETURN 9FT ADLT (ELECTROSURGICAL) ×3
ELECTRODE BLDE 4.0 EZ CLN MEGD (MISCELLANEOUS) ×1 IMPLANT
ELECTRODE REM PT RTRN 9FT ADLT (ELECTROSURGICAL) ×1 IMPLANT
GAUZE 4X4 16PLY RFD (DISPOSABLE) IMPLANT
GAUZE SPONGE 4X4 12PLY STRL (GAUZE/BANDAGES/DRESSINGS) ×3 IMPLANT
GLOVE BIO SURGEON STRL SZ8 (GLOVE) ×3 IMPLANT
GLOVE BIO SURGEON STRL SZ8.5 (GLOVE) ×3 IMPLANT
GLOVE EXAM NITRILE XL STR (GLOVE) IMPLANT
GOWN STRL REUS W/ TWL LRG LVL3 (GOWN DISPOSABLE) IMPLANT
GOWN STRL REUS W/ TWL XL LVL3 (GOWN DISPOSABLE) ×1 IMPLANT
GOWN STRL REUS W/TWL 2XL LVL3 (GOWN DISPOSABLE) IMPLANT
GOWN STRL REUS W/TWL LRG LVL3 (GOWN DISPOSABLE)
GOWN STRL REUS W/TWL XL LVL3 (GOWN DISPOSABLE) ×3
KIT BASIN OR (CUSTOM PROCEDURE TRAY) ×3 IMPLANT
KIT TURNOVER KIT B (KITS) ×3 IMPLANT
NDL HYPO 21X1.5 SAFETY (NEEDLE) IMPLANT
NEEDLE HYPO 21X1.5 SAFETY (NEEDLE) IMPLANT
NEEDLE HYPO 22GX1.5 SAFETY (NEEDLE) ×3 IMPLANT
NS IRRIG 1000ML POUR BTL (IV SOLUTION) ×3 IMPLANT
OIL CARTRIDGE MAESTRO DRILL (MISCELLANEOUS) ×3
PACK LAMINECTOMY NEURO (CUSTOM PROCEDURE TRAY) ×3 IMPLANT
PAD ARMBOARD 7.5X6 YLW CONV (MISCELLANEOUS) ×9 IMPLANT
PATTIES SURGICAL .5 X1 (DISPOSABLE) IMPLANT
SPONGE SURGIFOAM ABS GEL SZ50 (HEMOSTASIS) ×3 IMPLANT
STRIP CLOSURE SKIN 1/2X4 (GAUZE/BANDAGES/DRESSINGS) ×2 IMPLANT
SUT VIC AB 1 CT1 18XBRD ANBCTR (SUTURE) ×1 IMPLANT
SUT VIC AB 1 CT1 8-18 (SUTURE) ×3
SUT VIC AB 2-0 CP2 18 (SUTURE) ×3 IMPLANT
TOWEL GREEN STERILE (TOWEL DISPOSABLE) ×3 IMPLANT
TOWEL GREEN STERILE FF (TOWEL DISPOSABLE) ×3 IMPLANT
WATER STERILE IRR 1000ML POUR (IV SOLUTION) ×3 IMPLANT

## 2020-03-20 NOTE — Op Note (Signed)
Brief history: The patient is a 74 year old white female who has complained of back and left leg pain consistent with a lumbar radiculopathy.  She has failed medical management and was worked up with a lumbar MRI.  This demonstrated herniated disks at L4-5 on the left.  I discussed the various treatment options.  She has decided proceed with surgery.  Preoperative diagnosis: Left L4-5 far lateral hernia disc, lumbago, lumbar radiculopathy  Postoperative diagnosis: The same  Procedure: Left L4-5 far lateral intervertebral discectomy using micro-dissection  Surgeon: Dr. Earle Gell  Asst.: Dr. Ashok Pall  Anesthesia: Gen. endotracheal  Estimated blood loss: Minimal  Drains: None  Complications: None  Description of procedure: The patient was brought to the operating room by the anesthesia team. General endotracheal anesthesia was induced. The patient was turned to the prone position on the Wilson frame. The patient's lumbosacral region was then prepared with Betadine scrub and Betadine solution. Sterile drapes were applied.  I then injected the area to be incised with Marcaine with epinephrine solution. I then used a scalpel to make a linear midline incision over the L4-5 intervertebral disc space. I then used electrocautery to perform a left sided subperiosteal dissection exposing the spinous process and lamina of L4-5. We obtained intraoperative radiograph to confirm our location. I then inserted the [facility] retractor for exposure.  We then brought the operative microscope into the field. Under its magnification and illumination we completed the microdissection. I used a high-speed drill to drill off the lateral aspect of the left L4 pars. I then used a Kerrison punches to remove the interspinous ligament at L4-5 on the left. We then used microdissection to free up the thecal sac and the exiting left L4 nerve root from the epidural tissue. I then used a Kerrison punch to perform a  foraminotomy at about the L4 nerve root.  We carefully dissected in the axilla of the left L4 nerve root and encountered a far lateral herniated disc as expected.  We removed it in multiple fragments using the pituitary forceps.I then palpated along the ventral surface of the left L4 nerve root and noted that the neural structures were well decompressed. This completed the decompression.  We then obtained hemostasis using bipolar electrocautery. We irrigated the wound out with bacitracin solution. We then removed the retractor. We then reapproximated the patient's thoracolumbar fascia with interrupted #1 Vicryl suture. We then reapproximated the patient's subcutaneous tissue with interrupted 2-0 Vicryl suture. We then reapproximated patient's skin with Steri-Strips and benzoin. The was then coated with bacitracin ointment. The drapes were removed. The patient was subsequently returned to the supine position where they were extubated by the anesthesia team. The patient was then transported to the postanesthesia care unit in stable condition. All sponge instrument and needle counts were reportedly correct at the end of this case.

## 2020-03-20 NOTE — Anesthesia Postprocedure Evaluation (Signed)
Anesthesia Post Note  Patient: Madison [name]  Procedure(s) Performed: LEFT LUMBAR FOUR-FIVE MICRODISCECTOMY (Left Spine Lumbar)     Patient location during evaluation: PACU Anesthesia Type: General Level of consciousness: awake Pain management: pain level controlled Respiratory status: spontaneous breathing Cardiovascular status: stable Postop Assessment: no apparent nausea or vomiting Anesthetic complications: no   No complications documented.  Last Vitals:  Vitals:   03/20/20 1725 03/20/20 1740  BP: (!) 145/65 (!) 150/72  Pulse: 80 77  Resp: 16 19  Temp: 36.4 C   SpO2: 91% 94%    Last Pain:  Vitals:   03/20/20 1752  TempSrc:   PainSc: 6                  Orlie Cundari

## 2020-03-20 NOTE — Transfer of Care (Signed)
Immediate Anesthesia Transfer of Care Note  Patient: Madison [name]  Procedure(s) Performed: LEFT LUMBAR FOUR-FIVE MICRODISCECTOMY (Left Spine Lumbar)  Patient Location: PACU  Anesthesia Type:General  Level of Consciousness: drowsy and patient cooperative  Airway & Oxygen Therapy: Patient Spontanous Breathing and Patient connected to nasal cannula oxygen  Post-op Assessment: Report given to RN, Post -op Vital signs reviewed and stable and Patient moving all extremities  Post vital signs: Reviewed and stable  Last Vitals:  Vitals Value Taken Time  BP    Temp    Pulse    Resp    SpO2      Last Pain:  Vitals:   03/20/20 1122  TempSrc:   PainSc: 0-No pain      Patients Stated Pain Goal: 3 (61/95/09 3267)  Complications: No complications documented.

## 2020-03-20 NOTE — Anesthesia Procedure Notes (Signed)
Procedure Name: Intubation Date/Time: 03/20/2020 3:39 PM Performed by: Moshe Salisbury, CRNA Pre-anesthesia Checklist: Patient identified, Emergency Drugs available, Suction available and Patient being monitored Patient Re-evaluated:Patient Re-evaluated prior to induction Oxygen Delivery Method: Circle System Utilized Preoxygenation: Pre-oxygenation with 100% oxygen Induction Type: IV induction Ventilation: Mask ventilation without difficulty Laryngoscope Size: Mac and 3 Grade View: Grade II Tube type: Oral Tube size: 7.5 mm Number of attempts: 1 Airway Equipment and Method: Stylet Placement Confirmation: ETT inserted through vocal cords under direct vision,  positive ETCO2 and breath sounds checked- equal and bilateral Secured at: 20 cm Tube secured with: Tape Dental Injury: Teeth and Oropharynx as per pre-operative assessment

## 2020-03-20 NOTE — H&P (Signed)
Madison [name] is an 74 y.o. female.   Chief Complaint: Left leg pain HPI: The patient is a 74 year old white female who has complained of back, buttock and left leg pain consistent with a lumbar radiculopathy.  She has failed medical management.  She was worked up with a lumbar MRI which demonstrated a herniated disc at L4-5.  I discussed the various treatment options including surgery.  She has decided proceed with a discectomy after weighing the risks, benefits and alternatives.  Past Medical History:  Diagnosis Date  . CAD (coronary artery disease)    PCI of an occluded right coronary artery 2001. 70% LAD stenosis, 30% circumflex stenosis.  . Diverticulosis   . Hyperlipidemia   . Hypertension   . IBS (irritable bowel syndrome)   . Leukocytosis, unspecified 07/24/2012  . Myocardial infarction ([place]) 2001  . Need for prophylactic hormone replacement therapy (postmenopausal)   . Osteopenia   . Osteoporosis   . PONV (postoperative nausea and vomiting)   . Thyroid cancer [facility]) 2004   Dr. Clovia Cuff   . Thyroid nodule 2004  . Tobacco abuse     Past Surgical History:  Procedure Laterality Date  . APPENDECTOMY  1955  . BACK SURGERY  06/2000  . CARDIAC CATHETERIZATION  2001  . CHOLECYSTECTOMY  07/2002  . EYE SURGERY Bilateral 2019   cataract  . LIPOMA EXCISION Right 08/2003   r arm   . peserie    . THYROIDECTOMY Bilateral 02/2003    Family History  Problem Relation Age of Onset  . Coronary artery disease Father   . Heart disease Father   . Cirrhosis Mother   . Alcohol abuse Mother   . Cancer Other        family history   . Hypertension Maternal Grandmother    Social History:  reports that she has been smoking cigarettes. She has a 40.00 pack-year smoking history. She has never used smokeless tobacco. She reports that she does not drink alcohol and does not use drugs.  Allergies:  Allergies  Allergen Reactions  . Banana Itching and Swelling    Throat and ears itch,ears feel  like they swell  . Bupropion Hcl     REACTION: paranoia  . Chantix [Varenicline]     paranoia  . Latex Rash  . Zetia [Ezetimibe]     Myalgia    Medications Prior to Admission  Medication Sig Dispense Refill  . amLODipine (NORVASC) 5 MG tablet TAKE ONE (1) TABLET EACH DAY (Patient taking differently: Take 5 mg by mouth daily. ) 90 tablet 3  . aspirin 325 MG tablet Take 325 mg by mouth daily.    . busPIRone (BUSPAR) 15 MG tablet Take 2 tablets (30 mg total) by mouth 2 (two) times daily. 360 tablet 3  . conjugated estrogens (PREMARIN) vaginal cream 1 application 3 times weekly (Patient taking differently: Place 1 Applicatorful vaginally 3 (three) times a week. ) 42.5 g 12  . fenofibrate 160 MG tablet 1 tablet daily (Patient taking differently: Take 160 mg by mouth daily. ) 90 tablet 1  . gabapentin (NEURONTIN) 300 MG capsule Take 300 mg by mouth 3 (three) times daily.    Marland Kitchen levothyroxine (SYNTHROID) 125 MCG tablet Take 125 mcg by mouth daily before breakfast.    . LORazepam (ATIVAN) 1 MG tablet Take 0.5-1 tablets (0.5-1 mg total) by mouth daily as needed for anxiety. 30 tablet 1  . losartan (COZAAR) 100 MG tablet Take 1 tablet (100 mg total) by mouth  daily. 90 tablet 3  . Melatonin 5 MG TABS Take 5 mg by mouth at bedtime.     . Methylcellulose, Laxative, (CITRUCEL PO) Take 1 Package by mouth daily.    . metoprolol tartrate (LOPRESSOR) 50 MG tablet TAKE ONE (1) TABLET EACH DAY (Patient taking differently: Take 50 mg by mouth daily. ) 90 tablet 3  . nitroGLYCERIN (NITROSTAT) 0.4 MG SL tablet Place 1 tablet (0.4 mg total) under the tongue every 5 (five) minutes as needed for chest pain. 25 tablet 3  . rosuvastatin (CRESTOR) 40 MG tablet TAKE ONE (1) TABLET EACH DAY (Patient taking differently: Take 40 mg by mouth daily. ) 90 tablet 3  . Vitamin D, Ergocalciferol, (DRISDOL) 1.25 MG (50000 UNIT) CAPS capsule Take 1 capsule (50,000 Units total) by mouth every 7 (seven) days. 12 capsule 3  .  diclofenac Sodium (VOLTAREN) 1 % GEL Apply 2 g topically 4 (four) times daily. (Patient not taking: Reported on 03/11/2020) 2 g 1  . ibuprofen (ADVIL) 600 MG tablet Take 1 tablet (600 mg total) by mouth every 8 (eight) hours as needed. (Patient not taking: Reported on 03/11/2020) 30 tablet 0    No results found for this or any previous visit (from the past 48 hour(s)). No results found.  Review of Systems: As above  Blood pressure (!) 200/86, pulse 74, temperature 98.3 F (36.8 C), temperature source Oral, resp. rate 18, height 5\' 5"  (1.651 m), weight 71.2 kg, SpO2 96 %. Physical Exam  General: An alert pleasant 74 year old white female  HEENT: Unremarkable  Thorax: Symmetric  Abdomen soft  Extremities unremarkable  Neurologic exam: The patient is alert and oriented.  Cranial nerve exam is grossly normal.  Her strength is grossly normal.  Assessment/Plan Left L4-5.  Disc, lumbago, lumbar radiculopathy: I have discussed the situation with the patient.  I reviewed her imaging studies with her and pointed out the abnormalities.  We have discussed the various treatment options including surgery.  I have described the surgical treatment option of an left L4-5 discectomy.  I have shown her surgical models.  I have given her a surgical pamphlet.  We have discussed the risk, benefits, alternatives, expected postoperative course, and likelihood of achieving our goals with surgery.  I have answered all her questions.  She has decided proceed with surgery.  Ophelia Charter, MD 03/20/2020, 2:41 PM

## 2020-03-21 ENCOUNTER — Encounter (HOSPITAL_COMMUNITY): Payer: Self-pay | Admitting: Neurosurgery

## 2020-03-21 DIAGNOSIS — Z91040 Latex allergy status: Secondary | ICD-10-CM | POA: Diagnosis not present

## 2020-03-21 DIAGNOSIS — Z79899 Other long term (current) drug therapy: Secondary | ICD-10-CM | POA: Diagnosis not present

## 2020-03-21 DIAGNOSIS — M5116 Intervertebral disc disorders with radiculopathy, lumbar region: Secondary | ICD-10-CM | POA: Diagnosis not present

## 2020-03-21 DIAGNOSIS — Z91018 Allergy to other foods: Secondary | ICD-10-CM | POA: Diagnosis not present

## 2020-03-21 DIAGNOSIS — Z7982 Long term (current) use of aspirin: Secondary | ICD-10-CM | POA: Diagnosis not present

## 2020-03-21 DIAGNOSIS — Z888 Allergy status to other drugs, medicaments and biological substances status: Secondary | ICD-10-CM | POA: Diagnosis not present

## 2020-03-21 DIAGNOSIS — F17210 Nicotine dependence, cigarettes, uncomplicated: Secondary | ICD-10-CM | POA: Diagnosis not present

## 2020-03-21 MED ORDER — OXYCODONE-ACETAMINOPHEN 5-325 MG PO TABS
1.0000 | ORAL_TABLET | ORAL | 0 refills | Status: DC | PRN
Start: 2020-03-21 — End: 2020-04-23

## 2020-03-21 MED ORDER — OXYCODONE-ACETAMINOPHEN 5-325 MG PO TABS: 1.0000 | ORAL_TABLET | ORAL | Status: DC | PRN

## 2020-03-21 MED ORDER — CYCLOBENZAPRINE HCL 10 MG PO TABS
10.0000 mg | ORAL_TABLET | Freq: Three times a day (TID) | ORAL | 0 refills | Status: DC | PRN
Start: 2020-03-21 — End: 2020-04-23

## 2020-03-21 MED ORDER — DOCUSATE SODIUM 100 MG PO CAPS
100.0000 mg | ORAL_CAPSULE | Freq: Two times a day (BID) | ORAL | 0 refills | Status: DC
Start: 2020-03-21 — End: 2020-04-23

## 2020-03-21 NOTE — Progress Notes (Signed)
Patient is discharged from room 3C03 at this time. Alert and in stable condition. IV site d/c'd and instructions read to patient and spouse with understanding verbalized and all questions answered. Left unit via wheelchair with all belongings at side. ?

## 2020-03-21 NOTE — Discharge Summary (Signed)
Physician Discharge Summary  Patient ID: Madison [name] MRN: [id number] DOB/AGE: [birth year] 73 y.o.  Admit date: 03/20/2020 Discharge date: 03/21/2020  Admission Diagnoses: L4-5 far lateral herniation, lumbago, lumbar radiculopathy  Discharge Diagnoses: The same Active Problems:   Herniated intervertebral disc of lumbar spine   Discharged Condition: good  Hospital Course: I performed a left L4-5 far lateral discectomy on the patient on 03/20/2020.  The surgery went well.  The patient's postoperative course was unremarkable.  On postoperative day #1 she requested discharge to home.  She was given written and oral discharge instructions.  All her questions were answered.  Consults: PT, OT, care management Significant Diagnostic Studies: None Treatments: Left L4-5 far lateral discectomy using microdissection Discharge Exam: Blood pressure 123/63, pulse 70, temperature 97.7 F (36.5 C), temperature source Oral, resp. rate 18, height 5\' 5"  (1.651 m), weight 71.2 kg, SpO2 93 %. The patient is alert and pleasant.  She looks well.  Her strength is normal.  Disposition: Home  Discharge Instructions    Call MD for:  difficulty breathing, headache or visual disturbances   Complete by: As directed    Call MD for:  extreme fatigue   Complete by: As directed    Call MD for:  hives   Complete by: As directed    Call MD for:  persistant dizziness or light-headedness   Complete by: As directed    Call MD for:  persistant nausea and vomiting   Complete by: As directed    Call MD for:  redness, tenderness, or signs of infection (pain, swelling, redness, odor or green/yellow discharge around incision site)   Complete by: As directed    Call MD for:  severe uncontrolled pain   Complete by: As directed    Call MD for:  temperature >100.4   Complete by: As directed    Diet - low sodium heart healthy   Complete by: As directed    Discharge instructions   Complete by: As directed    Call  [phone] for a followup appointment. Take a stool softener while you are using pain medications.   Driving Restrictions   Complete by: As directed    Do not drive for 2 weeks.   Increase activity slowly   Complete by: As directed    Lifting restrictions   Complete by: As directed    Do not lift more than 5 pounds. No excessive bending or twisting.   May shower / Bathe   Complete by: As directed    Remove the dressing for 3 days after surgery.  You may shower, but leave the incision alone.   Remove dressing in 48 hours   Complete by: As directed      Allergies as of 03/21/2020      Reactions   Banana Itching, Swelling   Throat and ears itch,ears feel like they swell   Bupropion Hcl    REACTION: paranoia   Chantix [varenicline]    paranoia   Latex Rash   Zetia [ezetimibe]    Myalgia      Medication List    STOP taking these medications   diclofenac Sodium 1 % Gel Commonly known as: Voltaren   ibuprofen 600 MG tablet Commonly known as: ADVIL     TAKE these medications   amLODipine 5 MG tablet Commonly known as: NORVASC TAKE ONE (1) TABLET EACH DAY What changed:   how much to take  how to take this  when to take this  additional instructions  aspirin 325 MG tablet Take 325 mg by mouth daily.   busPIRone 15 MG tablet Commonly known as: BUSPAR Take 2 tablets (30 mg total) by mouth 2 (two) times daily.   CITRUCEL PO Take 1 Package by mouth daily.   conjugated estrogens vaginal cream Commonly known as: Premarin 1 application 3 times weekly What changed:   how much to take  how to take this  when to take this  additional instructions   cyclobenzaprine 10 MG tablet Commonly known as: FLEXERIL Take 1 tablet (10 mg total) by mouth 3 (three) times daily as needed for muscle spasms.   docusate sodium 100 MG capsule Commonly known as: COLACE Take 1 capsule (100 mg total) by mouth 2 (two) times daily.   fenofibrate 160 MG tablet 1 tablet  daily What changed:   how much to take  how to take this  when to take this  additional instructions   gabapentin 300 MG capsule Commonly known as: NEURONTIN Take 300 mg by mouth 3 (three) times daily.   levothyroxine 125 MCG tablet Commonly known as: SYNTHROID Take 125 mcg by mouth daily before breakfast.   LORazepam 1 MG tablet Commonly known as: ATIVAN Take 0.5-1 tablets (0.5-1 mg total) by mouth daily as needed for anxiety.   losartan 100 MG tablet Commonly known as: COZAAR Take 1 tablet (100 mg total) by mouth daily.   melatonin 5 MG Tabs Take 5 mg by mouth at bedtime.   metoprolol tartrate 50 MG tablet Commonly known as: LOPRESSOR TAKE ONE (1) TABLET EACH DAY What changed:   how much to take  how to take this  when to take this  additional instructions   nitroGLYCERIN 0.4 MG SL tablet Commonly known as: Nitrostat Place 1 tablet (0.4 mg total) under the tongue every 5 (five) minutes as needed for chest pain.   oxyCODONE-acetaminophen 5-325 MG tablet Commonly known as: PERCOCET/ROXICET Take 1-2 tablets by mouth every 4 (four) hours as needed for moderate pain.   rosuvastatin 40 MG tablet Commonly known as: CRESTOR TAKE ONE (1) TABLET EACH DAY What changed:   how much to take  how to take this  when to take this  additional instructions   Vitamin D (Ergocalciferol) 1.25 MG (50000 UNIT) Caps capsule Commonly known as: DRISDOL Take 1 capsule (50,000 Units total) by mouth every 7 (seven) days.        Signed: Ophelia Charter 03/21/2020, 7:51 AM

## 2020-03-21 NOTE — Evaluation (Signed)
Physical Therapy Evaluation Patient Details Name: Madison [name] MRN: [id number] DOB: [birth year] Today's Date: 03/21/2020   History of Present Illness  Patient is a 74 y/o female who presents s/p left L4-5 microdiscectomy 12/2. PMH includes thyroid disease, HTN, HLD, MI, CAD.  Clinical Impression  Patient presents with pain, impaired sensation LLE and post surgical deficits s/p above surgery. Pt reports being independent for ADLs/IADLs and driving PTA. Today, pt tolerated bed mobility, transfers, gait and stair training with Mod I for safety. Education re: back precautions, log roll technique, positioning, walking program etc. Pt has support from spouse at home. All education completed. Pt does not require skilled therapy services. Discharge from therapy.    Follow Up Recommendations No PT follow up    Equipment Recommendations  None recommended by PT    Recommendations for Other Services       Precautions / Restrictions Precautions Precautions: Back Precaution Booklet Issued: Yes (comment) Precaution Comments: Provided handout and reviewed precautions Restrictions Weight Bearing Restrictions: No      Mobility  Bed Mobility Overal bed mobility: Needs Assistance Bed Mobility: Rolling;Sidelying to Sit;Sit to Sidelying Rolling: Modified independent (Device/Increase time) Sidelying to sit: Modified independent (Device/Increase time)     Sit to sidelying: Modified independent (Device/Increase time) General bed mobility comments: HOB flat, no use of rail to simulate home. Cues for log roll technique.    Transfers Overall transfer level: Modified independent Equipment used: None             General transfer comment: Stood from EOB x1, x1 from toilet.  Ambulation/Gait Ambulation/Gait assistance: Modified independent (Device/Increase time) Gait Distance (Feet): 400 Feet Assistive device: None Gait Pattern/deviations: Step-through pattern;Decreased stride length   Gait  velocity interpretation: 1.31 - 2.62 ft/sec, indicative of limited community ambulator General Gait Details: Slow, steady gait with no evidence of imbalance.  Stairs Stairs: Yes   Stair Management: One rail Left Number of Stairs: 3 General stair comments: Cues for technique/safety.  Wheelchair Mobility    Modified Rankin (Stroke Patients Only)       Balance Overall balance assessment: No apparent balance deficits (not formally assessed)                                           Pertinent Vitals/Pain Pain Assessment: 0-10 Pain Score: 4  Pain Location: back Pain Descriptors / Indicators: Sore;Operative site guarding Pain Intervention(s): Monitored during session;Repositioned    Home Living Family/patient expects to be discharged to:: Private residence Living Arrangements: Spouse/significant other Available Help at Discharge: Family;Available 24 hours/day Type of Home: House Home Access: Stairs to enter Entrance Stairs-Rails: Right Entrance Stairs-Number of Steps: 3 Home Layout: One level Home Equipment: None      Prior Function Level of Independence: Independent         Comments: Does own ADLs/IADLs, drives. Loves to garden.     Hand Dominance        Extremity/Trunk Assessment   Upper Extremity Assessment Upper Extremity Assessment: Defer to OT evaluation    Lower Extremity Assessment Lower Extremity Assessment: LLE deficits/detail;Overall WFL for tasks assessed LLE Deficits / Details: Some mild numbness around ankle LLE Sensation: decreased light touch    Cervical / Trunk Assessment Cervical / Trunk Assessment: Other exceptions Cervical / Trunk Exceptions: s/p back surgery  Communication   Communication: No difficulties  Cognition Arousal/Alertness: Awake/alert Behavior During Therapy: WFL for tasks  assessed/performed Overall Cognitive Status: Within Functional Limits for tasks assessed                                         General Comments General comments (skin integrity, edema, etc.): Bandage clean dry and intact.    Exercises     Assessment/Plan    PT Assessment Patent does not need any further PT services  PT Problem List         PT Treatment Interventions      PT Goals (Current goals can be found in the Care Plan section)  Acute Rehab PT Goals Patient Stated Goal: to go home PT Goal Formulation: All assessment and education complete, DC therapy    Frequency     Barriers to discharge        Co-evaluation               AM-PAC PT "6 Clicks" Mobility  Outcome Measure Help needed turning from your back to your side while in a flat bed without using bedrails?: None Help needed moving from lying on your back to sitting on the side of a flat bed without using bedrails?: None Help needed moving to and from a bed to a chair (including a wheelchair)?: None Help needed standing up from a chair using your arms (e.g., wheelchair or bedside chair)?: None Help needed to walk in hospital room?: None Help needed climbing 3-5 steps with a railing? : None 6 Click Score: 24    End of Session   Activity Tolerance: Patient tolerated treatment well Patient left: in bed;with call bell/phone within reach Nurse Communication: Mobility status PT Visit Diagnosis: Pain Pain - part of body:  (back)    Time: 9021-1155 PT Time Calculation (min) (ACUTE ONLY): 19 min   Charges:   PT Evaluation $PT Eval Moderate Complexity: 1 Mod          Marisa Severin, PT, DPT Acute Rehabilitation Services Pager [phone] Office [place] 03/21/2020, 8:47 AM

## 2020-03-21 NOTE — Evaluation (Signed)
Occupational Therapy Evaluation Patient Details Name: Madison [name] MRN: [id number] DOB: [birth year] Today's Date: 03/21/2020    History of Present Illness Patient is a 74 y/o female who presents s/p left L4-5 microdiscectomy 12/2. PMH includes thyroid disease, HTN, HLD, MI, CAD.   Clinical Impression   PTA patient independent and driving. Admitted for above limited by problem list below, including pain from surgery and decreased activity tolerance.  Patient currently requires supervision for ADLs and mobility in room, intermittent cueing to adhere to back precautions. Educated on back precautions, ADL compensatory techniques, recommendations, AE/DME and safety.  She will have support of spouse at home as needed.  Patient with no further OT needs identified at this time, OT will sign off.     Follow Up Recommendations  No OT follow up;Supervision - Intermittent    Equipment Recommendations  None recommended by OT    Recommendations for Other Services       Precautions / Restrictions Precautions Precautions: Back Precaution Booklet Issued: Yes (comment) Precaution Comments: reviewed precautions  Restrictions Weight Bearing Restrictions: No      Mobility Bed Mobility Overal bed mobility: Modified Independent Bed Mobility: Rolling;Sidelying to Sit;Sit to Sidelying Rolling: Modified independent (Device/Increase time) Sidelying to sit: Modified independent (Device/Increase time)     Sit to sidelying: Modified independent (Device/Increase time) General bed mobility comments: +rail, good recall technqiue and no assist    Transfers Overall transfer level: Needs assistance Equipment used: None Transfers: Sit to/from Stand Sit to Stand: Supervision         General transfer comment: cueing for posture, no physical assist required    Balance Overall balance assessment: No apparent balance deficits (not formally assessed)                                          ADL either performed or assessed with clinical judgement   ADL Overall ADL's : Needs assistance/impaired     Grooming: Supervision/safety;Standing   Upper Body Bathing: Supervision/ safety;Standing   Lower Body Bathing: Supervison/ safety;Sit to/from stand   Upper Body Dressing : Supervision/safety;Sitting   Lower Body Dressing: Supervision/safety;Sit to/from stand;Cueing for back precautions;Cueing for compensatory techniques   Toilet Transfer: Supervision/safety;Ambulation       Tub/ Shower Transfer: Tub transfer;Supervision/safety;Ambulation Tub/Shower Transfer Details (indicate cue type and reason): simulated in room Functional mobility during ADLs: Supervision/safety General ADL Comments: pt requires intermittent cueing for back precautions, reviewed ADL compesnatory techniques, AE/DME, and recommendations      Vision         Perception     Praxis      Pertinent Vitals/Pain Pain Assessment: Faces Pain Score: 4  Faces Pain Scale: Hurts a little bit Pain Location: back Pain Descriptors / Indicators: Sore;Operative site guarding Pain Intervention(s): Limited activity within patient's tolerance;Monitored during session;Repositioned     Hand Dominance     Extremity/Trunk Assessment Upper Extremity Assessment Upper Extremity Assessment: Overall WFL for tasks assessed   Lower Extremity Assessment Lower Extremity Assessment: Defer to PT evaluation LLE Deficits / Details: Some mild numbness around ankle LLE Sensation: decreased light touch   Cervical / Trunk Assessment Cervical / Trunk Assessment: Other exceptions Cervical / Trunk Exceptions: s/p back surgery   Communication Communication Communication: No difficulties   Cognition Arousal/Alertness: Awake/alert Behavior During Therapy: WFL for tasks assessed/performed Overall Cognitive Status: Within Functional Limits for tasks assessed  General  Comments  Bandage clean dry and intact.    Exercises     Shoulder Instructions      Home Living Family/patient expects to be discharged to:: Private residence Living Arrangements: Spouse/significant other Available Help at Discharge: Family;Available 24 hours/day Type of Home: House Home Access: Stairs to enter [employer] of Steps: 3 Entrance Stairs-Rails: Right Home Layout: One level     Bathroom Shower/Tub: Teacher, early years/pre: Handicapped height     Home Equipment: None          Prior Functioning/Environment Level of Independence: Independent        Comments: Does own ADLs/IADLs, drives. Loves to garden.        OT Problem List: Decreased activity tolerance;Decreased knowledge of use of DME or AE;Decreased knowledge of precautions;Pain      OT Treatment/Interventions:      OT Goals(Current goals can be found in the care plan section) Acute Rehab OT Goals Patient Stated Goal: to go home OT Goal Formulation: With patient  OT Frequency:     Barriers to D/C:            Co-evaluation              AM-PAC OT "6 Clicks" Daily Activity     Outcome Measure Help from another person eating meals?: None Help from another person taking care of personal grooming?: None Help from another person toileting, which includes using toliet, bedpan, or urinal?: None Help from another person bathing (including washing, rinsing, drying)?: None Help from another person to put on and taking off regular upper body clothing?: None Help from another person to put on and taking off regular lower body clothing?: None 6 Click Score: 24   End of Session Nurse Communication: Mobility status  Activity Tolerance: Patient tolerated treatment well Patient left: with call bell/phone within reach;Other (comment) (seated EOB )  OT Visit Diagnosis: Other abnormalities of gait and mobility (R26.89);Pain Pain - part of body:  (back)                Time:  8127-5170 OT Time Calculation (min): 22 min Charges:  OT General Charges $OT Visit: 1 Visit OT Evaluation $OT Eval Low Complexity: 1 Low  Jolaine Artist, OT Acute Rehabilitation Services Pager [phone] Office [phone]   Madison [name] 03/21/2020, 9:29 AM

## 2020-03-21 NOTE — Discharge Instructions (Signed)
Wound Care Leave incision open to air. You may shower. Do not scrub directly on incision.  Do not put any creams, lotions, or ointments on incision. Activity Walk each and every day, increasing distance each day. No lifting greater than 5 lbs.  Avoid bending, arching, and twisting. No driving for 2 weeks; may ride as a passenger locally. Diet Resume your normal diet.  Return to Work Will be discussed at you follow up appointment. Call Your Doctor If Any of These Occur Redness, drainage, or swelling at the wound.  Temperature greater than 101 degrees. Severe pain not relieved by pain medication. Incision starts to come apart. Follow Up Appt Call today for appointment in 2-4 weeks ([phone]) or for problems.  If you have any hardware placed in your spine, you will need an x-ray before your appointment.

## 2020-03-28 ENCOUNTER — Ambulatory Visit: Payer: Medicare Other

## 2020-04-22 NOTE — Progress Notes (Signed)
Cardiology Office Note   Date:  04/23/2020   ID:  Madison [name], Madison [name] 1945-08-24, MRN [id number]  PCP:  [name], DO  Cardiologist:   Minus Breeding, MD   Chief Complaint  Patient presents with  . Coronary Artery Disease      History of Present Illness: Madison [name] is a 75 y.o. female who for followup of CAD.  She had a negative Lexiscan Myoview and normal echo in 2018.  Since I last saw her she denies any cardiovascular symptoms.  The patient denies any new symptoms such as chest discomfort, neck or arm discomfort. There has been no new shortness of breath, PND or orthopnea. There have been no reported palpitations, presyncope or syncope.    Past Medical History:  Diagnosis Date  . CAD (coronary artery disease)    PCI of an occluded right coronary artery 2001. 70% LAD stenosis, 30% circumflex stenosis.  . Diverticulosis   . Hyperlipidemia   . Hypertension   . IBS (irritable bowel syndrome)   . Leukocytosis, unspecified 07/24/2012  . Myocardial infarction ([place]) 2001  . Need for prophylactic hormone replacement therapy (postmenopausal)   . Osteopenia   . Osteoporosis   . PONV (postoperative nausea and vomiting)   . Thyroid cancer [facility]) 2004   Dr. Clovia Cuff   . Thyroid nodule 2004  . Tobacco abuse     Past Surgical History:  Procedure Laterality Date  . APPENDECTOMY  1955  . BACK SURGERY  06/2000  . CARDIAC CATHETERIZATION  2001  . CHOLECYSTECTOMY  07/2002  . EYE SURGERY Bilateral 2019   cataract  . LIPOMA EXCISION Right 08/2003   r arm   . LUMBAR LAMINECTOMY/DECOMPRESSION MICRODISCECTOMY Left 03/20/2020   Procedure: LEFT LUMBAR FOUR-FIVE MICRODISCECTOMY;  Surgeon: Newman Pies, MD;  Location: [place];  Service: Neurosurgery;  Laterality: Left;  . peserie    . THYROIDECTOMY Bilateral 02/2003     Current Outpatient Medications  Medication Sig Dispense Refill  . amLODipine (NORVASC) 5 MG tablet TAKE ONE (1) TABLET EACH DAY (Patient taking differently:  Take 5 mg by mouth daily.) 90 tablet 3  . aspirin EC 81 MG tablet Take 1 tablet (81 mg total) by mouth daily. Swallow whole. 30 tablet 11  . busPIRone (BUSPAR) 15 MG tablet Take 2 tablets (30 mg total) by mouth 2 (two) times daily. 360 tablet 3  . conjugated estrogens (PREMARIN) vaginal cream 1 application 3 times weekly (Patient taking differently: Place 1 Applicatorful vaginally 3 (three) times a week.) 42.5 g 12  . fenofibrate 160 MG tablet 1 tablet daily (Patient taking differently: Take 160 mg by mouth daily.) 90 tablet 1  . levothyroxine (SYNTHROID) 125 MCG tablet Take 125 mcg by mouth daily before breakfast.    . LORazepam (ATIVAN) 1 MG tablet Take 0.5-1 tablets (0.5-1 mg total) by mouth daily as needed for anxiety. 30 tablet 1  . losartan (COZAAR) 100 MG tablet Take 1 tablet (100 mg total) by mouth daily. 90 tablet 3  . Melatonin 5 MG TABS Take 5 mg by mouth at bedtime.     . metoprolol tartrate (LOPRESSOR) 50 MG tablet TAKE ONE (1) TABLET EACH DAY (Patient taking differently: Take 50 mg by mouth daily.) 90 tablet 3  . nitroGLYCERIN (NITROSTAT) 0.4 MG SL tablet Place 1 tablet (0.4 mg total) under the tongue every 5 (five) minutes as needed for chest pain. 25 tablet 3  . rosuvastatin (CRESTOR) 40 MG tablet TAKE ONE (1) TABLET EACH DAY (  Patient taking differently: Take 40 mg by mouth daily.) 90 tablet 3  . Vitamin D, Ergocalciferol, (DRISDOL) 1.25 MG (50000 UNIT) CAPS capsule Take 1 capsule (50,000 Units total) by mouth every 7 (seven) days. 12 capsule 3   No current facility-administered medications for this visit.    Allergies:   Banana, Bupropion hcl, Chantix [varenicline], Latex, and Zetia [ezetimibe]    ROS:  Please see the history of present illness.   Otherwise, review of systems are positive for none.   All other systems are reviewed and negative.    PHYSICAL EXAM: VS:  BP (!) 180/92   Pulse 73   Ht 5' 5.5" (1.664 m)   Wt 159 lb (72.1 kg)   BMI 26.06 kg/m  , BMI Body mass  index is 26.06 kg/m. GENERAL:  Well appearing NECK:  No jugular venous distention, waveform within normal limits, carotid upstroke brisk and symmetric, no bruits, no thyromegaly LUNGS:  Clear to auscultation bilaterally CHEST:  Unremarkable HEART:  PMI not displaced or sustained,S1 and S2 within normal limits, no S3, no S4, no clicks, no rubs, no murmurs ABD:  Flat, positive bowel sounds normal in frequency in pitch, no bruits, no rebound, no guarding, no midline pulsatile mass, no hepatomegaly, no splenomegaly EXT:  2 plus pulses throughout, no edema, no cyanosis no clubbing   EKG:  EKG is  ordered today. The ekg ordered today demonstrates sinus rhythm, rate 73, right bundle branch block, no change from previous, no acute ST-T wave changes.   Recent Labs: 08/13/2019: ALT 15 12/28/2019: TSH 0.713 03/18/2020: BUN 15; Creatinine, Ser 0.88; Hemoglobin 16.0; Platelets 270; Potassium 3.5; Sodium 140    Lipid Panel    Component Value Date/Time   CHOL 113 07/02/2019 1200   CHOL 107 08/30/2012 0921   TRIG 123 07/02/2019 1200   TRIG 147 05/18/2016 1209   TRIG 165 (H) 08/30/2012 0921   HDL 28 (L) 07/02/2019 1200   HDL 22 (L) 05/18/2016 1209   HDL 32 (L) 08/30/2012 0921   CHOLHDL 4.0 07/02/2019 1200   LDLCALC 63 07/02/2019 1200   LDLCALC 69 01/17/2014 0850   LDLCALC 42 08/30/2012 0921      Wt Readings from Last 3 Encounters:  04/23/20 159 lb (72.1 kg)  03/20/20 157 lb (71.2 kg)  03/18/20 157 lb 9.6 oz (71.5 kg)      Other studies Reviewed: Additional studies/ records that were reviewed today include: None. Review of the above records demonstrates:  Please see elsewhere in the note.     ASSESSMENT AND PLAN:   CORONARY ARTERY DISEASE -  The patient's had no new symptoms since her 2018 stress test.  No change in therapy.   RBBB:      This is chronic.  No further evaluation.   TOBACCO ABUSE -  She has not been able to stop smoking.    DYSLIPIDEMIA -  LDL is LDL was 63.   No change in therapy.   HTN - The blood pressure is at target.  No change in therapy.     Current medicines are reviewed at length with the patient today.  The patient does not have concerns regarding medicines.  The following changes have been made:   None  Labs/ tests ordered today include: None  Orders Placed This Encounter  Procedures  . EKG 12-Lead     Disposition:   FU with me in one year.     Signed, Rollene Rotunda, MD  04/23/2020 4:23 PM  [facility]

## 2020-04-23 ENCOUNTER — Encounter: Payer: Self-pay | Admitting: Cardiology

## 2020-04-23 ENCOUNTER — Other Ambulatory Visit: Payer: Self-pay

## 2020-04-23 ENCOUNTER — Ambulatory Visit: Payer: Medicare Other | Admitting: Cardiology

## 2020-04-23 VITALS — BP 180/92 | HR 73 | Ht 65.5 in | Wt 159.0 lb

## 2020-04-23 DIAGNOSIS — E785 Hyperlipidemia, unspecified: Secondary | ICD-10-CM | POA: Diagnosis not present

## 2020-04-23 DIAGNOSIS — I4510 Unspecified right bundle-branch block: Secondary | ICD-10-CM

## 2020-04-23 DIAGNOSIS — I2510 Atherosclerotic heart disease of native coronary artery without angina pectoris: Secondary | ICD-10-CM | POA: Diagnosis not present

## 2020-04-23 MED ORDER — ASPIRIN EC 81 MG PO TBEC: 81.0000 mg | DELAYED_RELEASE_TABLET | Freq: Every day | ORAL | 11 refills | Status: AC

## 2020-04-23 NOTE — Patient Instructions (Signed)
Medication Instructions:  Please decrease Aspirin to 81 mg a day. Continue all other medications as listed.  *If you need a refill on your cardiac medications before your next appointment, please call your pharmacy*  Follow-Up: At [facility], you and your health needs are our priority.  As part of our continuing mission to provide you with exceptional heart care, we have created designated Provider Care Teams.  These Care Teams include your primary Cardiologist (physician) and Advanced Practice Providers (APPs -  Physician Assistants and Nurse Practitioners) who all work together to provide you with the care you need, when you need it.  We recommend signing up for the patient portal called "MyChart".  Sign up information is provided on this After Visit Summary.  MyChart is used to connect with patients for Virtual Visits (Telemedicine).  Patients are able to view lab/test results, encounter notes, upcoming appointments, etc.  Non-urgent messages can be sent to your provider as well.   To learn more about what you can do with MyChart, go to [email or web address].    Your next appointment:   12 month(s)  The format for your next appointment:   In Person  Provider:   Rollene Rotunda, MD   Thank you for choosing [facility]!!

## 2020-05-06 ENCOUNTER — Ambulatory Visit: Payer: Medicare Other

## 2020-05-13 DIAGNOSIS — C73 Malignant neoplasm of thyroid gland: Secondary | ICD-10-CM | POA: Diagnosis not present

## 2020-05-13 DIAGNOSIS — E890 Postprocedural hypothyroidism: Secondary | ICD-10-CM | POA: Diagnosis not present

## 2020-06-17 ENCOUNTER — Ambulatory Visit: Payer: Medicare Other

## 2020-06-17 ENCOUNTER — Other Ambulatory Visit: Payer: Self-pay

## 2020-06-17 ENCOUNTER — Ambulatory Visit
Admission: RE | Admit: 2020-06-17 | Discharge: 2020-06-17 | Disposition: A | Discharge status: Home / Self Care | Payer: Medicare Other | Source: Ambulatory Visit | Attending: Family Medicine | Admitting: Family Medicine

## 2020-06-17 DIAGNOSIS — Z1231 Encounter for screening mammogram for malignant neoplasm of breast: Secondary | ICD-10-CM | POA: Diagnosis not present

## 2020-06-26 DIAGNOSIS — Z9889 Other specified postprocedural states: Secondary | ICD-10-CM | POA: Insufficient documentation

## 2020-06-27 ENCOUNTER — Ambulatory Visit (INDEPENDENT_AMBULATORY_CARE_PROVIDER_SITE_OTHER): Payer: Medicare Other | Admitting: Family Medicine

## 2020-06-27 ENCOUNTER — Other Ambulatory Visit: Payer: Self-pay

## 2020-06-27 VITALS — BP 134/77 | HR 65 | Temp 96.9°F | Ht 65.0 in | Wt 163.0 lb

## 2020-06-27 DIAGNOSIS — Z9889 Other specified postprocedural states: Secondary | ICD-10-CM

## 2020-06-27 DIAGNOSIS — I10 Essential (primary) hypertension: Secondary | ICD-10-CM | POA: Diagnosis not present

## 2020-06-27 DIAGNOSIS — L570 Actinic keratosis: Secondary | ICD-10-CM

## 2020-06-27 DIAGNOSIS — Z0000 Encounter for general adult medical examination without abnormal findings: Secondary | ICD-10-CM

## 2020-06-27 DIAGNOSIS — E890 Postprocedural hypothyroidism: Secondary | ICD-10-CM

## 2020-06-27 DIAGNOSIS — B351 Tinea unguium: Secondary | ICD-10-CM

## 2020-06-27 DIAGNOSIS — E7800 Pure hypercholesterolemia, unspecified: Secondary | ICD-10-CM | POA: Diagnosis not present

## 2020-06-27 DIAGNOSIS — F17200 Nicotine dependence, unspecified, uncomplicated: Secondary | ICD-10-CM | POA: Diagnosis not present

## 2020-06-27 DIAGNOSIS — Z23 Encounter for immunization: Secondary | ICD-10-CM

## 2020-06-27 DIAGNOSIS — N814 Uterovaginal prolapse, unspecified: Secondary | ICD-10-CM | POA: Diagnosis not present

## 2020-06-27 DIAGNOSIS — F411 Generalized anxiety disorder: Secondary | ICD-10-CM

## 2020-06-27 DIAGNOSIS — Z0001 Encounter for general adult medical examination with abnormal findings: Secondary | ICD-10-CM | POA: Diagnosis not present

## 2020-06-27 DIAGNOSIS — Z85850 Personal history of malignant neoplasm of thyroid: Secondary | ICD-10-CM

## 2020-06-27 MED ORDER — PREMARIN 0.625 MG/GM VA CREA: 1.0000 | TOPICAL_CREAM | VAGINAL | 99 refills | Status: DC

## 2020-06-27 MED ORDER — METOPROLOL TARTRATE 50 MG PO TABS: ORAL_TABLET | ORAL | 3 refills | Status: DC

## 2020-06-27 MED ORDER — LORAZEPAM 1 MG PO TABS: 0.5000 mg | ORAL_TABLET | Freq: Every day | ORAL | 1 refills | Status: DC | PRN

## 2020-06-27 MED ORDER — AMLODIPINE BESYLATE 5 MG PO TABS: ORAL_TABLET | ORAL | 3 refills | Status: DC

## 2020-06-27 MED ORDER — LOSARTAN POTASSIUM 100 MG PO TABS: 100.0000 mg | ORAL_TABLET | Freq: Every day | ORAL | 3 refills | Status: DC

## 2020-06-27 MED ORDER — FENOFIBRATE 160 MG PO TABS: 160.0000 mg | ORAL_TABLET | Freq: Every day | ORAL | 3 refills | Status: DC

## 2020-06-27 MED ORDER — BUSPIRONE HCL 30 MG PO TABS: 30.0000 mg | ORAL_TABLET | Freq: Two times a day (BID) | ORAL | 3 refills | Status: DC

## 2020-06-27 NOTE — Progress Notes (Signed)
Madison [name] is a 75 y.o. female presents to office today for annual physical exam examination.    Concerns today include: 1.  Nail fungus Patient with ongoing right-sided great toenail fungus.  She reports thickening and discoloration.  She failed oral Lamisil last year.  She wonders if there are any alternatives to this.  2.  Low back pain status post microdiscectomy in the lumbar spine Patient continues to be followed very closely by her specialist.  She notes that immediately following the surgery she felt better but as of late symptoms have been becoming more prominent again, particularly in the left lower extremity.  She was advised to continue gabapentin and this seems to help with symptoms.  She does note that she is not as ambulatory/physically active as she had been but she does try walking daily.  3.  Hypothyroidism, is postsurgical Patient had abnormal thyroid labs in January.  She will be seeing her endocrinologist again soon.  She continues to take Synthroid 125 mcg daily except for 1/2 tablet on Sundays.  No reports of change in bowel habits, energy.  She has IBS at baseline.  No tremor.  Occupation: retired,  Substance use: Tobacco Diet: balanced, Exercise: limited now as above/ walks daily Last eye exam: UTD Last dental exam: UTD Last colonoscopy: UTD Last mammogram: UTD Last pap smear: n/a >65, desires pelvic exam but not today. Refills needed today: all Immunizations needed:  Immunization History  Administered Date(s) Administered  . Fluad Quad(high Dose 65+) 01/29/2019, 01/21/2020  . Influenza, High Dose Seasonal PF 03/19/2015, 02/02/2016, 01/25/2017, 02/02/2018  . Influenza,inj,Quad PF,6+ Mos 01/25/2013, 01/31/2014  . PFIZER(Purple Top)SARS-COV-2 Vaccination 05/08/2019, 05/27/2019, 02/08/2020  . Pneumococcal Conjugate-13 05/15/2013  . Pneumococcal Polysaccharide-23 02/02/2012  . Tdap 05/26/2007  . Zoster 11/26/2009     Past Medical History:  Diagnosis  Date  . CAD (coronary artery disease)    PCI of an occluded right coronary artery 2001. 70% LAD stenosis, 30% circumflex stenosis.  . Diverticulosis   . Hyperlipidemia   . Hypertension   . IBS (irritable bowel syndrome)   . Leukocytosis, unspecified 07/24/2012  . Myocardial infarction ([place]) 2001  . Need for prophylactic hormone replacement therapy (postmenopausal)   . Osteopenia   . Osteoporosis   . PONV (postoperative nausea and vomiting)   . Thyroid cancer [facility]) 2004   Dr. Clovia Cuff   . Thyroid nodule 2004  . Tobacco abuse    Social History   Socioeconomic History  . Marital status: Married    Spouse name: Not on file  . Number of children: 2  . Years of education: Not on file  . Highest education level: Not on file  Occupational History  . Occupation: Retired     Comment: Engineering geologist  Tobacco Use  . Smoking status: Current Every Day Smoker    Packs/day: 1.00    Years: 40.00    Pack years: 40.00    Types: Cigarettes  . Smokeless tobacco: Never Used  Vaping Use  . Vaping Use: Never used  Substance and Sexual Activity  . Alcohol use: No  . Drug use: No  . Sexual activity: Not Currently  Other Topics Concern  . Not on file  Social History Narrative   Dr.  Cyril Loosen -(surgeon) [phone]   Dr. Marilynne Halsted- Endocrinologist  [facility]    Social Determinants of Health   Financial Resource Strain: Not on file  Food Insecurity: Not on file  Transportation Needs: Not on file  Physical Activity: Not on  file  Stress: Not on file  Social Connections: Not on file  Intimate Partner Violence: Not on file   Past Surgical History:  Procedure Laterality Date  . APPENDECTOMY  1955  . BACK SURGERY  06/2000  . CARDIAC CATHETERIZATION  2001  . CHOLECYSTECTOMY  07/2002  . EYE SURGERY Bilateral 2019   cataract  . LIPOMA EXCISION Right 08/2003   r arm   . LUMBAR LAMINECTOMY/DECOMPRESSION MICRODISCECTOMY Left 03/20/2020   Procedure: LEFT LUMBAR FOUR-FIVE MICRODISCECTOMY;  Surgeon:  Newman Pies, MD;  Location: [place];  Service: Neurosurgery;  Laterality: Left;  . peserie    . THYROIDECTOMY Bilateral 02/2003   Family History  Problem Relation Age of Onset  . Coronary artery disease Father   . Heart disease Father   . Cirrhosis Mother   . Alcohol abuse Mother   . Cancer Other        family history   . Hypertension Maternal Grandmother     Current Outpatient Medications:  .  amLODipine (NORVASC) 5 MG tablet, TAKE ONE (1) TABLET EACH DAY (Patient taking differently: Take 5 mg by mouth daily.), Disp: 90 tablet, Rfl: 3 .  aspirin EC 81 MG tablet, Take 1 tablet (81 mg total) by mouth daily. Swallow whole., Disp: 30 tablet, Rfl: 11 .  busPIRone (BUSPAR) 15 MG tablet, Take 2 tablets (30 mg total) by mouth 2 (two) times daily., Disp: 360 tablet, Rfl: 3 .  conjugated estrogens (PREMARIN) vaginal cream, 1 application 3 times weekly (Patient taking differently: Place 1 Applicatorful vaginally 3 (three) times a week.), Disp: 42.5 g, Rfl: 12 .  fenofibrate 160 MG tablet, 1 tablet daily (Patient taking differently: Take 160 mg by mouth daily.), Disp: 90 tablet, Rfl: 1 .  levothyroxine (SYNTHROID) 125 MCG tablet, Take 125 mcg by mouth daily before breakfast., Disp: , Rfl:  .  LORazepam (ATIVAN) 1 MG tablet, Take 0.5-1 tablets (0.5-1 mg total) by mouth daily as needed for anxiety., Disp: 30 tablet, Rfl: 1 .  losartan (COZAAR) 100 MG tablet, Take 1 tablet (100 mg total) by mouth daily., Disp: 90 tablet, Rfl: 3 .  Melatonin 5 MG TABS, Take 5 mg by mouth at bedtime. , Disp: , Rfl:  .  metoprolol tartrate (LOPRESSOR) 50 MG tablet, TAKE ONE (1) TABLET EACH DAY (Patient taking differently: Take 50 mg by mouth daily.), Disp: 90 tablet, Rfl: 3 .  nitroGLYCERIN (NITROSTAT) 0.4 MG SL tablet, Place 1 tablet (0.4 mg total) under the tongue every 5 (five) minutes as needed for chest pain., Disp: 25 tablet, Rfl: 3 .  rosuvastatin (CRESTOR) 40 MG tablet, TAKE ONE (1) TABLET EACH DAY (Patient  taking differently: Take 40 mg by mouth daily.), Disp: 90 tablet, Rfl: 3 .  Vitamin D, Ergocalciferol, (DRISDOL) 1.25 MG (50000 UNIT) CAPS capsule, Take 1 capsule (50,000 Units total) by mouth every 7 (seven) days., Disp: 12 capsule, Rfl: 3  Allergies  Allergen Reactions  . Banana Itching and Swelling    Throat and ears itch,ears feel like they swell  . Bupropion Hcl     REACTION: paranoia  . Chantix [Varenicline]     paranoia  . Latex Rash  . Zetia [Ezetimibe]     Myalgia     ROS: Review of Systems Pertinent items noted in HPI and remainder of comprehensive ROS otherwise negative.    Physical exam BP 134/77   Pulse 65   Temp (!) 96.9 F (36.1 C) (Temporal)   Ht 5' 5"  (1.651 m)   Wt  163 lb (73.9 kg)   SpO2 96%   BMI 27.12 kg/m  General appearance: alert, cooperative, appears stated age and no distress Head: Normocephalic, without obvious abnormality, atraumatic Eyes: negative findings: lids and lashes normal, conjunctivae and sclerae normal, corneas clear and pupils equal, round, reactive to light and accomodation Ears: normal TM's and external ear canals both ears Nose: Nares normal. Septum midline. Mucosa normal. No drainage or sinus tenderness. Throat: lips, mucosa, and tongue normal; teeth and gums normal Neck: no adenopathy, supple, symmetrical, trachea midline and thyroid surgically absent. no tenderness/mass/nodules Back: symmetric, no curvature. ROM normal. No CVA tenderness. Lungs: clear to auscultation bilaterally Heart: regular rate and rhythm, S1, S2 normal, no murmur, click, rub or gallop Abdomen: soft, non-tender; bowel sounds normal; no masses,  no organomegaly and protuberant Extremities: extremities normal, atraumatic, no cyanosis or edema Pulses: 2+ and symmetric Skin: She has an actinic keratosis noted along the left dorsal forearm.  She has seborrheic keratoses noted along the right upper extremity and right lower leg. Lymph nodes: Cervical,  supraclavicular, and axillary nodes normal. Neurologic: Alert and oriented X 3, normal strength and tone. Normal symmetric reflexes. Normal coordination and gait  Psych:  Mood is stable  Depression screen [facility] 2/9 06/27/2020 01/08/2020 12/28/2019  Decreased Interest 0 0 0  Down, Depressed, Hopeless 0 0 1  PHQ - 2 Score 0 0 1  Altered sleeping 0 - 3  Tired, decreased energy 0 - 3  Change in appetite 0 - 0  Feeling bad or failure about yourself  0 - 0  Trouble concentrating 0 - 0  Moving slowly or fidgety/restless 0 - 0  Suicidal thoughts 0 - 0  PHQ-9 Score 0 - 7  Difficult doing work/chores - - Somewhat difficult  Some recent data might be hidden   Cryotherapy Procedure:  Risks and benefits of procedure were reviewed with the patient.  Verbal consent obtained.  Lesion of concern was identified and located on left forearm.  Liquid nitrogen was applied to area of concern and extending out 1 millimeters beyond the border of the lesion.  Treated area was allowed to come back to room temperature before treating it a second time.  Patient tolerated procedure well and there were no immediate complications.  Home care instructions were reviewed with the patient and a handout was provided.   Assessment/ Plan: Kaylynn H Axon here for annual physical exam.   Annual physical exam  Onychomycosis of great toe  Essential hypertension - Plan: CMP14+EGFR, losartan (COZAAR) 100 MG tablet, metoprolol tartrate (LOPRESSOR) 50 MG tablet, amLODipine (NORVASC) 5 MG tablet  Pure hypercholesterolemia - Plan: Lipid Panel, CMP14+EGFR, fenofibrate 160 MG tablet  Postoperative hypothyroidism - Plan: CANCELED: Thyroid Panel With TSH  History of thyroid cancer - Plan: CBC with Differential  GAD (generalized anxiety disorder) - Plan: LORazepam (ATIVAN) 1 MG tablet, busPIRone (BUSPAR) 30 MG tablet  History of lumbar discectomy  Cystocele with prolapse - Plan: conjugated estrogens (PREMARIN) vaginal  cream  TOBACCO ABUSE  Need for tetanus booster  Actinic keratoses  Offered referral to podiatry but she would like hold off on this for now.  She is refractory to Lamisil but we discussed that perhaps she would be offered this option again and/or offered extraction of the nail.  Blood pressures controlled.  Continue current regimen  Check fasting lipid panel  She will be seeing her endocrinologist soon so they will obtain her thyroid levels again.  Current regimen is 125 mcg daily except for half  tablet on Sunday  Anxiety stable.  Rare use of Ativan.  Current prescription is expired.  Continue BuSpar 30 mg twice daily.  Have adjusted to the 30 mg tablet for ease of administration The Narcotic Database has been reviewed.  There were no red flags. Opioid s/p surgery appropriate.    Continues to take gabapentin status post discectomy due to ongoing symptoms  She is welcome to schedule pelvic exam.  Advised her to remove pessary prior to exam.  Continue Premarin for now  Counseled on tobacco use.  She is down to three quarters of a pack from 1 pack/day.  No evidence of oral, oropharyngeal or lung cancers on today's exam  Actinic keratosis treated with cryotherapy.  No immediate complications.  Home care instructions reviewed.  Follow prn  Tetanus shot administered  Counseled on healthy lifestyle choices, including diet (rich in fruits, vegetables and lean meats and low in salt and simple carbohydrates) and exercise (at least 30 minutes of moderate physical activity daily).  Patient to follow up in 1 year for annual exam or sooner if needed.  Emeril Stille M. Lajuana Ripple, DO

## 2020-06-27 NOTE — Patient Instructions (Signed)
You had labs performed today.  You will be contacted with the results of the labs once they are available, usually in the next 3 business days for routine lab work.  If you have an active my chart account, they will be released to your MyChart.  If you prefer to have these labs released to you via telephone, please let us know.  If you had a pap smear or biopsy performed, expect to be contacted in about 7-10 days.  Let me know if you want the podiatry referral for the nail.  STOP smoking.  Health Maintenance, Female Adopting a healthy lifestyle and getting preventive care are important in promoting health and wellness. Ask your health care provider about:  The right schedule for you to have regular tests and exams.  Things you can do on your own to prevent diseases and keep yourself healthy. What should I know about diet, weight, and exercise? Eat a healthy diet  Eat a diet that includes plenty of vegetables, fruits, low-fat dairy products, and lean protein.  Do not eat a lot of foods that are high in solid fats, added sugars, or sodium.   Maintain a healthy weight Body mass index (BMI) is used to identify weight problems. It estimates body fat based on height and weight. Your health care provider can help determine your BMI and help you achieve or maintain a healthy weight. Get regular exercise Get regular exercise. This is one of the most important things you can do for your health. Most adults should:  Exercise for at least 150 minutes each week. The exercise should increase your heart rate and make you sweat (moderate-intensity exercise).  Do strengthening exercises at least twice a week. This is in addition to the moderate-intensity exercise.  Spend less time sitting. Even light physical activity can be beneficial. Watch cholesterol and blood lipids Have your blood tested for lipids and cholesterol at 74 years of age, then have this test every 5 years. Have your cholesterol  levels checked more often if:  Your lipid or cholesterol levels are high.  You are older than 74 years of age.  You are at high risk for heart disease. What should I know about cancer screening? Depending on your health history and family history, you may need to have cancer screening at various ages. This may include screening for:  Breast cancer.  Cervical cancer.  Colorectal cancer.  Skin cancer.  Lung cancer. What should I know about heart disease, diabetes, and high blood pressure? Blood pressure and heart disease  High blood pressure causes heart disease and increases the risk of stroke. This is more likely to develop in people who have high blood pressure readings, are of African descent, or are overweight.  Have your blood pressure checked: ? Every 3-5 years if you are 60-32 years of age. ? Every year if you are 67 years old or older. Diabetes Have regular diabetes screenings. This checks your fasting blood sugar level. Have the screening done:  Once every three years after age 15 if you are at a normal weight and have a low risk for diabetes.  More often and at a younger age if you are overweight or have a high risk for diabetes. What should I know about preventing infection? Hepatitis B If you have a higher risk for hepatitis B, you should be screened for this virus. Talk with your health care provider to find out if you are at risk for hepatitis B infection. Hepatitis C  Testing is recommended for:  Everyone born from [birth year] through 1964.  Anyone with known risk factors for hepatitis C. Sexually transmitted infections (STIs)  Get screened for STIs, including gonorrhea and chlamydia, if: ? You are sexually active and are younger than 74 years of age. ? You are older than 74 years of age and your health care provider tells you that you are at risk for this type of infection. ? Your sexual activity has changed since you were last screened, and you are at increased  risk for chlamydia or gonorrhea. Ask your health care provider if you are at risk.  Ask your health care provider about whether you are at high risk for HIV. Your health care provider may recommend a prescription medicine to help prevent HIV infection. If you choose to take medicine to prevent HIV, you should first get tested for HIV. You should then be tested every 3 months for as long as you are taking the medicine. Pregnancy  If you are about to stop having your period (premenopausal) and you may become pregnant, seek counseling before you get pregnant.  Take 400 to 800 micrograms (mcg) of folic acid every day if you become pregnant.  Ask for birth control (contraception) if you want to prevent pregnancy. Osteoporosis and menopause Osteoporosis is a disease in which the bones lose minerals and strength with aging. This can result in bone fractures. If you are 53 years old or older, or if you are at risk for osteoporosis and fractures, ask your health care provider if you should:  Be screened for bone loss.  Take a calcium or vitamin D supplement to lower your risk of fractures.  Be given hormone replacement therapy (HRT) to treat symptoms of menopause. Follow these instructions at home: Lifestyle  Do not use any products that contain nicotine or tobacco, such as cigarettes, e-cigarettes, and chewing tobacco. If you need help quitting, ask your health care provider.  Do not use street drugs.  Do not share needles.  Ask your health care provider for help if you need support or information about quitting drugs. Alcohol use  Do not drink alcohol if: ? Your health care provider tells you not to drink. ? You are pregnant, may be pregnant, or are planning to become pregnant.  If you drink alcohol: ? Limit how much you use to 0-1 drink a day. ? Limit intake if you are breastfeeding.  Be aware of how much alcohol is in your drink. In the [place] one drink equals one 12 oz bottle of beer  (355 mL), one 5 oz glass of wine (148 mL), or one 1 oz glass of hard liquor (44 mL). General instructions  Schedule regular health, dental, and eye exams.  Stay current with your vaccines.  Tell your health care provider if: ? You often feel depressed. ? You have ever been abused or do not feel safe at home. Summary  Adopting a healthy lifestyle and getting preventive care are important in promoting health and wellness.  Follow your health care provider's instructions about healthy diet, exercising, and getting tested or screened for diseases.  Follow your health care provider's instructions on monitoring your cholesterol and blood pressure. This information is not intended to replace advice given to you by your health care provider. Make sure you discuss any questions you have with your health care provider. Document Revised: 03/29/2018 Document Reviewed: 03/29/2018 Elsevier Patient Education  2021 [employer].

## 2020-06-27 NOTE — Addendum Note (Signed)
Addended byCarrolyn Leigh on: 06/27/2020 12:42 PM   Modules accepted: Orders

## 2020-06-28 LAB — CMP14+EGFR
ALT: 16 IU/L (ref 0–32)
AST: 19 IU/L (ref 0–40)
Albumin/Globulin Ratio: 1.7 (ref 1.2–2.2)
Albumin: 4.4 g/dL (ref 3.7–4.7)
Alkaline Phosphatase: 54 IU/L (ref 44–121)
BUN/Creatinine Ratio: 19 (ref 12–28)
BUN: 16 mg/dL (ref 8–27)
Bilirubin Total: 0.3 mg/dL (ref 0.0–1.2)
CO2: 19 mmol/L — ABNORMAL LOW (ref 20–29)
Calcium: 9.2 mg/dL (ref 8.7–10.3)
Chloride: 107 mmol/L — ABNORMAL HIGH (ref 96–106)
Creatinine, Ser: 0.86 mg/dL (ref 0.57–1.00)
Globulin, Total: 2.6 g/dL (ref 1.5–4.5)
Glucose: 102 mg/dL — ABNORMAL HIGH (ref 65–99)
Potassium: 4.7 mmol/L (ref 3.5–5.2)
Sodium: 143 mmol/L (ref 134–144)
Total Protein: 7 g/dL (ref 6.0–8.5)
eGFR: 71 mL/min/{1.73_m2} (ref >59)

## 2020-06-28 LAB — CBC WITH DIFFERENTIAL/PLATELET
Basophils Absolute: 0.1 10*3/uL (ref 0.0–0.2)
Basos: 1 %
EOS (ABSOLUTE): 0.4 10*3/uL (ref 0.0–0.4)
Eos: 4 %
Hematocrit: 48.5 % — ABNORMAL HIGH (ref 34.0–46.6)
Hemoglobin: 16.4 g/dL — ABNORMAL HIGH (ref 11.1–15.9)
Immature Grans (Abs): 0 10*3/uL (ref 0.0–0.1)
Immature Granulocytes: 0 %
Lymphocytes Absolute: 3.2 10*3/uL — ABNORMAL HIGH (ref 0.7–3.1)
Lymphs: 33 %
MCH: 30.9 pg (ref 26.6–33.0)
MCHC: 33.8 g/dL (ref 31.5–35.7)
MCV: 92 fL (ref 79–97)
Monocytes Absolute: 0.7 10*3/uL (ref 0.1–0.9)
Monocytes: 7 %
Neutrophils Absolute: 5.4 10*3/uL (ref 1.4–7.0)
Neutrophils: 55 %
Platelets: 244 10*3/uL (ref 150–450)
RBC: 5.3 x10E6/uL — ABNORMAL HIGH (ref 3.77–5.28)
RDW: 12.8 % (ref 11.7–15.4)
WBC: 9.8 10*3/uL (ref 3.4–10.8)

## 2020-06-28 LAB — LIPID PANEL
Chol/HDL Ratio: 4.8 ratio — ABNORMAL HIGH (ref 0.0–4.4)
Cholesterol, Total: 114 mg/dL (ref 100–199)
HDL: 24 mg/dL — ABNORMAL LOW (ref >39)
LDL Chol Calc (NIH): 63 mg/dL (ref 0–99)
Triglycerides: 152 mg/dL — ABNORMAL HIGH (ref 0–149)
VLDL Cholesterol Cal: 27 mg/dL (ref 5–40)

## 2020-07-14 DIAGNOSIS — E890 Postprocedural hypothyroidism: Secondary | ICD-10-CM | POA: Diagnosis not present

## 2020-08-05 DIAGNOSIS — M5116 Intervertebral disc disorders with radiculopathy, lumbar region: Secondary | ICD-10-CM | POA: Diagnosis not present

## 2020-11-03 DIAGNOSIS — F17200 Nicotine dependence, unspecified, uncomplicated: Secondary | ICD-10-CM | POA: Diagnosis not present

## 2020-11-11 DIAGNOSIS — M5116 Intervertebral disc disorders with radiculopathy, lumbar region: Secondary | ICD-10-CM | POA: Diagnosis not present

## 2020-11-11 DIAGNOSIS — I10 Essential (primary) hypertension: Secondary | ICD-10-CM | POA: Diagnosis not present

## 2020-11-20 DIAGNOSIS — M545 Low back pain, unspecified: Secondary | ICD-10-CM | POA: Diagnosis not present

## 2020-11-20 DIAGNOSIS — M5116 Intervertebral disc disorders with radiculopathy, lumbar region: Secondary | ICD-10-CM | POA: Diagnosis not present

## 2020-11-25 DIAGNOSIS — M5442 Lumbago with sciatica, left side: Secondary | ICD-10-CM | POA: Diagnosis not present

## 2020-12-09 ENCOUNTER — Other Ambulatory Visit: Payer: Self-pay | Admitting: Neurosurgery

## 2020-12-23 ENCOUNTER — Other Ambulatory Visit: Payer: Self-pay | Admitting: Neurosurgery

## 2020-12-25 NOTE — Progress Notes (Signed)
Surgical Instructions    Your procedure is scheduled on Thursday, September 22nd, 2022.   Report to [facility] Main Entrance "A" at 09:50 A.M., then check in with the Admitting office.  Call this number if you have problems the morning of surgery:  [phone]   If you have any questions prior to your surgery date call [phone]: Open Monday-Friday 8am-4pm    Remember:  Do not eat or drink after midnight the night before your surgery     Take these medicines the morning of surgery with A SIP OF WATER:  amLODipine (NORVASC)  busPIRone (BUSPAR)  Fenofibrate gabapentin (NEURONTIN)  levothyroxine (SYNTHROID) metoprolol tartrate (LOPRESSOR) rosuvastatin (CRESTOR)  If needed:  LORazepam (ATIVAN) nitroGLYCERIN (NITROSTAT)   Follow your surgeon's instructions on when to stop Aspirin.  If no instructions were given by your surgeon then you will need to call the office to get those instructions.     As of today, STOP taking any Aspirin (unless otherwise instructed by your surgeon) Aleve, Naproxen, Ibuprofen, Motrin, Advil, Goody's, BC's, all herbal medications, fish oil, and all vitamins.           Do not wear jewelry or makeup Do not wear lotions, powders, perfumes, or deodorant. Do not shave 48 hours prior to surgery.   Do not bring valuables to the hospital. DO Not wear nail polish, gel polish, artificial nails, or any other type of covering on natural nails including finger and toenails. If patients have artificial nails, gel coating, etc. that need to be removed by a nail salon please have this removed prior to surgery or surgery may need to be canceled/delayed if the surgeon/ anesthesia feels like the patient is unable to be adequately monitored.              [facility] is not responsible for any belongings or valuables.  Do NOT Smoke (Tobacco/Vaping)  24 hours prior to your procedure If you use a CPAP at night, you may bring all equipment for your overnight stay.    Contacts, glasses, dentures or bridgework may not be worn into surgery, please bring cases for these belongings   For patients admitted to the hospital, discharge time will be determined by your treatment team.   Patients discharged the day of surgery will not be allowed to drive home, and someone needs to stay with them for 24 hours.  ONLY 1 SUPPORT PERSON MAY BE PRESENT WHILE YOU ARE IN SURGERY. IF YOU ARE TO BE ADMITTED ONCE YOU ARE IN YOUR ROOM YOU WILL BE ALLOWED TWO (2) VISITORS.  Minor children may have two parents present. Special consideration for safety and communication needs will be reviewed on a case by case basis.  Special instructions:    Oral Hygiene is also important to reduce your risk of infection.  Remember - BRUSH YOUR TEETH THE MORNING OF SURGERY WITH YOUR REGULAR TOOTHPASTE   [place]- Preparing For Surgery  Before surgery, you can play an important role. Because skin is not sterile, your skin needs to be as free of germs as possible. You can reduce the number of germs on your skin by washing with CHG (chlorahexidine gluconate) Soap before surgery.  CHG is an antiseptic cleaner which kills germs and bonds with the skin to continue killing germs even after washing.     Please do not use if you have an allergy to CHG or antibacterial soaps. If your skin becomes reddened/irritated stop using the CHG.  Do not shave (including legs  and underarms) for at least 48 hours prior to first CHG shower. It is OK to shave your face.  Please follow these instructions carefully.     Shower the NIGHT BEFORE SURGERY and the MORNING OF SURGERY with CHG Soap.   If you chose to wash your hair, wash your hair first as usual with your normal shampoo. After you shampoo, rinse your hair and body thoroughly to remove the shampoo.  Then [employer] and genitals (private parts) with your normal soap and rinse thoroughly to remove soap.  After that Use CHG Soap as you would any other liquid  soap. You can apply CHG directly to the skin and wash gently with a scrungie or a clean washcloth.   Apply the CHG Soap to your body ONLY FROM THE NECK DOWN.  Do not use on open wounds or open sores. Avoid contact with your eyes, ears, mouth and genitals (private parts). Wash Face and genitals (private parts)  with your normal soap.   Wash thoroughly, paying special attention to the area where your surgery will be performed.  Thoroughly rinse your body with warm water from the neck down.  DO NOT shower/wash with your normal soap after using and rinsing off the CHG Soap.  Pat yourself dry with a CLEAN TOWEL.  Wear CLEAN PAJAMAS to bed the night before surgery  Place CLEAN SHEETS on your bed the night before your surgery  DO NOT SLEEP WITH PETS.   Day of Surgery:  Take a shower with CHG soap. Wear Clean/Comfortable clothing the morning of surgery Do not apply any deodorants/lotions.   Remember to brush your teeth WITH YOUR REGULAR TOOTHPASTE.   Please read over the following fact sheets that you were given.

## 2020-12-26 ENCOUNTER — Encounter (HOSPITAL_COMMUNITY)
Admission: RE | Admit: 2020-12-26 | Discharge: 2020-12-26 | Disposition: A | Discharge status: Home / Self Care | Payer: Medicare Other | Source: Ambulatory Visit | Attending: Neurosurgery | Admitting: Neurosurgery

## 2020-12-26 ENCOUNTER — Other Ambulatory Visit: Payer: Self-pay

## 2020-12-26 ENCOUNTER — Encounter (HOSPITAL_COMMUNITY): Payer: Self-pay

## 2020-12-26 DIAGNOSIS — Z01812 Encounter for preprocedural laboratory examination: Secondary | ICD-10-CM | POA: Insufficient documentation

## 2020-12-26 HISTORY — DX: Anxiety disorder, unspecified: F41.9

## 2020-12-26 LAB — CBC
HCT: 51.1 % — ABNORMAL HIGH (ref 36.0–46.0)
Hemoglobin: 16.5 g/dL — ABNORMAL HIGH (ref 12.0–15.0)
MCH: 30.8 pg (ref 26.0–34.0)
MCHC: 32.3 g/dL (ref 30.0–36.0)
MCV: 95.3 fL (ref 80.0–100.0)
Platelets: 239 10*3/uL (ref 150–400)
RBC: 5.36 MIL/uL — ABNORMAL HIGH (ref 3.87–5.11)
RDW: 13.6 % (ref 11.5–15.5)
WBC: 12 10*3/uL — ABNORMAL HIGH (ref 4.0–10.5)
nRBC: 0 % (ref 0.0–0.2)

## 2020-12-26 LAB — TYPE AND SCREEN
ABO/RH(D): B POS
Antibody Screen: NEGATIVE

## 2020-12-26 LAB — BASIC METABOLIC PANEL
Anion gap: 9 (ref 5–15)
BUN: 15 mg/dL (ref 8–23)
CO2: 22 mmol/L (ref 22–32)
Calcium: 9 mg/dL (ref 8.9–10.3)
Chloride: 107 mmol/L (ref 98–111)
Creatinine, Ser: 0.79 mg/dL (ref 0.44–1.00)
GFR, Estimated: >60 mL/min (ref >60)
Glucose, Bld: 106 mg/dL — ABNORMAL HIGH (ref 70–99)
Potassium: 3.8 mmol/L (ref 3.5–5.1)
Sodium: 138 mmol/L (ref 135–145)

## 2020-12-26 LAB — SURGICAL PCR SCREEN
MRSA, PCR: NEGATIVE
Staphylococcus aureus: NEGATIVE

## 2020-12-26 NOTE — Progress Notes (Signed)
PCP - Dr. Ronnie Doss Cardiologist - Dr. Minus Breeding Endocrinologist: Dr. Judieth Keens  PPM/ICD - Denies  Chest x-ray - N/A EKG - 04/23/20 Stress Test - 03/15/17 ECHO - 02/25/17 Cardiac Cath - 2001 (CE)  Sleep Study - Denies  Patient denies having diabetes.  Blood Thinner Instructions: N/A Aspirin Instructions: ASA LD: 01/02/21  ERAS Protcol - No  COVID TEST- Scheduled 01/05/21 @ 1:45 PM   Anesthesia review: Yes, cardiac hx  Patient denies shortness of breath, fever, cough and chest pain at PAT appointment   All instructions explained to the patient, with a verbal understanding of the material. Patient agrees to go over the instructions while at home for a better understanding. Patient also instructed to self quarantine after being tested for COVID-19. The opportunity to ask questions was provided.

## 2020-12-29 ENCOUNTER — Encounter: Payer: Self-pay | Admitting: Family Medicine

## 2020-12-29 ENCOUNTER — Other Ambulatory Visit: Payer: Self-pay

## 2020-12-29 ENCOUNTER — Ambulatory Visit (INDEPENDENT_AMBULATORY_CARE_PROVIDER_SITE_OTHER): Payer: Medicare Other | Admitting: Family Medicine

## 2020-12-29 VITALS — BP 154/81 | HR 71 | Temp 97.1°F | Ht 65.0 in | Wt 163.8 lb

## 2020-12-29 DIAGNOSIS — F411 Generalized anxiety disorder: Secondary | ICD-10-CM

## 2020-12-29 DIAGNOSIS — D235 Other benign neoplasm of skin of trunk: Secondary | ICD-10-CM

## 2020-12-29 DIAGNOSIS — I2510 Atherosclerotic heart disease of native coronary artery without angina pectoris: Secondary | ICD-10-CM | POA: Diagnosis not present

## 2020-12-29 DIAGNOSIS — Z85850 Personal history of malignant neoplasm of thyroid: Secondary | ICD-10-CM | POA: Diagnosis not present

## 2020-12-29 DIAGNOSIS — I10 Essential (primary) hypertension: Secondary | ICD-10-CM | POA: Diagnosis not present

## 2020-12-29 DIAGNOSIS — E890 Postprocedural hypothyroidism: Secondary | ICD-10-CM

## 2020-12-29 DIAGNOSIS — Z79899 Other long term (current) drug therapy: Secondary | ICD-10-CM | POA: Diagnosis not present

## 2020-12-29 DIAGNOSIS — E7800 Pure hypercholesterolemia, unspecified: Secondary | ICD-10-CM

## 2020-12-29 DIAGNOSIS — Z01818 Encounter for other preprocedural examination: Secondary | ICD-10-CM | POA: Diagnosis not present

## 2020-12-29 MED ORDER — LORAZEPAM 1 MG PO TABS: 0.5000 mg | ORAL_TABLET | Freq: Every day | ORAL | 1 refills | Status: DC | PRN

## 2020-12-29 MED ORDER — ROSUVASTATIN CALCIUM 40 MG PO TABS: ORAL_TABLET | ORAL | 3 refills | Status: DC

## 2020-12-29 MED ORDER — LOSARTAN POTASSIUM 100 MG PO TABS: 100.0000 mg | ORAL_TABLET | Freq: Every day | ORAL | 3 refills | Status: DC

## 2020-12-29 MED ORDER — METOPROLOL TARTRATE 50 MG PO TABS: ORAL_TABLET | ORAL | 3 refills | Status: DC

## 2020-12-29 MED ORDER — BUSPIRONE HCL 30 MG PO TABS: 30.0000 mg | ORAL_TABLET | Freq: Two times a day (BID) | ORAL | 3 refills | Status: DC

## 2020-12-29 MED ORDER — AMLODIPINE BESYLATE 5 MG PO TABS: ORAL_TABLET | ORAL | 3 refills | Status: DC

## 2020-12-29 MED ORDER — FENOFIBRATE 160 MG PO TABS: 160.0000 mg | ORAL_TABLET | Freq: Every day | ORAL | 3 refills | Status: DC

## 2020-12-29 NOTE — Progress Notes (Signed)
Subjective: CC:GAD PCP: Madison Norlander, DO WUG:[id number] [name] is a 75 y.o. female presenting to clinic today for:  1. GAD RARE use of Ativan.  Last refill 11/2019.  She used it about 10 days ago because she was having increased anxiety secondary to her husband's panic attacks.  She denies any excessive daytime sedation, falls or respiratory depression  2. CAD/ HTN Sees cardiology.  Compliant with her medications.  She took them about 2 hours ago.  Blood pressures at home run about 120s to 130s over 70s.  No chest pain, shortness of breath.  She is had some intermittent dizziness but notes that this occurs when she has changes in season is relieved by meclizine.  3. Hypothyroidism, post operative H/o Thyroid cancer.  Sees endocrinology.  She denies any change in bowel habits.  No palpitations.  Energy is fair but certainly no worse prior to change in medication.  She is currently taking 1/2 tablet on Sundays with 1 tablet daily all other days  4.  Lump Patient reports that there is a lump on her low back.  This is nontender.  No draining.  ROS: Per HPI  Allergies  Allergen Reactions   Banana Itching and Swelling    Throat and ears itch,ears feel like they swell   Bupropion Hcl     REACTION: paranoia   Chantix [Varenicline]     paranoia   Latex Rash   Zetia [Ezetimibe]     Myalgia   Past Medical History:  Diagnosis Date   Anxiety    CAD (coronary artery disease)    PCI of an occluded right coronary artery 2001. 70% LAD stenosis, 30% circumflex stenosis.   Diverticulosis    Hyperlipidemia    Hypertension    IBS (irritable bowel syndrome)    Leukocytosis, unspecified 07/24/2012   Myocardial infarction [facility]) 2001   Need for prophylactic hormone replacement therapy (postmenopausal)    Osteopenia    Osteoporosis    PONV (postoperative nausea and vomiting)    Thyroid cancer ([place]) 2004   Dr. Clovia Cuff    Thyroid nodule 2004   Tobacco abuse     Current Outpatient  Medications:    amLODipine (NORVASC) 5 MG tablet, TAKE ONE (1) TABLET EACH DAY, Disp: 90 tablet, Rfl: 3   aspirin EC 81 MG tablet, Take 1 tablet (81 mg total) by mouth daily. Swallow whole., Disp: 30 tablet, Rfl: 11   busPIRone (BUSPAR) 30 MG tablet, Take 1 tablet (30 mg total) by mouth 2 (two) times daily. (Patient taking differently: Take 15 mg by mouth 2 (two) times daily.), Disp: 180 tablet, Rfl: 3   conjugated estrogens (PREMARIN) vaginal cream, Place 1 Applicatorful vaginally 3 (three) times a week. (Patient taking differently: Place 1 Applicatorful vaginally 3 (three) times a week. .625 mg), Disp: 30 g, Rfl: prn   fenofibrate 160 MG tablet, Take 1 tablet (160 mg total) by mouth daily., Disp: 90 tablet, Rfl: 3   gabapentin (NEURONTIN) 300 MG capsule, Take 300 mg by mouth 3 (three) times daily., Disp: , Rfl:    levothyroxine (SYNTHROID) 125 MCG tablet, Take 125 mcg by mouth See admin instructions. Take 125 mg every day EXCEPT: Takes 62.5 mg  on sundays, Disp: , Rfl:    LORazepam (ATIVAN) 1 MG tablet, Take 0.5-1 tablets (0.5-1 mg total) by mouth daily as needed for anxiety., Disp: 30 tablet, Rfl: 1   losartan (COZAAR) 100 MG tablet, Take 1 tablet (100 mg total) by mouth daily., Disp: 90  tablet, Rfl: 3   Melatonin 5 MG TABS, Take 5 mg by mouth at bedtime as needed (sleep)., Disp: , Rfl:    metoprolol tartrate (LOPRESSOR) 50 MG tablet, TAKE ONE (1) TABLET EACH DAY (Patient taking differently: Take 25 mg by mouth 2 (two) times daily.), Disp: 90 tablet, Rfl: 3   nitroGLYCERIN (NITROSTAT) 0.4 MG SL tablet, Place 1 tablet (0.4 mg total) under the tongue every 5 (five) minutes as needed for chest pain., Disp: 25 tablet, Rfl: 3   rosuvastatin (CRESTOR) 40 MG tablet, TAKE ONE (1) TABLET EACH DAY (Patient taking differently: Take 40 mg by mouth daily.), Disp: 90 tablet, Rfl: 3   Vitamin D, Ergocalciferol, (DRISDOL) 1.25 MG (50000 UNIT) CAPS capsule, Take 1 capsule (50,000 Units total) by mouth every 7  (seven) days. (Patient taking differently: Take 50,000 Units by mouth every 7 (seven) days. Sunday), Disp: 12 capsule, Rfl: 3 Social History   Socioeconomic History   Marital status: Married    Spouse name: Not on file   Number of children: 2   Years of education: Not on file   Highest education level: Not on file  Occupational History   Occupation: Retired     Comment: Engineering geologist  Tobacco Use   Smoking status: Every Day    Packs/day: 1.00    Years: 40.00    Pack years: 40.00    Types: Cigarettes   Smokeless tobacco: Never  Vaping Use   Vaping Use: Never used  Substance and Sexual Activity   Alcohol use: No   Drug use: No   Sexual activity: Not Currently  Other Topics Concern   Not on file  Social History Narrative   Dr.  Cyril Loosen -(surgeon) [phone]   Dr. Marilynne Halsted- Endocrinologist  [facility]    Social Determinants of Health   Financial Resource Strain: Not on file  Food Insecurity: Not on file  Transportation Needs: Not on file  Physical Activity: Not on file  Stress: Not on file  Social Connections: Not on file  Intimate Partner Violence: Not on file   Family History  Problem Relation Age of Onset   Coronary artery disease Father    Heart disease Father    Cirrhosis Mother    Alcohol abuse Mother    Cancer Other        family history    Hypertension Maternal Grandmother     Objective: Office vital signs reviewed. BP (!) 154/81   Pulse 71   Temp (!) 97.1 F (36.2 C)   Ht 5' 5"  (1.651 m)   Wt 163 lb 12.8 oz (74.3 kg)   SpO2 92%   BMI 27.26 kg/m   Physical Examination:  General: Awake, alert, well nourished, No acute distress HEENT: Normal, sclera white, MMM Cardio: regular rate and rhythm, S1S2 heard, no murmurs appreciated Pulm: clear to auscultation bilaterally, no wheezes, rhonchi or rales; normal work of breathing on room air Skin: dry; intact; no rashes or lesions; normal temp; she has a palpable cyst that is mobile and nontender along the  left low back Neuro: no tremor  Orthostatic VS for the past 72 hrs (Last 3 readings):  Orthostatic BP Orthostatic Pulse  12/29/20 0939 165/90 71  12/29/20 0938 161/84 68  12/29/20 0937 170/83 65    Assessment/ Plan: 75 y.o. female   GAD (generalized anxiety disorder) - Plan: Drug Screen 10 W/Conf, Se, busPIRone (BUSPAR) 30 MG tablet, LORazepam (ATIVAN) 1 MG tablet  Controlled substance agreement signed - Plan: Drug Screen 10  W/Conf, Se  Coronary artery disease involving native coronary artery of native heart without angina pectoris - Plan: CMP14+EGFR  Essential hypertension - Plan: amLODipine (NORVASC) 5 MG tablet, losartan (COZAAR) 100 MG tablet, metoprolol tartrate (LOPRESSOR) 50 MG tablet  Postoperative hypothyroidism - Plan: TSH, T4, Free  History of thyroid cancer  Pure hypercholesterolemia - Plan: fenofibrate 160 MG tablet, rosuvastatin (CRESTOR) 40 MG tablet  Dermoid cyst of skin of back   Anxiety disorder is stable.  Continue sparing use of Ativan.  The national narcotic database was reviewed and there were no red flags.  CSC and UDS were updated as per office policy.  BuSpar renewed as well  Continue to follow-up with cardiology as scheduled.  Check CMP  Blood pressure was not at goal.  We will plan to recheck her soon.  No changes were made given borderline blood pressure today  We will recheck thyroid labs given recent adjustment in thyroid medication and CCed endocrinology  Not yet due for fasting lipid.  Continue Crestor and fenofibrate.  These have been renewed  Lesion on back was consistent with a cyst.  No orders of the defined types were placed in this encounter.  No orders of the defined types were placed in this encounter.    Madison Norlander, DO [place] [phone]

## 2020-12-29 NOTE — Patient Instructions (Addendum)
You had labs performed today.  You will be contacted with the results of the labs once they are available, usually in the next 3 business days for routine lab work.  If you have an active my chart account, they will be released to your MyChart.  If you prefer to have these labs released to you via telephone, please let us know.  You have the old shingles vaccine called Zostavax previously.  The new shingles vaccine is called Shingrix and is a series of 2 vaccinations.  Given the recurrent history of shingles and the fact that it is affected your face before I think it is worth trying to get the new shingles vaccination.  The reaction you had previously sounds like a localized site reaction which can occur with some vaccines.

## 2020-12-29 NOTE — Progress Notes (Addendum)
Anesthesia Chart Review:  Case: [id number] Date/Time: 01/08/21 1135   Procedure: PLIF,IP,POSTERIOR INSTRUMENTATION, L4-5 - 3C   Anesthesia type: General   Pre-op diagnosis: RECURRENT HERNIATION OF LUMBAR [place]   Location: [place] OR ROOM 43 / [facility]   Surgeons: Newman Pies, MD       DISCUSSION: Patient is a 75 year old female scheduled for the above procedure. She is s/p left L4-5 discectomy 03/20/20.   History includes smoking, post-operative N/V, CAD (s/p late presentation inferior MI, s/p stenting of occluded RCA 03/28/00), HTN, HLD, thyroid cancer (s/p thyroidectomy 02/2003 and post-surgical RAI; post-surgical hypothyroidism), IBS, back surgery (right L5-S1 diskectomy 06/30/00; left L4-5 discectomy 03/20/20), uterovaginal prolapse (s/p [place]).   Last cardiology visit with Dr. Percival Spanish was on 04/23/20. No chest symptoms or new SOB. Still smoking. Known chronic RBBB. No further cardiac testing recommended at that time with one year follow-up planned. Non-ischemic stress test with normal EF in 02/2017. She denied chest pain and SOB at PAT RN visit.    PAT BP documented as 194/98. She had PCP visit with Dr. Lajuana Ripple on 12/29/20 with BP 154/81. She notes that patient said home BP readings ~ 120-130's/70's. She noted patient had a left lower back palpable cystic lesion that was non-tender, non-draining. She ordered routine labs which are still pending.   Attempted to call patient to follow-up on BP and inquire if any changes in CV status since her visit with Dr. Percival Spanish in January. I only got a voice message. By 12/29/20 primary care notes, she was still asymptomatic. Will leave chart for follow-up with patient and additional lab results.   ADDENDUM 12/30/20 4:52 PM: This morning reviewed available information with anesthesiologist Suella Broad, MD. Agree with contacting patient to review BP control and ensure no new changes in CV status since her last visit with Dr. Percival Spanish. I was able to reach patient  this afternoon. She denied chest pain, SOB at rest, edema, syncope, palpitations. She has chronic DOE with prolonged walking which she feels is stable. She has never required her home Nitro. After her 03/2020 back surgery her back pain improved and she was back to being active, confirming previous activities such as walking behind her push lawnmower and raking leaves, vacuuming. Unfortunately back issues severe again since around Spring 2022 which has limited her activity. She cannot do prolonged walking or vacuuming.  She is up to date with cardiology follow-up. Until a few months ago, she was active. She continues to deny CV symptoms. She reported compliance with her BP medications (amlodipine 5 mg daily, losartan 100 mg daily, metoprolol tartrate 50 mg 1/2 po BID). She says she has [place] Hypertension and has fairly normal home BP readings (as above ~ 120-130's/70's). CMET and TSH results from 12/29/20 are also acceptable for OR. Vital on arrival. Anesthesia team to evaluate on the day of surgery. Presurgical COVID-19 test is scheduled for 01/05/21.   VS: BP (!) 193/98   Pulse 73   Temp 36.8 C (Oral)   Resp 18   Ht '5\' 5"'$  (1.651 m)   Wt 75.3 kg   SpO2 93%   BMI 27.62 kg/m  BP Readings from Last 3 Encounters:  12/29/20 (!) 154/81  12/26/20 (!) 193/98  06/27/20 134/77     PROVIDERS: [name], DO is PCP Minus Breeding, MD is cardiologist Judieth Keens, MD is endocrinologist ([place]). Last visit 05/13/20 with one year follow-up planned. Urologist is with [facility]. Last visit 11/03/20 with Rhona Raider, NP.  LABS: Labs reviewed: Acceptable for surgery. (all labs ordered are listed, but only abnormal results are displayed)  Labs Reviewed  BASIC METABOLIC PANEL - Abnormal; Notable for the following components:      Result Value   Glucose, Bld 106 (*)    All other components within normal limits  CBC - Abnormal; Notable for the following components:    WBC 12.0 (*)    RBC 5.36 (*)    Hemoglobin 16.5 (*)    HCT 51.1 (*)    All other components within normal limits  SURGICAL PCR SCREEN  TYPE AND SCREEN    EKG: 04/23/20 ([facility]): NSR, RBBB.    CV: Nuclear stress test 03/15/17: The left ventricular ejection fraction is normal (55-65%). Nuclear stress EF: 64%. No T wave inversion was noted during stress. There was no ST segment deviation noted during stress. This is a low risk study. No reversible ischemia. LVEF 64% with normal wall motion. Mild RV uptake noted, suggestive of elevated pulmonary pressure. This is a low risk study.    Echo 02/25/17: Study Conclusions  - Left ventricle: The cavity size was normal. Wall thickness was    increased in a pattern of mild LVH. Systolic function was normal.    The estimated ejection fraction was in the range of 55% to 60%.    Wall motion was normal; there were no regional wall motion    abnormalities. Doppler parameters are consistent with abnormal    left ventricular relaxation (grade 1 diastolic dysfunction). The    E/e&' ratio is between 8-15, suggesting indeterminate LV filling    pressure.  - Mitral valve: Calcified annulus. Mildly thickened leaflets .    There was trivial regurgitation.  - Left atrium: The atrium was normal in size.  - Inferior vena cava: The vessel was normal in size. The    respirophasic diameter changes were in the normal range (>= 50%),    consistent with normal central venous pressure.  Impressions:  - LVEF 55-60%, mild LVH, normal wall motion, grade 1 DD,    indeterminate LV filling pressure, trivial MR, normal LA size,    normal IVC.    Cardiac cath 03/28/00 (onset CP 20:30 03/27/00, arrived to ED 9:53 03/28/00): LM: Free of significant disease. LAD: Gave rise to a large septal perforator and a moderate DIAG branch. 70% narrowing in the LAD just after the septal perforator and 50% narrowing in the mid LAD. CX: Gave rise to an intermediate branch,  a marginal branch, and a posterolateral branch. 30% mid vessel narrowing. RCA: S/p RCA Complete occluded proximally. There were collaterals from the left coronary system.   PCI: Following stenting of the proximal right coronary artery occlusion, the stenosis improved from 100% to 0% and the flow improved from TIMI-0 to TIMI-III flow. LV: Hyperkinesis of the inferior wall. Apex and anterolateral wall move well. Estimated EF 55%.    Past Medical History:  Diagnosis Date   Anxiety    CAD (coronary artery disease)    PCI of an occluded right coronary artery 2001. 70% LAD stenosis, 30% circumflex stenosis.   Diverticulosis    Hyperlipidemia    Hypertension    IBS (irritable bowel syndrome)    Leukocytosis, unspecified 07/24/2012   Myocardial infarction [facility]) 2001   Need for prophylactic hormone replacement therapy (postmenopausal)    Osteopenia    Osteoporosis    PONV (postoperative nausea and vomiting)    Thyroid cancer ([place]) 2004   Dr. Clovia Cuff    Thyroid  nodule 2004   Tobacco abuse     Past Surgical History:  Procedure Laterality Date   APPENDECTOMY  1955   BACK SURGERY  06/2000   CARDIAC CATHETERIZATION  2001   CHOLECYSTECTOMY  07/2002   EYE SURGERY Bilateral 2019   cataract   LIPOMA EXCISION Right 08/2003   r arm    LUMBAR LAMINECTOMY/DECOMPRESSION MICRODISCECTOMY Left 03/20/2020   Procedure: LEFT LUMBAR FOUR-FIVE MICRODISCECTOMY;  Surgeon: Newman Pies, MD;  Location: [place];  Service: Neurosurgery;  Laterality: Left;   peserie     THYROIDECTOMY Bilateral 02/2003    MEDICATIONS:  amLODipine (NORVASC) 5 MG tablet   aspirin EC 81 MG tablet   busPIRone (BUSPAR) 30 MG tablet   conjugated estrogens (PREMARIN) vaginal cream   fenofibrate 160 MG tablet   gabapentin (NEURONTIN) 300 MG capsule   levothyroxine (SYNTHROID) 125 MCG tablet   LORazepam (ATIVAN) 1 MG tablet   losartan (COZAAR) 100 MG tablet   Melatonin 5 MG TABS   metoprolol tartrate (LOPRESSOR) 50 MG tablet    nitroGLYCERIN (NITROSTAT) 0.4 MG SL tablet   rosuvastatin (CRESTOR) 40 MG tablet   Vitamin D, Ergocalciferol, (DRISDOL) 1.25 MG (50000 UNIT) CAPS capsule   No current facility-administered medications for this encounter.  Per patient, last ASA for 01/02/21.  Myra Gianotti, PA-C Surgical Short Stay/Anesthesiology [facility] Phone [phone] [facility] Phone [phone] 12/29/2020 4:38 PM

## 2021-01-05 ENCOUNTER — Other Ambulatory Visit (HOSPITAL_COMMUNITY)
Admission: RE | Admit: 2021-01-05 | Discharge: 2021-01-05 | Disposition: A | Discharge status: Home / Self Care | Payer: Medicare Other | Source: Ambulatory Visit | Attending: Neurosurgery | Admitting: Neurosurgery

## 2021-01-05 DIAGNOSIS — I10 Essential (primary) hypertension: Secondary | ICD-10-CM | POA: Diagnosis not present

## 2021-01-05 DIAGNOSIS — M5116 Intervertebral disc disorders with radiculopathy, lumbar region: Secondary | ICD-10-CM | POA: Diagnosis not present

## 2021-01-05 DIAGNOSIS — K589 Irritable bowel syndrome without diarrhea: Secondary | ICD-10-CM | POA: Diagnosis not present

## 2021-01-05 DIAGNOSIS — Z85850 Personal history of malignant neoplasm of thyroid: Secondary | ICD-10-CM | POA: Diagnosis not present

## 2021-01-05 DIAGNOSIS — Z20822 Contact with and (suspected) exposure to covid-19: Secondary | ICD-10-CM | POA: Insufficient documentation

## 2021-01-05 DIAGNOSIS — Z888 Allergy status to other drugs, medicaments and biological substances status: Secondary | ICD-10-CM | POA: Diagnosis not present

## 2021-01-05 DIAGNOSIS — E785 Hyperlipidemia, unspecified: Secondary | ICD-10-CM | POA: Diagnosis not present

## 2021-01-05 DIAGNOSIS — Z9049 Acquired absence of other specified parts of digestive tract: Secondary | ICD-10-CM | POA: Diagnosis not present

## 2021-01-05 DIAGNOSIS — M4326 Fusion of spine, lumbar region: Secondary | ICD-10-CM | POA: Diagnosis not present

## 2021-01-05 DIAGNOSIS — Z01812 Encounter for preprocedural laboratory examination: Secondary | ICD-10-CM | POA: Insufficient documentation

## 2021-01-05 DIAGNOSIS — K5790 Diverticulosis of intestine, part unspecified, without perforation or abscess without bleeding: Secondary | ICD-10-CM | POA: Diagnosis not present

## 2021-01-05 DIAGNOSIS — F419 Anxiety disorder, unspecified: Secondary | ICD-10-CM | POA: Diagnosis present

## 2021-01-05 DIAGNOSIS — Z91018 Allergy to other foods: Secondary | ICD-10-CM | POA: Diagnosis not present

## 2021-01-05 DIAGNOSIS — Z91040 Latex allergy status: Secondary | ICD-10-CM | POA: Diagnosis not present

## 2021-01-05 DIAGNOSIS — M810 Age-related osteoporosis without current pathological fracture: Secondary | ICD-10-CM | POA: Diagnosis not present

## 2021-01-05 DIAGNOSIS — I252 Old myocardial infarction: Secondary | ICD-10-CM | POA: Diagnosis not present

## 2021-01-05 DIAGNOSIS — Z8249 Family history of ischemic heart disease and other diseases of the circulatory system: Secondary | ICD-10-CM | POA: Diagnosis not present

## 2021-01-05 DIAGNOSIS — F17210 Nicotine dependence, cigarettes, uncomplicated: Secondary | ICD-10-CM | POA: Diagnosis not present

## 2021-01-05 DIAGNOSIS — Z811 Family history of alcohol abuse and dependence: Secondary | ICD-10-CM | POA: Diagnosis not present

## 2021-01-05 DIAGNOSIS — Z981 Arthrodesis status: Secondary | ICD-10-CM | POA: Diagnosis not present

## 2021-01-05 DIAGNOSIS — I2510 Atherosclerotic heart disease of native coronary artery without angina pectoris: Secondary | ICD-10-CM | POA: Diagnosis not present

## 2021-01-05 LAB — SARS CORONAVIRUS 2 (TAT 6-24 HRS): SARS Coronavirus 2: NEGATIVE

## 2021-01-07 LAB — AMPHETAMINES/MDMA,MS,WB/SP RFX
Amphetamine: NEGATIVE ng/mL
Amphetamines Confirmation: NEGATIVE
MDA: NEGATIVE ng/mL
MDEA: NEGATIVE ng/mL
MDMA: NEGATIVE ng/mL
Methamphetamine: NEGATIVE ng/mL

## 2021-01-07 LAB — DRUG SCREEN 10 W/CONF, SERUM
Amphetamines, IA: NEGATIVE ng/mL
Barbiturates, IA: NEGATIVE ug/mL
Benzodiazepines, IA: NEGATIVE ng/mL
Cocaine & Metabolite, IA: NEGATIVE ng/mL
Methadone, IA: NEGATIVE ng/mL
Opiates, IA: NEGATIVE ng/mL
Oxycodones, IA: NEGATIVE ng/mL
Phencyclidine, IA: NEGATIVE ng/mL
Propoxyphene, IA: NEGATIVE ng/mL
THC(Marijuana) Metabolite, IA: NEGATIVE ng/mL

## 2021-01-07 LAB — CMP14+EGFR
ALT: 13 IU/L (ref 0–32)
AST: 14 IU/L (ref 0–40)
Albumin/Globulin Ratio: 1.9 (ref 1.2–2.2)
Albumin: 4.5 g/dL (ref 3.7–4.7)
Alkaline Phosphatase: 68 IU/L (ref 44–121)
BUN/Creatinine Ratio: 16 (ref 12–28)
BUN: 12 mg/dL (ref 8–27)
Bilirubin Total: 0.4 mg/dL (ref 0.0–1.2)
CO2: 18 mmol/L — ABNORMAL LOW (ref 20–29)
Calcium: 9.5 mg/dL (ref 8.7–10.3)
Chloride: 104 mmol/L (ref 96–106)
Creatinine, Ser: 0.73 mg/dL (ref 0.57–1.00)
Globulin, Total: 2.4 g/dL (ref 1.5–4.5)
Glucose: 104 mg/dL — ABNORMAL HIGH (ref 65–99)
Potassium: 4 mmol/L (ref 3.5–5.2)
Sodium: 139 mmol/L (ref 134–144)
Total Protein: 6.9 g/dL (ref 6.0–8.5)
eGFR: 86 mL/min/{1.73_m2} (ref >59)

## 2021-01-07 LAB — T4, FREE: Free T4: 1.71 ng/dL (ref 0.82–1.77)

## 2021-01-07 LAB — TSH: TSH: 1.07 u[IU]/mL (ref 0.450–4.500)

## 2021-01-08 ENCOUNTER — Other Ambulatory Visit: Payer: Self-pay

## 2021-01-08 ENCOUNTER — Inpatient Hospital Stay (HOSPITAL_COMMUNITY): Payer: Medicare Other

## 2021-01-08 ENCOUNTER — Inpatient Hospital Stay (HOSPITAL_COMMUNITY): Payer: Medicare Other | Admitting: Vascular Surgery

## 2021-01-08 ENCOUNTER — Encounter (HOSPITAL_COMMUNITY)
Admission: RE | Disposition: A | Discharge status: Home / Self Care | Payer: Self-pay | Source: Home / Self Care | Attending: Neurosurgery

## 2021-01-08 ENCOUNTER — Inpatient Hospital Stay (HOSPITAL_COMMUNITY)
Admission: RE | Admit: 2021-01-08 | Discharge: 2021-01-09 | DRG: 455 | Disposition: A | Discharge status: Home / Self Care | Payer: Medicare Other | Attending: Neurosurgery | Admitting: Neurosurgery

## 2021-01-08 ENCOUNTER — Encounter (HOSPITAL_COMMUNITY): Payer: Self-pay | Admitting: Neurosurgery

## 2021-01-08 DIAGNOSIS — F419 Anxiety disorder, unspecified: Secondary | ICD-10-CM | POA: Diagnosis present

## 2021-01-08 DIAGNOSIS — Z85850 Personal history of malignant neoplasm of thyroid: Secondary | ICD-10-CM | POA: Diagnosis not present

## 2021-01-08 DIAGNOSIS — I2510 Atherosclerotic heart disease of native coronary artery without angina pectoris: Secondary | ICD-10-CM | POA: Diagnosis present

## 2021-01-08 DIAGNOSIS — I252 Old myocardial infarction: Secondary | ICD-10-CM | POA: Diagnosis not present

## 2021-01-08 DIAGNOSIS — Z20822 Contact with and (suspected) exposure to covid-19: Secondary | ICD-10-CM | POA: Diagnosis present

## 2021-01-08 DIAGNOSIS — Z91040 Latex allergy status: Secondary | ICD-10-CM

## 2021-01-08 DIAGNOSIS — M5116 Intervertebral disc disorders with radiculopathy, lumbar region: Principal | ICD-10-CM | POA: Diagnosis present

## 2021-01-08 DIAGNOSIS — Z888 Allergy status to other drugs, medicaments and biological substances status: Secondary | ICD-10-CM | POA: Diagnosis not present

## 2021-01-08 DIAGNOSIS — K5790 Diverticulosis of intestine, part unspecified, without perforation or abscess without bleeding: Secondary | ICD-10-CM | POA: Diagnosis present

## 2021-01-08 DIAGNOSIS — Z811 Family history of alcohol abuse and dependence: Secondary | ICD-10-CM | POA: Diagnosis not present

## 2021-01-08 DIAGNOSIS — F17210 Nicotine dependence, cigarettes, uncomplicated: Secondary | ICD-10-CM | POA: Diagnosis present

## 2021-01-08 DIAGNOSIS — M810 Age-related osteoporosis without current pathological fracture: Secondary | ICD-10-CM | POA: Diagnosis present

## 2021-01-08 DIAGNOSIS — K589 Irritable bowel syndrome without diarrhea: Secondary | ICD-10-CM | POA: Diagnosis present

## 2021-01-08 DIAGNOSIS — Z8249 Family history of ischemic heart disease and other diseases of the circulatory system: Secondary | ICD-10-CM | POA: Diagnosis not present

## 2021-01-08 DIAGNOSIS — M4326 Fusion of spine, lumbar region: Secondary | ICD-10-CM | POA: Diagnosis not present

## 2021-01-08 DIAGNOSIS — Z981 Arthrodesis status: Secondary | ICD-10-CM | POA: Diagnosis not present

## 2021-01-08 DIAGNOSIS — I10 Essential (primary) hypertension: Secondary | ICD-10-CM | POA: Diagnosis present

## 2021-01-08 DIAGNOSIS — Z419 Encounter for procedure for purposes other than remedying health state, unspecified: Secondary | ICD-10-CM

## 2021-01-08 DIAGNOSIS — Z9049 Acquired absence of other specified parts of digestive tract: Secondary | ICD-10-CM | POA: Diagnosis not present

## 2021-01-08 DIAGNOSIS — Z91018 Allergy to other foods: Secondary | ICD-10-CM | POA: Diagnosis not present

## 2021-01-08 DIAGNOSIS — E785 Hyperlipidemia, unspecified: Secondary | ICD-10-CM | POA: Diagnosis not present

## 2021-01-08 DIAGNOSIS — M5126 Other intervertebral disc displacement, lumbar region: Secondary | ICD-10-CM | POA: Diagnosis present

## 2021-01-08 LAB — ABO/RH: ABO/RH(D): B POS

## 2021-01-08 SURGERY — POSTERIOR LUMBAR FUSION 1 LEVEL
Anesthesia: General | Site: Spine Lumbar

## 2021-01-08 MED ORDER — MORPHINE SULFATE (PF) 4 MG/ML IV SOLN
4.0000 mg | INTRAVENOUS | Status: DC | PRN
Start: 2021-01-08 — End: 2021-01-09
  Administered 2021-01-08: 4 mg via INTRAVENOUS
  Filled 2021-01-08: qty 1

## 2021-01-08 MED ORDER — THROMBIN 5000 UNITS EX SOLR
OROMUCOSAL | Status: DC | PRN
  Administered 2021-01-08 (×2): 5 mL via TOPICAL

## 2021-01-08 MED ORDER — ACETAMINOPHEN 10 MG/ML IV SOLN: 1000.0000 mg | Freq: Once | INTRAVENOUS | Status: DC | PRN

## 2021-01-08 MED ORDER — ONDANSETRON HCL 4 MG/2ML IJ SOLN: 4.0000 mg | Freq: Four times a day (QID) | INTRAMUSCULAR | Status: DC | PRN

## 2021-01-08 MED ORDER — LIDOCAINE 2% (20 MG/ML) 5 ML SYRINGE
INTRAMUSCULAR | Status: DC | PRN
  Administered 2021-01-08: 60 mg via INTRAVENOUS

## 2021-01-08 MED ORDER — ROCURONIUM BROMIDE 10 MG/ML (PF) SYRINGE
PREFILLED_SYRINGE | INTRAVENOUS | Status: DC | PRN
  Administered 2021-01-08: 60 mg via INTRAVENOUS
  Administered 2021-01-08 (×2): 20 mg via INTRAVENOUS

## 2021-01-08 MED ORDER — PHENOL 1.4 % MT LIQD: 1.0000 | OROMUCOSAL | Status: DC | PRN

## 2021-01-08 MED ORDER — GABAPENTIN 300 MG PO CAPS
300.0000 mg | ORAL_CAPSULE | Freq: Three times a day (TID) | ORAL | Status: DC
  Administered 2021-01-08 (×2): 300 mg via ORAL
  Filled 2021-01-08 (×2): qty 1

## 2021-01-08 MED ORDER — ONDANSETRON HCL 4 MG PO TABS: 4.0000 mg | ORAL_TABLET | Freq: Four times a day (QID) | ORAL | Status: DC | PRN

## 2021-01-08 MED ORDER — LACTATED RINGERS IV SOLN: INTRAVENOUS | Status: DC

## 2021-01-08 MED ORDER — SODIUM CHLORIDE 0.9% FLUSH: 3.0000 mL | INTRAVENOUS | Status: DC | PRN

## 2021-01-08 MED ORDER — LOSARTAN POTASSIUM 50 MG PO TABS
100.0000 mg | ORAL_TABLET | Freq: Every day | ORAL | Status: DC
  Administered 2021-01-08: 100 mg via ORAL
  Filled 2021-01-08: qty 2

## 2021-01-08 MED ORDER — DEXAMETHASONE SODIUM PHOSPHATE 10 MG/ML IJ SOLN
INTRAMUSCULAR | Status: DC | PRN
Start: 2021-01-08 — End: 2021-01-08
  Administered 2021-01-08: 5 mg via INTRAVENOUS

## 2021-01-08 MED ORDER — 0.9 % SODIUM CHLORIDE (POUR BTL) OPTIME
TOPICAL | Status: DC | PRN
  Administered 2021-01-08: 1000 mL

## 2021-01-08 MED ORDER — FENOFIBRATE 160 MG PO TABS
160.0000 mg | ORAL_TABLET | Freq: Every day | ORAL | Status: DC
  Administered 2021-01-08: 160 mg via ORAL
  Filled 2021-01-08 (×2): qty 1

## 2021-01-08 MED ORDER — ONDANSETRON HCL 4 MG/2ML IJ SOLN
INTRAMUSCULAR | Status: AC
  Filled 2021-01-08: qty 2

## 2021-01-08 MED ORDER — ONDANSETRON HCL 4 MG/2ML IJ SOLN
INTRAMUSCULAR | Status: DC | PRN
  Administered 2021-01-08: 4 mg via INTRAVENOUS

## 2021-01-08 MED ORDER — ROSUVASTATIN CALCIUM 20 MG PO TABS
20.0000 mg | ORAL_TABLET | Freq: Every day | ORAL | Status: DC
  Administered 2021-01-08: 20 mg via ORAL
  Filled 2021-01-08: qty 1

## 2021-01-08 MED ORDER — PROPOFOL 10 MG/ML IV BOLUS
INTRAVENOUS | Status: AC
  Filled 2021-01-08: qty 20

## 2021-01-08 MED ORDER — BISACODYL 10 MG RE SUPP: 10.0000 mg | Freq: Every day | RECTAL | Status: DC | PRN

## 2021-01-08 MED ORDER — CYCLOBENZAPRINE HCL 10 MG PO TABS
10.0000 mg | ORAL_TABLET | Freq: Three times a day (TID) | ORAL | Status: DC | PRN
  Administered 2021-01-08: 10 mg via ORAL
  Filled 2021-01-08: qty 1

## 2021-01-08 MED ORDER — PHENYLEPHRINE HCL-NACL 20-0.9 MG/250ML-% IV SOLN
INTRAVENOUS | Status: DC | PRN
  Administered 2021-01-08: 40 ug/min via INTRAVENOUS

## 2021-01-08 MED ORDER — FENTANYL CITRATE (PF) 250 MCG/5ML IJ SOLN
INTRAMUSCULAR | Status: DC | PRN
  Administered 2021-01-08 (×2): 50 ug via INTRAVENOUS
  Administered 2021-01-08: 100 ug via INTRAVENOUS
  Administered 2021-01-08: 50 ug via INTRAVENOUS

## 2021-01-08 MED ORDER — OXYCODONE HCL 5 MG PO TABS
5.0000 mg | ORAL_TABLET | ORAL | Status: DC | PRN
  Administered 2021-01-08: 5 mg via ORAL
  Filled 2021-01-08: qty 1

## 2021-01-08 MED ORDER — BUPIVACAINE LIPOSOME 1.3 % IJ SUSP
INTRAMUSCULAR | Status: AC
  Filled 2021-01-08: qty 20

## 2021-01-08 MED ORDER — BACITRACIN ZINC 500 UNIT/GM EX OINT
TOPICAL_OINTMENT | CUTANEOUS | Status: AC
  Filled 2021-01-08: qty 28.35

## 2021-01-08 MED ORDER — DEXMEDETOMIDINE (PRECEDEX) IN NS 20 MCG/5ML (4 MCG/ML) IV SYRINGE
PREFILLED_SYRINGE | INTRAVENOUS | Status: AC
  Filled 2021-01-08: qty 10

## 2021-01-08 MED ORDER — PHENYLEPHRINE 40 MCG/ML (10ML) SYRINGE FOR IV PUSH (FOR BLOOD PRESSURE SUPPORT)
PREFILLED_SYRINGE | INTRAVENOUS | Status: AC
  Filled 2021-01-08: qty 20

## 2021-01-08 MED ORDER — THROMBIN 5000 UNITS EX SOLR
CUTANEOUS | Status: AC
  Filled 2021-01-08: qty 5000

## 2021-01-08 MED ORDER — LEVOTHYROXINE SODIUM 25 MCG PO TABS
125.0000 ug | ORAL_TABLET | ORAL | Status: DC
  Administered 2021-01-09: 125 ug via ORAL
  Filled 2021-01-08: qty 1

## 2021-01-08 MED ORDER — ROCURONIUM BROMIDE 10 MG/ML (PF) SYRINGE
PREFILLED_SYRINGE | INTRAVENOUS | Status: AC
  Filled 2021-01-08: qty 10

## 2021-01-08 MED ORDER — DOCUSATE SODIUM 100 MG PO CAPS
100.0000 mg | ORAL_CAPSULE | Freq: Two times a day (BID) | ORAL | Status: DC
  Administered 2021-01-08: 100 mg via ORAL
  Filled 2021-01-08: qty 1

## 2021-01-08 MED ORDER — ZOLPIDEM TARTRATE 5 MG PO TABS: 5.0000 mg | ORAL_TABLET | Freq: Every evening | ORAL | Status: DC | PRN

## 2021-01-08 MED ORDER — LIDOCAINE HCL (PF) 2 % IJ SOLN
INTRAMUSCULAR | Status: AC
  Filled 2021-01-08: qty 5

## 2021-01-08 MED ORDER — ACETAMINOPHEN 10 MG/ML IV SOLN
INTRAVENOUS | Status: AC
  Filled 2021-01-08: qty 100

## 2021-01-08 MED ORDER — PROPOFOL 10 MG/ML IV BOLUS
INTRAVENOUS | Status: DC | PRN
Start: 2021-01-08 — End: 2021-01-08
  Administered 2021-01-08: 150 mg via INTRAVENOUS

## 2021-01-08 MED ORDER — OXYCODONE HCL 5 MG PO TABS
10.0000 mg | ORAL_TABLET | ORAL | Status: DC | PRN
Start: 2021-01-08 — End: 2021-01-09
  Administered 2021-01-08 – 2021-01-09 (×3): 10 mg via ORAL
  Filled 2021-01-08 (×3): qty 2

## 2021-01-08 MED ORDER — AMISULPRIDE (ANTIEMETIC) 5 MG/2ML IV SOLN: 10.0000 mg | Freq: Once | INTRAVENOUS | Status: DC | PRN

## 2021-01-08 MED ORDER — MIDAZOLAM HCL 2 MG/2ML IJ SOLN
INTRAMUSCULAR | Status: AC
  Filled 2021-01-08: qty 2

## 2021-01-08 MED ORDER — EPHEDRINE 5 MG/ML INJ
INTRAVENOUS | Status: AC
  Filled 2021-01-08: qty 5

## 2021-01-08 MED ORDER — FENTANYL CITRATE (PF) 250 MCG/5ML IJ SOLN
INTRAMUSCULAR | Status: AC
  Filled 2021-01-08: qty 5

## 2021-01-08 MED ORDER — BUSPIRONE HCL 15 MG PO TABS
30.0000 mg | ORAL_TABLET | Freq: Two times a day (BID) | ORAL | Status: DC
  Administered 2021-01-08: 30 mg via ORAL
  Filled 2021-01-08 (×2): qty 2

## 2021-01-08 MED ORDER — BACITRACIN ZINC 500 UNIT/GM EX OINT
TOPICAL_OINTMENT | CUTANEOUS | Status: DC | PRN
  Administered 2021-01-08: 1 via TOPICAL

## 2021-01-08 MED ORDER — BUPIVACAINE-EPINEPHRINE (PF) 0.5% -1:200000 IJ SOLN
INTRAMUSCULAR | Status: DC | PRN
  Administered 2021-01-08: 10 mL

## 2021-01-08 MED ORDER — METOPROLOL TARTRATE 25 MG PO TABS
50.0000 mg | ORAL_TABLET | Freq: Two times a day (BID) | ORAL | Status: DC
  Administered 2021-01-08: 50 mg via ORAL
  Filled 2021-01-08: qty 2

## 2021-01-08 MED ORDER — CHLORHEXIDINE GLUCONATE CLOTH 2 % EX PADS: 6.0000 | MEDICATED_PAD | Freq: Once | CUTANEOUS | Status: DC

## 2021-01-08 MED ORDER — AMLODIPINE BESYLATE 5 MG PO TABS: 5.0000 mg | ORAL_TABLET | Freq: Every day | ORAL | Status: DC

## 2021-01-08 MED ORDER — BUPIVACAINE LIPOSOME 1.3 % IJ SUSP
INTRAMUSCULAR | Status: DC | PRN
  Administered 2021-01-08: 20 mL

## 2021-01-08 MED ORDER — PHENYLEPHRINE 40 MCG/ML (10ML) SYRINGE FOR IV PUSH (FOR BLOOD PRESSURE SUPPORT)
PREFILLED_SYRINGE | INTRAVENOUS | Status: DC | PRN
  Administered 2021-01-08 (×7): 80 ug via INTRAVENOUS

## 2021-01-08 MED ORDER — MIDAZOLAM HCL 2 MG/2ML IJ SOLN
INTRAMUSCULAR | Status: DC | PRN
  Administered 2021-01-08: 1 mg via INTRAVENOUS

## 2021-01-08 MED ORDER — DEXAMETHASONE SODIUM PHOSPHATE 10 MG/ML IJ SOLN
INTRAMUSCULAR | Status: AC
  Filled 2021-01-08: qty 1

## 2021-01-08 MED ORDER — SODIUM CHLORIDE 0.9 % IV SOLN: 250.0000 mL | INTRAVENOUS | Status: DC

## 2021-01-08 MED ORDER — ORAL CARE MOUTH RINSE: 15.0000 mL | Freq: Once | OROMUCOSAL | Status: AC

## 2021-01-08 MED ORDER — ACETAMINOPHEN 650 MG RE SUPP: 650.0000 mg | RECTAL | Status: DC | PRN

## 2021-01-08 MED ORDER — BUPIVACAINE-EPINEPHRINE 0.5% -1:200000 IJ SOLN
INTRAMUSCULAR | Status: AC
  Filled 2021-01-08: qty 1

## 2021-01-08 MED ORDER — CEFAZOLIN SODIUM-DEXTROSE 2-4 GM/100ML-% IV SOLN
2.0000 g | Freq: Three times a day (TID) | INTRAVENOUS | Status: AC
  Administered 2021-01-08 – 2021-01-09 (×2): 2 g via INTRAVENOUS
  Filled 2021-01-08 (×2): qty 100

## 2021-01-08 MED ORDER — NITROGLYCERIN 0.4 MG SL SUBL: 0.4000 mg | SUBLINGUAL_TABLET | SUBLINGUAL | Status: DC | PRN

## 2021-01-08 MED ORDER — SUGAMMADEX SODIUM 200 MG/2ML IV SOLN
INTRAVENOUS | Status: DC | PRN
  Administered 2021-01-08: 200 mg via INTRAVENOUS

## 2021-01-08 MED ORDER — FENTANYL CITRATE (PF) 100 MCG/2ML IJ SOLN
25.0000 ug | INTRAMUSCULAR | Status: DC | PRN
  Administered 2021-01-08 (×3): 25 ug via INTRAVENOUS

## 2021-01-08 MED ORDER — ACETAMINOPHEN 325 MG PO TABS
650.0000 mg | ORAL_TABLET | ORAL | Status: DC | PRN
  Administered 2021-01-08: 650 mg via ORAL
  Filled 2021-01-08: qty 2

## 2021-01-08 MED ORDER — CHLORHEXIDINE GLUCONATE 0.12 % MT SOLN
15.0000 mL | Freq: Once | OROMUCOSAL | Status: AC
  Administered 2021-01-08: 15 mL via OROMUCOSAL
  Filled 2021-01-08: qty 15

## 2021-01-08 MED ORDER — LEVOTHYROXINE SODIUM 75 MCG PO TABS: 62.5000 ug | ORAL_TABLET | ORAL | Status: DC

## 2021-01-08 MED ORDER — MENTHOL 3 MG MT LOZG: 1.0000 | LOZENGE | OROMUCOSAL | Status: DC | PRN

## 2021-01-08 MED ORDER — CEFAZOLIN SODIUM-DEXTROSE 2-4 GM/100ML-% IV SOLN
2.0000 g | INTRAVENOUS | Status: AC
  Administered 2021-01-08: 2 g via INTRAVENOUS
  Filled 2021-01-08: qty 100

## 2021-01-08 MED ORDER — ACETAMINOPHEN 500 MG PO TABS
1000.0000 mg | ORAL_TABLET | Freq: Four times a day (QID) | ORAL | Status: DC
  Administered 2021-01-08 – 2021-01-09 (×2): 1000 mg via ORAL
  Filled 2021-01-08 (×2): qty 2

## 2021-01-08 MED ORDER — ACETAMINOPHEN 10 MG/ML IV SOLN
INTRAVENOUS | Status: DC | PRN
  Administered 2021-01-08: 1000 mg via INTRAVENOUS

## 2021-01-08 MED ORDER — FENTANYL CITRATE (PF) 100 MCG/2ML IJ SOLN
INTRAMUSCULAR | Status: AC
  Filled 2021-01-08: qty 2

## 2021-01-08 MED ORDER — DEXMEDETOMIDINE (PRECEDEX) IN NS 20 MCG/5ML (4 MCG/ML) IV SYRINGE
PREFILLED_SYRINGE | INTRAVENOUS | Status: DC | PRN
  Administered 2021-01-08 (×2): 8 ug via INTRAVENOUS
  Administered 2021-01-08: 4 ug via INTRAVENOUS

## 2021-01-08 MED ORDER — SODIUM CHLORIDE 0.9% FLUSH: 3.0000 mL | Freq: Two times a day (BID) | INTRAVENOUS | Status: DC

## 2021-01-08 SURGICAL SUPPLY — 71 items
APL SKNCLS STERI-STRIP NONHPOA (GAUZE/BANDAGES/DRESSINGS) ×1
BAG COUNTER SPONGE SURGICOUNT (BAG) ×2 IMPLANT
BAG SPNG CNTER NS LX DISP (BAG) ×1
BASKET BONE COLLECTION (BASKET) ×2 IMPLANT
BENZOIN TINCTURE PRP APPL 2/3 (GAUZE/BANDAGES/DRESSINGS) ×2 IMPLANT
BLADE CLIPPER SURG (BLADE) IMPLANT
BUR MATCHSTICK NEURO 3.0 LAGG (BURR) ×2 IMPLANT
BUR PRECISION FLUTE 6.0 (BURR) ×2 IMPLANT
CANISTER SUCT 3000ML PPV (MISCELLANEOUS) ×2 IMPLANT
CAP LOCK DLX THRD (Cap) ×4 IMPLANT
CARTRIDGE OIL MAESTRO DRILL (MISCELLANEOUS) ×1 IMPLANT
CATH FOLEY LATEX FREE 16FR (CATHETERS) ×2
CATH FOLEY LF 16FR (CATHETERS) IMPLANT
CLSR STERI-STRIP ANTIMIC 1/2X4 (GAUZE/BANDAGES/DRESSINGS) ×1 IMPLANT
CNTNR URN SCR LID CUP LEK RST (MISCELLANEOUS) ×1 IMPLANT
CONT SPEC 4OZ STRL OR WHT (MISCELLANEOUS) ×2
COVER BACK TABLE 60X90IN (DRAPES) ×2 IMPLANT
DECANTER SPIKE VIAL GLASS SM (MISCELLANEOUS) ×2 IMPLANT
DIFFUSER DRILL AIR PNEUMATIC (MISCELLANEOUS) ×2 IMPLANT
DRAPE C-ARM 42X72 X-RAY (DRAPES) ×4 IMPLANT
DRAPE HALF SHEET 40X57 (DRAPES) ×2 IMPLANT
DRAPE LAPAROTOMY 100X72X124 (DRAPES) ×2 IMPLANT
DRAPE SURG 17X23 STRL (DRAPES) ×8 IMPLANT
DRSG OPSITE POSTOP 4X6 (GAUZE/BANDAGES/DRESSINGS) ×2 IMPLANT
ELECT BLADE 4.0 EZ CLEAN MEGAD (MISCELLANEOUS) ×2
ELECT REM PT RETURN 9FT ADLT (ELECTROSURGICAL) ×2
ELECTRODE BLDE 4.0 EZ CLN MEGD (MISCELLANEOUS) ×1 IMPLANT
ELECTRODE REM PT RTRN 9FT ADLT (ELECTROSURGICAL) ×1 IMPLANT
EVACUATOR 1/8 PVC DRAIN (DRAIN) IMPLANT
GAUZE 4X4 16PLY ~~LOC~~+RFID DBL (SPONGE) ×2 IMPLANT
GLOVE EXAM NITRILE XL STR (GLOVE) IMPLANT
GLOVE SRG 8 PF TXTR STRL LF DI (GLOVE) IMPLANT
GLOVE SURG ENC MOIS LTX SZ8 (GLOVE) ×2 IMPLANT
GLOVE SURG ENC MOIS LTX SZ8.5 (GLOVE) ×2 IMPLANT
GLOVE SURG POLYISO LF SZ8 (GLOVE) ×2 IMPLANT
GLOVE SURG POLYISO LF SZ8.5 (GLOVE) ×4 IMPLANT
GLOVE SURG UNDER POLY LF SZ8 (GLOVE) ×4
GOWN STRL REUS W/ TWL LRG LVL3 (GOWN DISPOSABLE) IMPLANT
GOWN STRL REUS W/ TWL XL LVL3 (GOWN DISPOSABLE) ×2 IMPLANT
GOWN STRL REUS W/TWL 2XL LVL3 (GOWN DISPOSABLE) ×1 IMPLANT
GOWN STRL REUS W/TWL LRG LVL3 (GOWN DISPOSABLE)
GOWN STRL REUS W/TWL XL LVL3 (GOWN DISPOSABLE) ×6
HEMOSTAT POWDER KIT SURGIFOAM (HEMOSTASIS) ×3 IMPLANT
KIT BASIN OR (CUSTOM PROCEDURE TRAY) ×2 IMPLANT
KIT TURNOVER KIT B (KITS) ×2 IMPLANT
MILL MEDIUM DISP (BLADE) ×2 IMPLANT
NDL HYPO 21X1.5 SAFETY (NEEDLE) IMPLANT
NEEDLE HYPO 21X1.5 SAFETY (NEEDLE) ×2 IMPLANT
NEEDLE HYPO 22GX1.5 SAFETY (NEEDLE) ×2 IMPLANT
NS IRRIG 1000ML POUR BTL (IV SOLUTION) ×2 IMPLANT
OIL CARTRIDGE MAESTRO DRILL (MISCELLANEOUS) ×2
PACK LAMINECTOMY NEURO (CUSTOM PROCEDURE TRAY) ×2 IMPLANT
PAD ARMBOARD 7.5X6 YLW CONV (MISCELLANEOUS) ×6 IMPLANT
PATTIES SURGICAL .5 X1 (DISPOSABLE) IMPLANT
PATTIES SURGICAL 1X1 (DISPOSABLE) ×1 IMPLANT
PUTTY DBM 10CC CALC GRAN (Putty) ×1 IMPLANT
ROD CURVED TI 6.35X35 (Rod) ×2 IMPLANT
SCREW PA DLX CREO 7.5X45 (Screw) ×4 IMPLANT
SPACER ALTERA 10X31 8-12MM-8 (Spacer) ×1 IMPLANT
SPONGE NEURO XRAY DETECT 1X3 (DISPOSABLE) IMPLANT
SPONGE SURGIFOAM ABS GEL 100 (HEMOSTASIS) IMPLANT
SPONGE T-LAP 4X18 ~~LOC~~+RFID (SPONGE) IMPLANT
STRIP CLOSURE SKIN 1/2X4 (GAUZE/BANDAGES/DRESSINGS) ×2 IMPLANT
SUT VIC AB 1 CT1 18XBRD ANBCTR (SUTURE) ×2 IMPLANT
SUT VIC AB 1 CT1 8-18 (SUTURE) ×4
SUT VIC AB 2-0 CP2 18 (SUTURE) ×4 IMPLANT
SYR 20ML LL LF (SYRINGE) IMPLANT
TOWEL GREEN STERILE (TOWEL DISPOSABLE) ×2 IMPLANT
TOWEL GREEN STERILE FF (TOWEL DISPOSABLE) ×2 IMPLANT
TRAY FOLEY MTR SLVR 16FR STAT (SET/KITS/TRAYS/PACK) ×1 IMPLANT
WATER STERILE IRR 1000ML POUR (IV SOLUTION) ×2 IMPLANT

## 2021-01-08 NOTE — H&P (Signed)
Subjective: The patient is a 75 year old white female on whom I previously formed an L4-5 discectomy.  She initially did well but developed recurrent back and leg pain consistent with a lumbar radiculopathy.  She failed medical management.  She was worked up with a lumbar MRI which demonstrated a recurrent herniated disc at L4-5 with severe foraminal stenosis.  I discussed the various treatment options with her.  She has decided proceed with surgery.  Past Medical History:  Diagnosis Date   Anxiety    CAD (coronary artery disease)    PCI of an occluded right coronary artery 2001. 70% LAD stenosis, 30% circumflex stenosis.   Diverticulosis    Hyperlipidemia    Hypertension    IBS (irritable bowel syndrome)    Leukocytosis, unspecified 07/24/2012   Myocardial infarction [facility]) 2001   Need for prophylactic hormone replacement therapy (postmenopausal)    Osteopenia    Osteoporosis    PONV (postoperative nausea and vomiting)    Thyroid cancer ([place]) 2004   Dr. Clovia Cuff    Thyroid nodule 2004   Tobacco abuse     Past Surgical History:  Procedure Laterality Date   [place] SURGERY  06/2000   CARDIAC CATHETERIZATION  2001   CHOLECYSTECTOMY  07/2002   EYE SURGERY Bilateral 2019   cataract   LIPOMA EXCISION Right 08/2003   r arm    LUMBAR LAMINECTOMY/DECOMPRESSION MICRODISCECTOMY Left 03/20/2020   Procedure: LEFT LUMBAR FOUR-FIVE MICRODISCECTOMY;  Surgeon: Newman Pies, MD;  Location: [facility];  Service: Neurosurgery;  Laterality: Left;   peserie     THYROIDECTOMY Bilateral 02/2003    Allergies  Allergen Reactions   Banana Itching and Swelling    Throat and ears itch,ears feel like they swell   Bupropion Hcl     REACTION: paranoia   Chantix [Varenicline]     paranoia   Latex Rash   Zetia [Ezetimibe]     Myalgia    Social History   Tobacco Use   Smoking status: Every Day    Packs/day: 1.00    Years: 40.00    Pack years: 40.00    Types: Cigarettes   Smokeless  tobacco: Never  Substance Use Topics   Alcohol use: No    Family History  Problem Relation Age of Onset   Coronary artery disease Father    Heart disease Father    Cirrhosis Mother    Alcohol abuse Mother    Cancer Other        family history    Hypertension Maternal Grandmother    Prior to Admission medications   Medication Sig Start Date End Date Taking? Authorizing Provider  amLODipine (NORVASC) 5 MG tablet TAKE ONE (1) TABLET EACH DAY 12/29/20  Yes Ronnie Doss M, DO  aspirin EC 81 MG tablet Take 1 tablet (81 mg total) by mouth daily. Swallow whole. 04/23/20  Yes Minus Breeding, MD  busPIRone (BUSPAR) 30 MG tablet Take 1 tablet (30 mg total) by mouth 2 (two) times daily. 12/29/20  Yes Ronnie Doss M, DO  conjugated estrogens (PREMARIN) vaginal cream Place 1 Applicatorful vaginally 3 (three) times a week. Patient taking differently: Place 1 Applicatorful vaginally 3 (three) times a week. .625 mg 06/27/20  Yes Gottschalk, Ashly M, DO  fenofibrate 160 MG tablet Take 1 tablet (160 mg total) by mouth daily. 12/29/20  Yes Gottschalk, Leatrice Jewels M, DO  gabapentin (NEURONTIN) 300 MG capsule Take 300 mg by mouth 3 (three) times daily. 04/28/20  Yes [provider]  levothyroxine (SYNTHROID) 125 MCG tablet Take 125 mcg by mouth See admin instructions. Take 125 mg every day EXCEPT: Takes 62.5 mg  on sundays   Yes [provider]  LORazepam (ATIVAN) 1 MG tablet Take 0.5-1 tablets (0.5-1 mg total) by mouth daily as needed for anxiety. 12/29/20  Yes Gottschalk, Leatrice Jewels M, DO  losartan (COZAAR) 100 MG tablet Take 1 tablet (100 mg total) by mouth daily. 12/29/20  Yes Gottschalk, Leatrice Jewels M, DO  Melatonin 5 MG TABS Take 5 mg by mouth at bedtime as needed (sleep).   Yes [provider]  metoprolol tartrate (LOPRESSOR) 50 MG tablet TAKE ONE (1) TABLET EACH DAY 12/29/20  Yes Gottschalk, Ashly M, DO  nitroGLYCERIN (NITROSTAT) 0.4 MG SL tablet Place 1 tablet (0.4 mg total) under the  tongue every 5 (five) minutes as needed for chest pain. 07/02/19 05/26/21 Yes Gottschalk, Ashly M, DO  rosuvastatin (CRESTOR) 40 MG tablet TAKE ONE (1) TABLET EACH DAY 12/29/20  Yes Ronnie Doss M, DO  Vitamin D, Ergocalciferol, (DRISDOL) 1.25 MG (50000 UNIT) CAPS capsule Take 1 capsule (50,000 Units total) by mouth every 7 (seven) days. Patient taking differently: Take 50,000 Units by mouth every 7 (seven) days. Sunday 12/28/19  Yes Ronnie Doss M, DO     Review of Systems  Positive ROS: As above  All other systems have been reviewed and were otherwise negative with the exception of those mentioned in the HPI and as above.  Objective: Vital signs in last 24 hours: Temp:  [98 F (36.7 C)] 98 F (36.7 C) (09/22 0956) Pulse Rate:  [70] 70 (09/22 0956) Resp:  [18] 18 (09/22 0956) BP: (159-171)/(86-90) 159/86 (09/22 1014) SpO2:  [94 %] 94 % (09/22 0956) Weight:  [73.9 kg] 73.9 kg (09/22 0956) Estimated body mass index is 27.12 kg/m as calculated from the following:   Height as of this encounter: 5\' 5"  (1.651 m).   Weight as of this encounter: 73.9 kg.   General Appearance: Alert Head: Normocephalic, without obvious abnormality, atraumatic Eyes: PERRL, conjunctiva/corneas clear, EOM's intact,    Ears: Normal  Throat: Normal  Neck: Supple, Back: Her lumbar incision is well-healed.  Positive straight leg raise test on the left. Lungs: Clear to auscultation bilaterally, respirations unlabored Heart: Regular rate and rhythm, no murmur, rub or gallop Abdomen: Soft, non-tender Extremities: Extremities normal, atraumatic, no cyanosis or edema Skin: unremarkable  NEUROLOGIC:   Mental status: alert and oriented,Motor Exam - grossly normal Sensory Exam - grossly normal Reflexes:  Coordination - grossly normal Gait - grossly normal Balance - grossly normal Cranial Nerves: I: smell Not tested  II: visual acuity  OS: Normal  OD: Normal   II: visual fields Full to confrontation   II: pupils Equal, round, reactive to light  III,VII: ptosis None  III,IV,VI: extraocular muscles  Full ROM  V: mastication Normal  V: facial light touch sensation  Normal  V,VII: corneal reflex  Present  VII: facial muscle function - upper  Normal  VII: facial muscle function - lower Normal  VIII: hearing Not tested  IX: soft palate elevation  Normal  IX,X: gag reflex Present  XI: trapezius strength  5/5  XI: sternocleidomastoid strength 5/5  XI: neck flexion strength  5/5  XII: tongue strength  Normal    Data Review Lab Results  Component Value Date   WBC 12.0 (H) 12/26/2020   HGB 16.5 (H) 12/26/2020   HCT 51.1 (H) 12/26/2020   MCV 95.3 12/26/2020   PLT  239 12/26/2020   Lab Results  Component Value Date   NA 139 12/29/2020   K 4.0 12/29/2020   CL 104 12/29/2020   CO2 18 (L) 12/29/2020   BUN 12 12/29/2020   CREATININE 0.73 12/29/2020   GLUCOSE 104 (H) 12/29/2020   No results found for: INR, PROTIME  Assessment/Plan: Lumbar degenerative disease, recurrent herniated disc, foraminal stenosis, lumbago, lumbar radiculopathy: I have discussed the situation with the patient.  I reviewed her imaging studies with her and pointed out the abnormalities.  We have discussed the various treatment options including surgery.  I have described the surgical treatment option of an L4-5 decompression, instrumentation and fusion.  I have shown her surgical models.  I have given her a surgical pamphlet.  We have discussed the risk, benefits, alternatives, expected postop course, and likelihood of achieving our goals with surgery.  I have answered all her questions.  She has decided proceed with surgery.   Madison [name] 01/08/2021 10:35 AM

## 2021-01-08 NOTE — Progress Notes (Signed)
Orthopedic Tech Progress Note Patient Details:  Madison [name] 1945-07-02 [id number]  Ortho Devices Type of Ortho Device: Lumbar corsett Ortho Device/Splint Interventions: Ordered  Left in room    Madison [name] 01/08/2021, 5:01 PM

## 2021-01-08 NOTE — Anesthesia Postprocedure Evaluation (Signed)
Anesthesia Post Note  Patient: Madison [name]  Procedure(s) Performed: POSTERIOR LUMBAR INTERBODY FUSION,POSTERIOR INSTRUMENTATION, LUMBAR FOUR-FIVE (Spine Lumbar)     Patient location during evaluation: PACU Anesthesia Type: General Level of consciousness: awake and alert Pain management: pain level controlled Vital Signs Assessment: post-procedure vital signs reviewed and stable Respiratory status: spontaneous breathing, nonlabored ventilation, respiratory function stable and patient connected to nasal cannula oxygen Cardiovascular status: blood pressure returned to baseline and stable Postop Assessment: no apparent nausea or vomiting Anesthetic complications: no   No notable events documented.  Last Vitals:  Vitals:   01/08/21 1537 01/08/21 1612  BP: (!) 143/82 (!) 155/72  Pulse: 62 64  Resp: 13 18  Temp: 36.5 C 36.7 C  SpO2: 94% 93%    Last Pain:  Vitals:   01/08/21 1727  TempSrc:   PainSc: 3                  Everitt Wenner P Aislee Landgren

## 2021-01-08 NOTE — Anesthesia Procedure Notes (Signed)
Procedure Name: Intubation Date/Time: 01/08/2021 11:25 AM Performed by: Rosine Door, RN Pre-anesthesia Checklist: Patient identified, Emergency Drugs available, Suction available and Patient being monitored Patient Re-evaluated:Patient Re-evaluated prior to induction Oxygen Delivery Method: Circle system utilized Preoxygenation: Pre-oxygenation with 100% oxygen Induction Type: IV induction Ventilation: Mask ventilation without difficulty Laryngoscope Size: Mac and 3 Grade View: Grade II Tube type: Oral Tube size: 7.0 mm Number of attempts: 1 Airway Equipment and Method: Stylet and Oral airway Placement Confirmation: ETT inserted through vocal cords under direct vision, positive ETCO2 and breath sounds checked- equal and bilateral Secured at: 21 cm Tube secured with: Tape Dental Injury: Teeth and Oropharynx as per pre-operative assessment  Comments: Atraumatic placement

## 2021-01-08 NOTE — Op Note (Signed)
Brief history: The patient is a 75 year old white female on whom I previously performed an L4-5 discectomy.  She initially did well but has developed recurrent back and left leg pain consistent with a lumbar radiculopathy.  She has failed medical management and was worked up with a lumbar MRI which demonstrated recurrent herniated disc at L4-5.  I discussed the various treatment options with her.  She has decided proceed with surgery.  Preoperative diagnosis: Recurrent L4-5 herniated disc, degenerative disc disease,  lumbago; lumbar radiculopathy;   Postoperative diagnosis: The same  Procedure: Bilateral L4-5 laminotomy/foraminotomies/medial facetectomy to decompress the bilateral L4 and L5 nerve roots(the work required to do this was in addition to the work required to do the posterior lumbar interbody fusion because of the patient's recurrent herniated disc, epidural scar tissue,, facet arthropathy. Etc. requiring a wide decompression of the nerve roots.);  Left L4-5 transforaminal lumbar interbody fusion with local morselized autograft bone and Zimmer DBM; insertion of interbody prosthesis at L4-5 (globus peek expandable interbody prosthesis); posterior nonsegmental instrumentation from L4 to L5 with globus titanium pedicle screws and rods; posterior lateral arthrodesis at L4-5 with local morselized autograft bone and Zimmer DBM.  Surgeon: Dr. Earle Gell  Asst.: Arnetha Massy, NP  Anesthesia: Gen. endotracheal  Estimated blood loss: 200 cc  Drains: None  Complications: None  Description of procedure: The patient was brought to the operating room by the anesthesia team. General endotracheal anesthesia was induced. The patient was turned to the prone position on the Wilson frame. The patient's lumbosacral region was then prepared with Betadine scrub and Betadine solution. Sterile drapes were applied.  I then injected the area to be incised with Marcaine with epinephrine solution. I then used  the scalpel to make a linear midline incision over the L4-5 interspace, incising through the old surgical scar. I then used electrocautery to perform a bilateral subperiosteal dissection exposing the spinous process and lamina of L4 and L5. We then obtained intraoperative radiograph to confirm our location. We then inserted the Verstrac retractor to provide exposure.  I began the decompression by using the high speed drill to perform laminotomies at L4-5 bilaterally. We then used the Kerrison punches to widen the laminotomy and removed the ligamentum flavum and epidural scar tissue at L4-5 bilaterally. We used the Kerrison punches to remove the medial facets at L4-5 bilaterally, we remove the the left L4-5 facet.  We encountered a recurrent L4-5 lateral herniated disc as expected.  We removed it with a pituitary forceps decompressing the L4 nerve.  We now turned our attention to the posterior lumbar interbody fusion. I used a scalpel to incise the intervertebral disc at L4-5 bilaterally. I then performed a partial intervertebral discectomy at L4-5 bilaterally using the pituitary forceps. We prepared the vertebral endplates at X4-4 bilaterally for the fusion by removing the soft tissues with the curettes. We then used the trial spacers to pick the appropriate sized interbody prosthesis. We prefilled his prosthesis with a combination of local morselized autograft bone that we obtained during the decompression as well as Zimmer DBM. We inserted the prefilled prosthesis into the interspace at L4-5 from the left, we then turned and expanded the prosthesis. There was a good snug fit of the prosthesis in the interspace. We then filled and the remainder of the intervertebral disc space with local morselized autograft bone and Zimmer DBM. This completed the posterior lumbar interbody arthrodesis.  During the decompression and insertion of the prosthesis the assistant protected the thecal sac and  nerve roots with the  D'Errico retractor.  We now turned attention to the instrumentation. Under fluoroscopic guidance we cannulated the bilateral L4 and L5 pedicles with the bone probe. We then removed the bone probe. We then tapped the pedicle with a 6.5 millimeter tap. We then removed the tap. We probed inside the tapped pedicle with a ball probe to rule out cortical breaches. We then inserted a 7.5 x 45 millimeter pedicle screw into the L4 and L5 pedicles bilaterally under fluoroscopic guidance. We then palpated along the medial aspect of the pedicles to rule out cortical breaches. There were none. The nerve roots were not injured. We then connected the unilateral pedicle screws with a lordotic rod. We compressed the construct and secured the rod in place with the caps. We then tightened the caps appropriately. This completed the instrumentation from L4-5 bilaterally.  We now turned our attention to the posterior lateral arthrodesis at L4-5. We used the high-speed drill to decorticate the remainder of the facets, pars, transverse process at L4-5. We then applied a combination of local morselized autograft bone and Zimmer DBM over these decorticated posterior lateral structures. This completed the posterior lateral arthrodesis.  We then obtained hemostasis using bipolar electrocautery. We irrigated the wound out with bacitracin solution. We inspected the thecal sac and nerve roots and noted they were well decompressed. We then removed the retractor.  We injected Exparel . We reapproximated patient's thoracolumbar fascia with interrupted #1 Vicryl suture. We reapproximated patient's subcutaneous tissue with interrupted 2-0 Vicryl suture. The reapproximated patient's skin with Steri-Strips and benzoin. The wound was then coated with bacitracin ointment. A sterile dressing was applied. The drapes were removed. The patient was subsequently returned to the supine position where they were extubated by the anesthesia team. He was then  transported to the post anesthesia care unit in stable condition. All sponge instrument and needle counts were reportedly correct at the end of this case.

## 2021-01-08 NOTE — Anesthesia Preprocedure Evaluation (Signed)
Anesthesia Evaluation  Patient identified by MRN, date of birth, ID band Patient awake    Reviewed: Allergy & Precautions, NPO status , Patient's Chart, lab work & pertinent test results  History of Anesthesia Complications (+) PONV  Airway Mallampati: II  TM Distance: >3 FB Neck ROM: Full    Dental  (+) Teeth Intact   Pulmonary neg pulmonary ROS, Current Smoker and Patient abstained from smoking.,    Pulmonary exam normal        Cardiovascular hypertension, Pt. on medications and Pt. on home beta blockers + CAD, + Past MI and + Cardiac Stents  + dysrhythmias (RBBB)  Rhythm:Irregular Rate:Normal     Neuro/Psych Anxiety negative neurological ROS     GI/Hepatic negative GI ROS, Neg liver ROS,   Endo/Other  negative endocrine ROS  Renal/GU negative Renal ROS  negative genitourinary   Musculoskeletal  (+) Arthritis , Osteoarthritis,  Recurrent disc herniation   Abdominal (+)  Abdomen: soft.    Peds  Hematology negative hematology ROS (+)   Anesthesia Other Findings   Reproductive/Obstetrics                             Anesthesia Physical Anesthesia Plan  ASA: 3  Anesthesia Plan: General   Post-op Pain Management:    Induction:   PONV Risk Score and Plan: Ondansetron, Dexamethasone and Treatment may vary due to age or medical condition  Airway Management Planned: Mask and Oral ETT  Additional Equipment: None  Intra-op Plan:   Post-operative Plan: Extubation in OR  Informed Consent: I have reviewed the patients History and Physical, chart, labs and discussed the procedure including the risks, benefits and alternatives for the proposed anesthesia with the patient or authorized representative who has indicated his/her understanding and acceptance.     Dental advisory given  Plan Discussed with: CRNA  Anesthesia Plan Comments: (Lab Results      Component                 Value               Date                      WBC                      12.0 (H)            12/26/2020                HGB                      16.5 (H)            12/26/2020                HCT                      51.1 (H)            12/26/2020                MCV                      95.3                12/26/2020                PLT  239                 12/26/2020           Lab Results      Component                Value               Date                      NA                       139                 12/29/2020                K                        4.0                 12/29/2020                CO2                      18 (L)              12/29/2020                GLUCOSE                  104 (H)             12/29/2020                BUN                      12                  12/29/2020                CREATININE               0.73                12/29/2020                CALCIUM                  9.5                 12/29/2020                GFRNONAA                 >60                 12/26/2020                GFRAA                    82                  12/28/2019           Nuclear stress test 03/15/17: ? The left ventricular ejection fraction is normal (55-65%). ? Nuclear stress EF: 64%. ? No T wave inversion was noted during stress. ? There was no ST segment deviation noted during stress. ? This is a low risk study. No  reversible ischemia. LVEF 64% with normal wall motion. Mild RV uptake noted, suggestive of elevated pulmonary pressure. This is a low risk study.   Echo 02/25/17: Study Conclusions  - Left ventricle: The cavity size was normal. Wall thickness was  increased in a pattern of mild LVH. Systolic function was normal.  The estimated ejection fraction was in the range of 55% to 60%.  Wall motion was normal; there were no regional wall motion  abnormalities. Doppler parameters are consistent with abnormal  left ventricular relaxation  (grade 1 diastolic dysfunction). The  E/e&' ratio is between 8-15, suggesting indeterminate LV filling  pressure.  - Mitral valve: Calcified annulus. Mildly thickened leaflets .  There was trivial regurgitation.  - Left atrium: The atrium was normal in size.  - Inferior vena cava: The vessel was normal in size. The  respirophasic diameter changes were in the normal range (>= 50%),  consistent with normal central venous pressure.  Impressions:  - LVEF 55-60%, mild LVH, normal wall motion, grade 1 DD,  indeterminate LV filling pressure, trivial MR, normal LA size,  normal IVC. )        Anesthesia Quick Evaluation

## 2021-01-08 NOTE — Transfer of Care (Signed)
Immediate Anesthesia Transfer of Care Note  Patient: Madison [name]  Procedure(s) Performed: POSTERIOR LUMBAR INTERBODY FUSION,POSTERIOR INSTRUMENTATION, LUMBAR FOUR-FIVE (Spine Lumbar)  Patient Location: PACU  Anesthesia Type:General  Level of Consciousness: awake, alert  and oriented  Airway & Oxygen Therapy: Patient Spontanous Breathing and Patient connected to face mask oxygen  Post-op Assessment: Report given to RN and Post -op Vital signs reviewed and stable  Post vital signs: Reviewed and stable  Last Vitals:  Vitals Value Taken Time  BP 128/68 01/08/21 1437  Temp 37.2 C 01/08/21 1437  Pulse 59 01/08/21 1442  Resp 16 01/08/21 1442  SpO2 93 % 01/08/21 1442  Vitals shown include unvalidated device data.  Last Pain:  Vitals:   01/08/21 1437  TempSrc:   PainSc: 0-No pain         Complications: No notable events documented.

## 2021-01-09 LAB — BASIC METABOLIC PANEL
Anion gap: 8 (ref 5–15)
BUN: 15 mg/dL (ref 8–23)
CO2: 28 mmol/L (ref 22–32)
Calcium: 9 mg/dL (ref 8.9–10.3)
Chloride: 102 mmol/L (ref 98–111)
Creatinine, Ser: 1.12 mg/dL — ABNORMAL HIGH (ref 0.44–1.00)
GFR, Estimated: 52 mL/min — ABNORMAL LOW (ref >60)
Glucose, Bld: 110 mg/dL — ABNORMAL HIGH (ref 70–99)
Potassium: 3.9 mmol/L (ref 3.5–5.1)
Sodium: 138 mmol/L (ref 135–145)

## 2021-01-09 LAB — CBC
HCT: 43.4 % (ref 36.0–46.0)
Hemoglobin: 13.8 g/dL (ref 12.0–15.0)
MCH: 30.5 pg (ref 26.0–34.0)
MCHC: 31.8 g/dL (ref 30.0–36.0)
MCV: 96 fL (ref 80.0–100.0)
Platelets: 312 10*3/uL (ref 150–400)
RBC: 4.52 MIL/uL (ref 3.87–5.11)
RDW: 13 % (ref 11.5–15.5)
WBC: 20.2 10*3/uL — ABNORMAL HIGH (ref 4.0–10.5)
nRBC: 0 % (ref 0.0–0.2)

## 2021-01-09 MED ORDER — OXYCODONE-ACETAMINOPHEN 5-325 MG PO TABS: 1.0000 | ORAL_TABLET | ORAL | 0 refills | Status: DC | PRN

## 2021-01-09 MED ORDER — OXYCODONE-ACETAMINOPHEN 5-325 MG PO TABS: 1.0000 | ORAL_TABLET | ORAL | Status: DC | PRN

## 2021-01-09 MED ORDER — DOCUSATE SODIUM 100 MG PO CAPS: 100.0000 mg | ORAL_CAPSULE | Freq: Two times a day (BID) | ORAL | 0 refills | Status: DC

## 2021-01-09 NOTE — Plan of Care (Signed)

## 2021-01-09 NOTE — Evaluation (Signed)
Physical Therapy Evaluation Patient Details Name: Madison [name] MRN: [id number] DOB: 04-Jul-1945 Today's Date: 01/09/2021  History of Present Illness  75 y.o. female presents to [facility] hospital on 01/08/2021 with lumbar radiculopathy. Pt underwent L4-5 decompression and fusion on 01/08/2021. PMH includes anxiety, CAD, HLD, HTN, MI, osteoporosis, thyroid cancer.  Clinical Impression  Pt presents to PT with deficits in gait, balance, power, endurance. Pt is able to ambulate for household distances and negotiate stairs without physical assistance. Pt currently benefits from UE support of walker to improve balance and reduce falls risk. Pt will benefit from continued acute PT services to reduce falls risk and restore independence. PT recommends discharge home with a RW.       Recommendations for follow up therapy are one component of a multi-disciplinary discharge planning process, led by the attending physician.  Recommendations may be updated based on patient status, additional functional criteria and insurance authorization.  Follow Up Recommendations No PT follow up    Equipment Recommendations  Rolling walker with 5" wheels    Recommendations for Other Services       Precautions / Restrictions Precautions Precautions: Back Precaution Booklet Issued: Yes (comment) Required Braces or Orthoses: Spinal Brace Spinal Brace: Lumbar corset;Applied in sitting position Restrictions Weight Bearing Restrictions: No      Mobility  Bed Mobility Overal bed mobility: Needs Assistance Bed Mobility: Rolling;Sidelying to Sit;Sit to Sidelying Rolling: Supervision Sidelying to sit: Supervision     Sit to sidelying: Supervision      Transfers Overall transfer level: Needs assistance Equipment used: Rolling walker (2 wheeled) Transfers: Sit to/from Stand Sit to Stand: Supervision            Ambulation/Gait Ambulation/Gait assistance: Supervision Gait Distance (Feet): 300 Feet Assistive  device: Rolling walker (2 wheeled) Gait Pattern/deviations: Step-through pattern Gait velocity: functional Gait velocity interpretation: 1.31 - 2.62 ft/sec, indicative of limited community ambulator General Gait Details: pt with steady step through gait with UE support of RW, pt utilizing hand hold of wall when attempting short bout of gait without walker  Stairs Stairs: Yes Stairs assistance: Supervision Stair Management: One rail Right;Step to pattern Number of Stairs: 3    Wheelchair Mobility    Modified Rankin (Stroke Patients Only)       Balance Overall balance assessment: Needs assistance Sitting-balance support: No upper extremity supported;Feet supported Sitting balance-Leahy Scale: Good     Standing balance support: Single extremity supported Standing balance-Leahy Scale: Poor Standing balance comment: pt benefits from UE support of walker or wall                             Pertinent Vitals/Pain Pain Assessment: 0-10 Pain Score: 4  Faces Pain Scale: Hurts little more Pain Location: low back Pain Descriptors / Indicators: Aching Pain Intervention(s): Monitored during session    Home Living Family/patient expects to be discharged to:: Private residence Living Arrangements: Spouse/significant other Available Help at Discharge: Family;Available 24 hours/day Type of Home: House Home Access: Stairs to enter Entrance Stairs-Rails: Right Entrance Stairs-Number of Steps: 3 Home Layout: One level Home Equipment: Cane - single point      Prior Function Level of Independence: Independent               Hand Dominance   Dominant Hand: Right    Extremity/Trunk Assessment   Upper Extremity Assessment Upper Extremity Assessment: Overall WFL for tasks assessed    Lower Extremity Assessment Lower Extremity Assessment:  Generalized weakness    Cervical / Trunk Assessment Cervical / Trunk Assessment: Other exceptions Cervical / Trunk  Exceptions: spine surgery  Communication   Communication: No difficulties  Cognition Arousal/Alertness: Awake/alert Behavior During Therapy: WFL for tasks assessed/performed Overall Cognitive Status: Within Functional Limits for tasks assessed                                        General Comments General comments (skin integrity, edema, etc.): VSS on RA    Exercises     Assessment/Plan    PT Assessment Patient needs continued PT services  PT Problem List Decreased strength;Decreased activity tolerance;Decreased balance;Decreased mobility;Pain       PT Treatment Interventions DME instruction;Gait training;Stair training;Functional mobility training;Therapeutic exercise;Therapeutic activities;Balance training;Neuromuscular re-education;Patient/family education    PT Goals (Current goals can be found in the Care Plan section)  Acute Rehab PT Goals Patient Stated Goal: to go home PT Goal Formulation: With patient Time For Goal Achievement: 01/14/21 Potential to Achieve Goals: Good    Frequency Min 5X/week   Barriers to discharge        Co-evaluation               AM-PAC PT "6 Clicks" Mobility  Outcome Measure Help needed turning from your back to your side while in a flat bed without using bedrails?: A Little Help needed moving from lying on your back to sitting on the side of a flat bed without using bedrails?: A Little Help needed moving to and from a bed to a chair (including a wheelchair)?: A Little Help needed standing up from a chair using your arms (e.g., wheelchair or bedside chair)?: A Little Help needed to walk in hospital room?: A Little Help needed climbing 3-5 steps with a railing? : A Little 6 Click Score: 18    End of Session Equipment Utilized During Treatment: Back brace Activity Tolerance: Patient tolerated treatment well Patient left: in bed;with call bell/phone within reach Nurse Communication: Mobility status PT Visit  Diagnosis: Other abnormalities of gait and mobility (R26.89)    Time: 7628-3151 PT Time Calculation (min) (ACUTE ONLY): 12 min   Charges:   PT Evaluation $PT Eval Low Complexity: Pastos, PT, DPT Acute Rehabilitation Pager: [phone]   Zenaida Niece 01/09/2021, 9:37 AM

## 2021-01-09 NOTE — Discharge Summary (Signed)
Physician Discharge Summary  Patient ID: Madison [name] MRN: [id number] DOB/AGE: 01-11-1946 75 y.o.  Admit date: 01/08/2021 Discharge date: 01/09/2021  Admission Diagnoses: Recurrent herniated lumbar disc, lumbar radiculopathy, lumbago, lumbar degenerative disc disease, lumbar foraminal stenosis  Discharge Diagnoses: The same Active Problems:   Recurrent herniation of lumbar disc   Discharged Condition: good  Hospital Course: I performed an L4-5 decompression, instrumentation and fusion on the patient on 01/08/2021.  The surgery went well.  The patient's postoperative course was unremarkable.  On postoperative day #1 she requested discharge home.  She was given verbal and written discharge instructions.  All her questions were answered.  Consults: PT, OT, care management Significant Diagnostic Studies: None Treatments: L4-5 decompression, instrumentation and fusion. Discharge Exam: Blood pressure (!) 163/80, pulse 65, temperature 97.8 F (36.6 C), temperature source Oral, resp. rate 18, height 5\' 5"  (1.651 m), weight 73.9 kg, SpO2 93 %. The patient is alert and pleasant.  She looks well.  Her strength is normal.  Disposition: Home  Discharge Instructions     Call MD for:  difficulty breathing, headache or visual disturbances   Complete by: As directed    Call MD for:  extreme fatigue   Complete by: As directed    Call MD for:  hives   Complete by: As directed    Call MD for:  persistant dizziness or light-headedness   Complete by: As directed    Call MD for:  persistant nausea and vomiting   Complete by: As directed    Call MD for:  redness, tenderness, or signs of infection (pain, swelling, redness, odor or green/yellow discharge around incision site)   Complete by: As directed    Call MD for:  severe uncontrolled pain   Complete by: As directed    Call MD for:  temperature >100.4   Complete by: As directed    Diet - low sodium heart healthy   Complete by: As directed     Discharge instructions   Complete by: As directed    Call [phone] for a followup appointment. Take a stool softener while you are using pain medications.   Driving Restrictions   Complete by: As directed    Do not drive for 2 weeks.   Increase activity slowly   Complete by: As directed    Lifting restrictions   Complete by: As directed    Do not lift more than 5 pounds. No excessive bending or twisting.   May shower / Bathe   Complete by: As directed    Remove the dressing for 3 days after surgery.  You may shower, but leave the incision alone.   Remove dressing in 48 hours   Complete by: As directed       Allergies as of 01/09/2021       Reactions   Banana Itching, Swelling   Throat and ears itch,ears feel like they swell   Bupropion Hcl    REACTION: paranoia   Chantix [varenicline]    paranoia   Latex Rash   Zetia [ezetimibe]    Myalgia        Medication List     TAKE these medications    amLODipine 5 MG tablet Commonly known as: NORVASC TAKE ONE (1) TABLET EACH DAY   aspirin EC 81 MG tablet Take 1 tablet (81 mg total) by mouth daily. Swallow whole.   busPIRone 30 MG tablet Commonly known as: BUSPAR Take 1 tablet (30 mg total) by mouth 2 (two) times  daily.   docusate sodium 100 MG capsule Commonly known as: COLACE Take 1 capsule (100 mg total) by mouth 2 (two) times daily.   fenofibrate 160 MG tablet Take 1 tablet (160 mg total) by mouth daily.   gabapentin 300 MG capsule Commonly known as: NEURONTIN Take 300 mg by mouth 3 (three) times daily.   levothyroxine 125 MCG tablet Commonly known as: SYNTHROID Take 125 mcg by mouth See admin instructions. Take 125 mg every day EXCEPT: Takes 62.5 mg  on sundays   LORazepam 1 MG tablet Commonly known as: ATIVAN Take 0.5-1 tablets (0.5-1 mg total) by mouth daily as needed for anxiety.   losartan 100 MG tablet Commonly known as: COZAAR Take 1 tablet (100 mg total) by mouth daily.   melatonin 5 MG  Tabs Take 5 mg by mouth at bedtime as needed (sleep).   metoprolol tartrate 50 MG tablet Commonly known as: LOPRESSOR TAKE ONE (1) TABLET EACH DAY   nitroGLYCERIN 0.4 MG SL tablet Commonly known as: Nitrostat Place 1 tablet (0.4 mg total) under the tongue every 5 (five) minutes as needed for chest pain.   oxyCODONE-acetaminophen 5-325 MG tablet Commonly known as: PERCOCET/ROXICET Take 1-2 tablets by mouth every 4 (four) hours as needed for moderate pain.   Premarin vaginal cream Generic drug: conjugated estrogens Place 1 Applicatorful vaginally 3 (three) times a week. What changed: additional instructions   rosuvastatin 40 MG tablet Commonly known as: CRESTOR TAKE ONE (1) TABLET EACH DAY   Vitamin D (Ergocalciferol) 1.25 MG (50000 UNIT) Caps capsule Commonly known as: DRISDOL Take 1 capsule (50,000 Units total) by mouth every 7 (seven) days. What changed: additional instructions         Signed: Ophelia Charter 01/09/2021, 6:56 AM

## 2021-01-09 NOTE — Evaluation (Signed)
Occupational Therapy Evaluation Patient Details Name: Madison [name] MRN: [id number] DOB: Oct 04, 1945 Today's Date: 01/09/2021   History of Present Illness 75 year old white female with lumbar radiculopathy, s/p L4-5 laminotomy/foraminotomies and posterior lumbar interbody fusion.  PMH includes: CAD, Diverticulosis, Hyperlipidemia, Hypertension, IBS, Myocardial infarction, Osteoporosis.   Clinical Impression   Patient admitted for the diagnosis above.  PTA she lives with her spouse, and was essentially independent with ADL/IADl and functional mobility.  Deficits impacting independence are listed below.  Currently she is using a RW for in room mobility, but needs no assist for toileting or ADL completion.  Recommend home with assist as needed from spouse.  All precautions reviewed, no further acute OT needs.        Recommendations for follow up therapy are one component of a multi-disciplinary discharge planning process, led by the attending physician.  Recommendations may be updated based on patient status, additional functional criteria and insurance authorization.   Follow Up Recommendations  No OT follow up    Equipment Recommendations  None recommended by OT    Recommendations for Other Services       Precautions / Restrictions Precautions Precautions: Back Precaution Booklet Issued: Yes (comment) Required Braces or Orthoses: Spinal Brace Spinal Brace: Lumbar corset Restrictions Weight Bearing Restrictions: No      Mobility Bed Mobility Overal bed mobility: Modified Independent                  Transfers Overall transfer level: Modified independent Equipment used: Rolling walker (2 wheeled)                  Balance Overall balance assessment: Needs assistance Sitting-balance support: Feet supported Sitting balance-Leahy Scale: Good     Standing balance support: Bilateral upper extremity supported Standing balance-Leahy Scale: Fair                              ADL either performed or assessed with clinical judgement   ADL Overall ADL's : At baseline                                       General ADL Comments: able to complete dressing from sit/stand level.     Vision Patient Visual Report: No change from baseline       Perception     Praxis      Pertinent Vitals/Pain Pain Assessment: Faces Faces Pain Scale: Hurts little more Pain Location: incisional Pain Descriptors / Indicators: Grimacing;Guarding Pain Intervention(s): Monitored during session     Hand Dominance Right   Extremity/Trunk Assessment Upper Extremity Assessment Upper Extremity Assessment: Overall WFL for tasks assessed   Lower Extremity Assessment Lower Extremity Assessment: Defer to PT evaluation   Cervical / Trunk Assessment Cervical / Trunk Assessment: Other exceptions Cervical / Trunk Exceptions: spine surgery   Communication Communication Communication: No difficulties   Cognition Arousal/Alertness: Awake/alert Behavior During Therapy: WFL for tasks assessed/performed Overall Cognitive Status: Within Functional Limits for tasks assessed                                     General Comments       Exercises     Shoulder Instructions      Home Living Family/patient expects to be discharged to:: Private residence  Living Arrangements: Spouse/significant other Available Help at Discharge: Family;Available 24 hours/day Type of Home: House Home Access: Stairs to enter [employer] of Steps: 3 Entrance Stairs-Rails: Right Home Layout: One level     Bathroom Shower/Tub: Teacher, early years/pre: Handicapped height     Home Equipment: None          Prior Functioning/Environment Level of Independence: Independent                 OT Problem List: Pain      OT Treatment/Interventions:      OT Goals(Current goals can be found in the care plan section) Acute  Rehab OT Goals Patient Stated Goal: return home OT Goal Formulation: With patient Time For Goal Achievement: 01/09/21 Potential to Achieve Goals: Good  OT Frequency:     Barriers to D/C:  None          Co-evaluation              AM-PAC OT "6 Clicks" Daily Activity     Outcome Measure Help from another person eating meals?: None Help from another person taking care of personal grooming?: None Help from another person toileting, which includes using toliet, bedpan, or urinal?: None Help from another person bathing (including washing, rinsing, drying)?: None Help from another person to put on and taking off regular upper body clothing?: None Help from another person to put on and taking off regular lower body clothing?: None 6 Click Score: 24   End of Session Equipment Utilized During Treatment: Rolling walker;Back brace Nurse Communication: Mobility status  Activity Tolerance: Patient tolerated treatment well Patient left: in chair;with call bell/phone within reach  OT Visit Diagnosis: Pain                Time: 3491-7915 OT Time Calculation (min): 16 min Charges:  OT General Charges $OT Visit: 1 Visit OT Evaluation $OT Eval Moderate Complexity: 1 Mod  01/09/2021  RP, OTR/L  Acute Rehabilitation Services  Office:  [phone]   Metta Clines 01/09/2021, 8:52 AM

## 2021-01-09 NOTE — Progress Notes (Signed)
Patient was transported with her back brace on via wheelchair by volunteer for discharge home; in no acute distress nor complaints of pain nor discomfort; honeycomb dressing on her back incision is clean, dry and intact; room was checked and accounted for all her belongings; discharge instructions concerning medications, wound care, precautions or when to call the doctor were all discussed with the patient and her husband and they both verbalized understanding on the instructions given.

## 2021-01-13 MED FILL — Sodium Chloride IV Soln 0.9%: INTRAVENOUS | Qty: 1000 | Status: AC

## 2021-01-13 MED FILL — Heparin Sodium (Porcine) Inj 1000 Unit/ML: INTRAMUSCULAR | Qty: 30 | Status: AC

## 2021-01-14 ENCOUNTER — Telehealth: Payer: Self-pay | Admitting: *Deleted

## 2021-01-14 DIAGNOSIS — K5900 Constipation, unspecified: Secondary | ICD-10-CM | POA: Diagnosis not present

## 2021-01-14 DIAGNOSIS — Z7982 Long term (current) use of aspirin: Secondary | ICD-10-CM | POA: Diagnosis not present

## 2021-01-14 DIAGNOSIS — M5126 Other intervertebral disc displacement, lumbar region: Secondary | ICD-10-CM | POA: Diagnosis not present

## 2021-01-14 DIAGNOSIS — Z87891 Personal history of nicotine dependence: Secondary | ICD-10-CM | POA: Diagnosis not present

## 2021-01-14 DIAGNOSIS — E785 Hyperlipidemia, unspecified: Secondary | ICD-10-CM | POA: Diagnosis not present

## 2021-01-14 DIAGNOSIS — I10 Essential (primary) hypertension: Secondary | ICD-10-CM | POA: Diagnosis not present

## 2021-01-14 DIAGNOSIS — Z48811 Encounter for surgical aftercare following surgery on the nervous system: Secondary | ICD-10-CM | POA: Diagnosis not present

## 2021-01-14 NOTE — Telephone Encounter (Signed)
Transition Care Management Unsuccessful Follow-up Telephone Call  Date of discharge and from where:  01/09/21  [facility]  Attempts:  1st Attempt  Reason for unsuccessful TCM follow-up call:  No answer/busy  Jacqlyn Larsen [facility], BSN RN Case Manager [phone]

## 2021-01-15 ENCOUNTER — Telehealth: Payer: Self-pay

## 2021-01-15 NOTE — Telephone Encounter (Signed)
Transition Care Management Follow-up Telephone Call Date of discharge and from where: 01/09/21 from [facility] How have you been since you were released from the hospital? "It's much much better now" Any questions or concerns? No  Items Reviewed: Did the pt receive and understand the discharge instructions provided? Yes  Medications obtained and verified? Yes  Other? No  Any new allergies since your discharge? No  Dietary orders reviewed? Yes Do you have support at home? Yes   Home Care and Equipment/Supplies: Were home health services ordered? HH PT If so, what is the name of the agency? [facility]  Has the agency set up a time to come to the patient's home? yes Were any new equipment or medical supplies ordered?  No What is the name of the medical supply agency? Not applicable Were you able to get the supplies/equipment? not applicable Do you have any questions related to the use of the equipment or supplies? No  Functional Questionnaire: (I = Independent and D = Dependent) ADLs: I  Bathing/Dressing- I  Meal Prep- some assistance by husband  Eating- I  Maintaining continence- I  Transferring/Ambulation- I  Managing Meds- I  Follow up appointments reviewed:  PCP Hospital f/u appt confirmed? No  following up with specialist now [facility] f/u appt confirmed? Yes  Scheduled to see Dr. Newman Pies on 02/03/21 @ 2:00pm. Are transportation arrangements needed? Yes  If their condition worsens, is the pt aware to call PCP or go to the Emergency Dept.? Yes Was the patient provided with contact information for the PCP's office or ED? Yes Was to pt encouraged to call back with questions or concerns? Yes   Thea Silversmith, RN, MSN, BSN, CCM [facility] Care Management Coordinator

## 2021-01-16 DIAGNOSIS — Z87891 Personal history of nicotine dependence: Secondary | ICD-10-CM | POA: Diagnosis not present

## 2021-01-16 DIAGNOSIS — K5900 Constipation, unspecified: Secondary | ICD-10-CM | POA: Diagnosis not present

## 2021-01-16 DIAGNOSIS — Z7982 Long term (current) use of aspirin: Secondary | ICD-10-CM | POA: Diagnosis not present

## 2021-01-16 DIAGNOSIS — E785 Hyperlipidemia, unspecified: Secondary | ICD-10-CM | POA: Diagnosis not present

## 2021-01-16 DIAGNOSIS — M5126 Other intervertebral disc displacement, lumbar region: Secondary | ICD-10-CM | POA: Diagnosis not present

## 2021-01-16 DIAGNOSIS — Z48811 Encounter for surgical aftercare following surgery on the nervous system: Secondary | ICD-10-CM | POA: Diagnosis not present

## 2021-01-16 DIAGNOSIS — I10 Essential (primary) hypertension: Secondary | ICD-10-CM | POA: Diagnosis not present

## 2021-01-19 DIAGNOSIS — M5126 Other intervertebral disc displacement, lumbar region: Secondary | ICD-10-CM | POA: Diagnosis not present

## 2021-01-19 DIAGNOSIS — Z87891 Personal history of nicotine dependence: Secondary | ICD-10-CM | POA: Diagnosis not present

## 2021-01-19 DIAGNOSIS — Z7982 Long term (current) use of aspirin: Secondary | ICD-10-CM | POA: Diagnosis not present

## 2021-01-19 DIAGNOSIS — I10 Essential (primary) hypertension: Secondary | ICD-10-CM | POA: Diagnosis not present

## 2021-01-19 DIAGNOSIS — K5900 Constipation, unspecified: Secondary | ICD-10-CM | POA: Diagnosis not present

## 2021-01-19 DIAGNOSIS — E785 Hyperlipidemia, unspecified: Secondary | ICD-10-CM | POA: Diagnosis not present

## 2021-01-19 DIAGNOSIS — Z48811 Encounter for surgical aftercare following surgery on the nervous system: Secondary | ICD-10-CM | POA: Diagnosis not present

## 2021-01-21 DIAGNOSIS — Z7982 Long term (current) use of aspirin: Secondary | ICD-10-CM | POA: Diagnosis not present

## 2021-01-21 DIAGNOSIS — Z48811 Encounter for surgical aftercare following surgery on the nervous system: Secondary | ICD-10-CM | POA: Diagnosis not present

## 2021-01-21 DIAGNOSIS — K5900 Constipation, unspecified: Secondary | ICD-10-CM | POA: Diagnosis not present

## 2021-01-21 DIAGNOSIS — Z87891 Personal history of nicotine dependence: Secondary | ICD-10-CM | POA: Diagnosis not present

## 2021-01-21 DIAGNOSIS — I10 Essential (primary) hypertension: Secondary | ICD-10-CM | POA: Diagnosis not present

## 2021-01-21 DIAGNOSIS — M5126 Other intervertebral disc displacement, lumbar region: Secondary | ICD-10-CM | POA: Diagnosis not present

## 2021-01-21 DIAGNOSIS — E785 Hyperlipidemia, unspecified: Secondary | ICD-10-CM | POA: Diagnosis not present

## 2021-01-30 DIAGNOSIS — E785 Hyperlipidemia, unspecified: Secondary | ICD-10-CM | POA: Diagnosis not present

## 2021-01-30 DIAGNOSIS — K5900 Constipation, unspecified: Secondary | ICD-10-CM | POA: Diagnosis not present

## 2021-01-30 DIAGNOSIS — Z7982 Long term (current) use of aspirin: Secondary | ICD-10-CM | POA: Diagnosis not present

## 2021-01-30 DIAGNOSIS — Z48811 Encounter for surgical aftercare following surgery on the nervous system: Secondary | ICD-10-CM | POA: Diagnosis not present

## 2021-01-30 DIAGNOSIS — Z87891 Personal history of nicotine dependence: Secondary | ICD-10-CM | POA: Diagnosis not present

## 2021-01-30 DIAGNOSIS — I10 Essential (primary) hypertension: Secondary | ICD-10-CM | POA: Diagnosis not present

## 2021-01-30 DIAGNOSIS — M5126 Other intervertebral disc displacement, lumbar region: Secondary | ICD-10-CM | POA: Diagnosis not present

## 2021-02-03 ENCOUNTER — Telehealth: Payer: Self-pay | Admitting: Family Medicine

## 2021-02-03 DIAGNOSIS — M5126 Other intervertebral disc displacement, lumbar region: Secondary | ICD-10-CM | POA: Diagnosis not present

## 2021-02-03 NOTE — Telephone Encounter (Signed)
LVM for pt to rtn my call to schedule AWV with [facility]. Please schedule this appt if pt calls the office.  °

## 2021-02-10 ENCOUNTER — Other Ambulatory Visit: Payer: Self-pay | Admitting: Family Medicine

## 2021-02-10 DIAGNOSIS — F411 Generalized anxiety disorder: Secondary | ICD-10-CM

## 2021-02-16 ENCOUNTER — Ambulatory Visit: Payer: Medicare Other

## 2021-02-24 ENCOUNTER — Ambulatory Visit (INDEPENDENT_AMBULATORY_CARE_PROVIDER_SITE_OTHER): Payer: Medicare Other

## 2021-02-24 VITALS — BP 126/64 | Ht 65.0 in | Wt 156.0 lb

## 2021-02-24 DIAGNOSIS — Z0000 Encounter for general adult medical examination without abnormal findings: Secondary | ICD-10-CM

## 2021-02-24 NOTE — Progress Notes (Signed)
Subjective:   Madison [name] is a 75 y.o. female who presents for Medicare Annual (Subsequent) preventive examination.  Virtual Visit via Telephone Note  I connected with  Madison [name] on 02/24/21 at  2:00 PM EST by telephone and verified that I am speaking with the correct person using two identifiers.  Location: Patient: Home Provider: [facility] Persons participating in the virtual visit: patient/Nurse Health Advisor   I discussed the limitations, risks, security and privacy concerns of performing an evaluation and management service by telephone and the availability of in person appointments. The patient expressed understanding and agreed to proceed.  Interactive audio and video telecommunications were attempted between this nurse and patient, however failed, due to patient having technical difficulties OR patient did not have access to video capability.  We continued and completed visit with audio only.  Some vital signs may be absent or patient reported.   Madison Usrey E Loyce Flaming, LPN   Review of Systems     Cardiac Risk Factors include: advanced age (>45men, >58 women);smoking/ tobacco exposure;dyslipidemia;hypertension;sedentary lifestyle;Other (see comment), Risk factor comments: CAD     Objective:    Today's Vitals   02/24/21 1400  BP: 126/64  Weight: 156 lb (70.8 kg)  Height: 5\' 5"  (1.651 m)   Body mass index is 25.96 kg/m.  Advanced Directives 02/24/2021 01/08/2021 12/26/2020 03/20/2020 03/18/2020 08/22/2014  Does Patient Have a Medical Advance Directive? Yes - Yes Yes Yes Yes  Type of Paramedic of [place];Living will - Healthcare Power of [place] -  Does patient want to make changes to medical advance directive? - - - No - Patient declined No - Guardian declined No - Patient declined  Copy of [place] in Chart? No - copy requested No - copy requested No - copy requested No -  copy requested No - copy requested No - copy requested    Current Medications (verified) Outpatient Encounter Medications as of 02/24/2021  Medication Sig   amLODipine (NORVASC) 5 MG tablet TAKE ONE (1) TABLET EACH DAY   aspirin EC 81 MG tablet Take 1 tablet (81 mg total) by mouth daily. Swallow whole.   busPIRone (BUSPAR) 30 MG tablet Take 1 tablet (30 mg total) by mouth 2 (two) times daily.   conjugated estrogens (PREMARIN) vaginal cream Place 1 Applicatorful vaginally 3 (three) times a week. (Patient taking differently: Place 1 Applicatorful vaginally 3 (three) times a week. .625 mg)   fenofibrate 160 MG tablet Take 1 tablet (160 mg total) by mouth daily.   gabapentin (NEURONTIN) 300 MG capsule Take 300 mg by mouth 3 (three) times daily.   levothyroxine (SYNTHROID) 125 MCG tablet Take 125 mcg by mouth See admin instructions. Take 125 mg every day EXCEPT: Takes 62.5 mg  on sundays   LORazepam (ATIVAN) 1 MG tablet Take 0.5-1 tablets (0.5-1 mg total) by mouth daily as needed for anxiety.   losartan (COZAAR) 100 MG tablet Take 1 tablet (100 mg total) by mouth daily.   Melatonin 5 MG TABS Take 5 mg by mouth at bedtime as needed (sleep).   metoprolol tartrate (LOPRESSOR) 50 MG tablet TAKE ONE (1) TABLET EACH DAY   rosuvastatin (CRESTOR) 40 MG tablet TAKE ONE (1) TABLET EACH DAY   Vitamin D, Ergocalciferol, (DRISDOL) 1.25 MG (50000 UNIT) CAPS capsule Take 1 capsule (50,000 Units total) by mouth every 7 (seven) days. (Patient taking differently: Take 50,000 Units by mouth every 7 (seven) days.  Sunday)   nitroGLYCERIN (NITROSTAT) 0.4 MG SL tablet Place 1 tablet (0.4 mg total) under the tongue every 5 (five) minutes as needed for chest pain.   [DISCONTINUED] docusate sodium (COLACE) 100 MG capsule Take 1 capsule (100 mg total) by mouth 2 (two) times daily. (Patient not taking: Reported on 02/24/2021)   [DISCONTINUED] oxyCODONE-acetaminophen (PERCOCET/ROXICET) 5-325 MG tablet Take 1-2 tablets by mouth  every 4 (four) hours as needed for moderate pain. (Patient not taking: Reported on 02/24/2021)   No facility-administered encounter medications on file as of 02/24/2021.    Allergies (verified) Banana, Bupropion hcl, Chantix [varenicline], Latex, and Zetia [ezetimibe]   History: Past Medical History:  Diagnosis Date   Anxiety    CAD (coronary artery disease)    PCI of an occluded right coronary artery 2001. 70% LAD stenosis, 30% circumflex stenosis.   Diverticulosis    Hyperlipidemia    Hypertension    IBS (irritable bowel syndrome)    Leukocytosis, unspecified 07/24/2012   Myocardial infarction [facility]) 2001   Need for prophylactic hormone replacement therapy (postmenopausal)    Osteopenia    Osteoporosis    PONV (postoperative nausea and vomiting)    Thyroid cancer ([place]) 2004   Dr. Clovia Cuff    Thyroid nodule 2004   Tobacco abuse    Past Surgical History:  Procedure Laterality Date   Ball SURGERY  06/2000   CARDIAC CATHETERIZATION  2001   CHOLECYSTECTOMY  07/2002   EYE SURGERY Bilateral 2019   cataract   LIPOMA EXCISION Right 08/2003   r arm    LUMBAR LAMINECTOMY/DECOMPRESSION MICRODISCECTOMY Left 03/20/2020   Procedure: LEFT LUMBAR FOUR-FIVE MICRODISCECTOMY;  Surgeon: Newman Pies, MD;  Location: [place];  Service: Neurosurgery;  Laterality: Left;   peserie     THYROIDECTOMY Bilateral 02/2003   Family History  Problem Relation Age of Onset   Coronary artery disease Father    Heart disease Father    Cirrhosis Mother    Alcohol abuse Mother    Cancer Other        family history    Hypertension Maternal Grandmother    Social History   Socioeconomic History   Marital status: Married    Spouse name: Not on file   Number of children: 2   Years of education: Not on file   Highest education level: Not on file  Occupational History   Occupation: Retired     Comment: Engineering geologist  Tobacco Use   Smoking status: Every Day    Packs/day: 1.00     Years: 40.00    Pack years: 40.00    Types: Cigarettes   Smokeless tobacco: Never  Vaping Use   Vaping Use: Never used  Substance and Sexual Activity   Alcohol use: No   Drug use: No   Sexual activity: Not Currently  Other Topics Concern   Not on file  Social History Narrative   Dr.  Cyril Loosen -(surgeon) [phone]   Dr. Marilynne Halsted- Endocrinologist  [facility]    Social Determinants of Health   Financial Resource Strain: Low Risk    Difficulty of Paying Living Expenses: Not hard at all  Food Insecurity: No Food Insecurity   Worried About Charity fundraiser in the Last Year: Never true   [place] in the Last Year: Never true  Transportation Needs: No Transportation Needs   Lack of Transportation (Medical): No   Lack of Transportation (Non-Medical): No  Physical Activity: Sufficiently Active  Days of Exercise per Week: 5 days   Minutes of Exercise per Session: 30 min  Stress: No Stress Concern Present   Feeling of Stress : Not at all  Social Connections: Moderately Isolated   Frequency of Communication with Friends and Family: More than three times a week   Frequency of Social Gatherings with Friends and Family: More than three times a week   Attends Religious Services: Never   Marine scientist or Organizations: No   Attends Music therapist: Never   Marital Status: Married    Tobacco Counseling Ready to quit: Not Answered Counseling given: Not Answered   Clinical Intake:  Pre-visit preparation completed: Yes  Pain : No/denies pain     BMI - recorded: 25.96 Nutritional Status: BMI 25 -29 Overweight Nutritional Risks: None Diabetes: No  How often do you need to have someone help you when you read instructions, pamphlets, or other written materials from your doctor or pharmacy?: 1 - Never  Diabetic? No  Interpreter Needed?: No  Information entered by :: Madison Vanloan, LPN   Activities of Daily Living In your present state of health, do  you have any difficulty performing the following activities: 02/24/2021 12/26/2020  Hearing? N N  Vision? N N  Difficulty concentrating or making decisions? N N  Walking or climbing stairs? N Y  Dressing or bathing? N N  Doing errands, shopping? N N  Preparing Food and eating ? N -  Using the Toilet? N -  In the past six months, have you accidently leaked urine? N -  Do you have problems with loss of bowel control? N -  Managing your Medications? N -  Managing your Finances? N -  Housekeeping or managing your Housekeeping? N -  Some recent data might be hidden    Patient Care Team: [name], DO as PCP - General (Family Medicine) Minus Breeding, MD as PCP - Cardiology (Cardiology) Lonna Duval, MD (Internal Medicine) Everardo All, MD (Hematology and Oncology) Minus Breeding, MD (Cardiology) Lafayette Dragon, MD (Inactive) as Consulting Physician (Gastroenterology) Judieth Keens, MD as Referring Physician (Endocrinology) Newman Pies, MD as Consulting Physician (Neurosurgery) Caryl Asp, NP (Urology)  Indicate any recent Medical Services you may have received from other than [facility] providers in the past year (date may be approximate).     Assessment:   This is a routine wellness examination for Madison [name].  Hearing/Vision screen Hearing Screening - Comments:: Denies hearing difficulties  Vision Screening - Comments:: Up to date with annual eye exams with Dr [facility] - wears reading glasses prn  Dietary issues and exercise activities discussed: Current Exercise Habits: Home exercise routine, Type of exercise: walking, Time (Minutes): 30, Frequency (Times/Week): 3, Weekly Exercise (Minutes/Week): 90, Intensity: Mild, Exercise limited by: orthopedic condition(s)   Goals Addressed             This Visit's Progress    Exercise 3x per week (30 min per time)       Quit Smoking       Cut back 2 at a time; every week or so until you quit  completely       Depression Screen PHQ 2/9 Scores 02/24/2021 12/29/2020 06/27/2020 01/08/2020 12/28/2019 07/02/2019 12/13/2018  PHQ - 2 Score 0 0 0 0 1 1 0  PHQ- 9 Score - - 0 - 7 - -    Fall Risk Fall Risk  02/24/2021 12/29/2020 01/08/2020 12/28/2019 07/02/2019  Falls in the past  year? 0 0 0 0 0  Number falls in past yr: 0 - - 0 -  Injury with Fall? 0 - - 0 -  Risk for fall due to : Orthopedic patient;Medication side effect - - No Fall Risks -  Follow up Falls prevention discussed - Falls evaluation completed Falls evaluation completed -    FALL RISK PREVENTION PERTAINING TO THE HOME:  Any stairs in or around the home? No  If so, are there any without handrails? No  Home free of loose throw rugs in walkways, pet beds, electrical cords, etc? Yes  Adequate lighting in your home to reduce risk of falls? Yes   ASSISTIVE DEVICES UTILIZED TO PREVENT FALLS:  Life alert? No  Use of a cane, walker or w/c? No  Grab bars in the bathroom? Yes  Shower chair or bench in shower? Yes  Elevated toilet seat or a handicapped toilet? Yes   TIMED UP AND GO:  Was the test performed? No . Telephonic visit  Cognitive Function: MMSE - Mini Mental State Exam 08/22/2014  Orientation to time 5  Orientation to Place 5  Registration 3  Attention/ Calculation 5  Recall 1  Language- name 2 objects 2  Language- repeat 1  Language- follow 3 step command 3  Language- read & follow direction 1  Write a sentence 1  Copy design 1  Total score 28     6CIT Screen 02/24/2021  What Year? 0 points  What month? 0 points  What time? 0 points  Count back from 20 0 points  Months in reverse 0 points  Repeat phrase 0 points  Total Score 0    Immunizations Immunization History  Administered Date(s) Administered   Fluad Quad(high Dose 65+) 01/29/2019, 01/21/2020   Influenza, High Dose Seasonal PF 03/19/2015, 02/02/2016, 01/25/2017, 02/02/2018   Influenza,inj,Quad PF,6+ Mos 01/25/2013, 01/31/2014    PFIZER(Purple Top)SARS-COV-2 Vaccination 05/08/2019, 05/27/2019, 02/08/2020, 07/28/2020   Pneumococcal Conjugate-13 05/15/2013   Pneumococcal Polysaccharide-23 02/02/2012   Td 06/27/2020   Tdap 05/26/2007   Zoster, Live 11/26/2009    TDAP status: Up to date  Flu Vaccine status: Up to date  Pneumococcal vaccine status: Up to date  Covid-19 vaccine status: Completed vaccines  Qualifies for Shingles Vaccine? Yes   Zostavax completed Yes   Shingrix Completed?: No.    Education has been provided regarding the importance of this vaccine. Patient has been advised to call insurance company to determine out of pocket expense if they have not yet received this vaccine. Advised may also receive vaccine at local pharmacy or Health Dept. Verbalized acceptance and understanding.  Screening Tests Health Maintenance  Topic Date Due   Zoster Vaccines- Shingrix (1 of 2) Never done   COVID-19 Vaccine (5 - Booster for Pfizer series) 09/22/2020   INFLUENZA VACCINE  07/17/2021 (Originally 11/17/2020)   COLONOSCOPY (Pts 45-60yrs Insurance coverage will need to be confirmed)  02/24/2022 (Originally 12/18/2017)   DEXA SCAN  07/08/2021   MAMMOGRAM  06/18/2022   TETANUS/TDAP  06/28/2030   Pneumonia Vaccine 69+ Years old  Completed   Hepatitis C Screening  Completed   HPV VACCINES  Aged Out    Health Maintenance  Health Maintenance Due  Topic Date Due   Zoster Vaccines- Shingrix (1 of 2) Never done   COVID-19 Vaccine (5 - Booster for Pfizer series) 09/22/2020    Colorectal cancer screening: Type of screening: Colonoscopy. Completed 2009. Repeat every 10 years patient declines at this time  Mammogram status: Completed 06/17/2020. Repeat  every year  Bone Density status: Completed 07/09/2019. Results reflect: Bone density results: OSTEOPENIA. Repeat every 2 years.  Lung Cancer Screening: (Low Dose CT Chest recommended if Age 96-80 years, 30 pack-year currently smoking OR have quit w/in 15years.) does  qualify.   Lung Cancer Screening Referral: declines  Additional Screening:  Hepatitis C Screening: does qualify; Completed 07/02/2019  Vision Screening: Recommended annual ophthalmology exams for early detection of glaucoma and other disorders of the eye. Is the patient up to date with their annual eye exam?  Yes  Who is the provider or what is the name of the office in which the patient attends annual eye exams? Eulas Post at [facility] If pt is not established with a provider, would they like to be referred to a provider to establish care? No .   Dental Screening: Recommended annual dental exams for proper oral hygiene  Community Resource Referral / Chronic Care Management: CRR required this visit?  No   CCM required this visit?  No      Plan:     I have personally reviewed and noted the following in the patient's chart:   Medical and social history Use of alcohol, tobacco or illicit drugs  Current medications and supplements including opioid prescriptions.  Functional ability and status Nutritional status Physical activity Advanced directives List of other physicians Hospitalizations, surgeries, and ER visits in previous 12 months Vitals Screenings to include cognitive, depression, and falls Referrals and appointments  In addition, I have reviewed and discussed with patient certain preventive protocols, quality metrics, and best practice recommendations. A written personalized care plan for preventive services as well as general preventive health recommendations were provided to patient.     Sandrea Hammond, LPN   21/06/863   Nurse Notes: I think she qualifies for a low dose chest CT, but she declines. Also declines Colonoscopy. May consider Shingrix at next visit, but hesitant as she had a bad reaction to Zostavax.

## 2021-02-24 NOTE — Patient Instructions (Signed)
Madison [name] , Thank you for taking time to come for your Medicare Wellness Visit. I appreciate your ongoing commitment to your health goals. Please review the following plan we discussed and let me know if I can assist you in the future.   Screening recommendations/referrals: Colonoscopy: Done in 2009 - Recommended repeat every 10 years - Declined Mammogram: done 06/17/2020 - Repeat annually  Bone Density: Done 07/09/2019 - Repeat every 2 years  Recommended yearly ophthalmology/optometry visit for glaucoma screening and checkup Recommended yearly dental visit for hygiene and checkup  Vaccinations: Influenza vaccine: Done 01/26/2021 -Repeat annually  Pneumococcal vaccine: Done 02/02/2012 & 05/15/2014 Tdap vaccine: Done 06/27/2020 - Repeat in 10 years Shingles vaccine: Zostavax done 2011 - adverse reaction - due for Shingrix - Discuss with Dr Lajuana Ripple at next visit   Covid-19:Done 05/08/2019, 05/27/2019, 02/08/2020, & 07/28/2020  Advanced directives: Please bring a copy of your health care power of attorney and living will to the office to be added to your chart at your convenience.   Conditions/risks identified: Aim for 30 minutes of exercise or brisk walking each day, drink 6-8 glasses of water and eat lots of fruits and vegetables.   Next appointment: Follow up in one year for your annual wellness visit    Preventive Care 65 Years and Older, Female Preventive care refers to lifestyle choices and visits with your health care provider that can promote health and wellness. What does preventive care include? A yearly physical exam. This is also called an annual well check. Dental exams once or twice a year. Routine eye exams. Ask your health care provider how often you should have your eyes checked. Personal lifestyle choices, including: Daily care of your teeth and gums. Regular physical activity. Eating a healthy diet. Avoiding tobacco and drug use. Limiting alcohol use. Practicing safe  sex. Taking low-dose aspirin every day. Taking vitamin and mineral supplements as recommended by your health care provider. What happens during an annual well check? The services and screenings done by your health care provider during your annual well check will depend on your age, overall health, lifestyle risk factors, and family history of disease. Counseling  Your health care provider may ask you questions about your: Alcohol use. Tobacco use. Drug use. Emotional well-being. Home and relationship well-being. Sexual activity. Eating habits. History of falls. Memory and ability to understand (cognition). Work and work Statistician. Reproductive health. Screening  You may have the following tests or measurements: Height, weight, and BMI. Blood pressure. Lipid and cholesterol levels. These may be checked every 5 years, or more frequently if you are over 69 years old. Skin check. Lung cancer screening. You may have this screening every year starting at age 38 if you have a 30-pack-year history of smoking and currently smoke or have quit within the past 15 years. Fecal occult blood test (FOBT) of the stool. You may have this test every year starting at age 32. Flexible sigmoidoscopy or colonoscopy. You may have a sigmoidoscopy every 5 years or a colonoscopy every 10 years starting at age 58. Hepatitis C blood test. Hepatitis B blood test. Sexually transmitted disease (STD) testing. Diabetes screening. This is done by checking your blood sugar (glucose) after you have not eaten for a while (fasting). You may have this done every 1-3 years. Bone density scan. This is done to screen for osteoporosis. You may have this done starting at age 28. Mammogram. This may be done every 1-2 years. Talk to your health care provider about how  often you should have regular mammograms. Talk with your health care provider about your test results, treatment options, and if necessary, the need for more  tests. Vaccines  Your health care provider may recommend certain vaccines, such as: Influenza vaccine. This is recommended every year. Tetanus, diphtheria, and acellular pertussis (Tdap, Td) vaccine. You may need a Td booster every 10 years. Zoster vaccine. You may need this after age 56. Pneumococcal 13-valent conjugate (PCV13) vaccine. One dose is recommended after age 40. Pneumococcal polysaccharide (PPSV23) vaccine. One dose is recommended after age 77. Talk to your health care provider about which screenings and vaccines you need and how often you need them. This information is not intended to replace advice given to you by your health care provider. Make sure you discuss any questions you have with your health care provider. Document Released: 05/02/2015 Document Revised: 12/24/2015 Document Reviewed: 02/04/2015 Elsevier Interactive Patient Education  2017 [employer] Prevention in the Home Falls can cause injuries. They can happen to people of all ages. There are many things you can do to make your home safe and to help prevent falls. What can I do on the outside of my home? Regularly fix the edges of walkways and driveways and fix any cracks. Remove anything that might make you trip as you walk through a door, such as a raised step or threshold. Trim any bushes or trees on the path to your home. Use bright outdoor lighting. Clear any walking paths of anything that might make someone trip, such as rocks or tools. Regularly check to see if handrails are loose or broken. Make sure that both sides of any steps have handrails. Any raised decks and porches should have guardrails on the edges. Have any leaves, snow, or ice cleared regularly. Use sand or salt on walking paths during winter. Clean up any spills in your garage right away. This includes oil or grease spills. What can I do in the bathroom? Use night lights. Install grab bars by the toilet and in the tub and shower.  Do not use towel bars as grab bars. Use non-skid mats or decals in the tub or shower. If you need to sit down in the shower, use a plastic, non-slip stool. Keep the floor dry. Clean up any water that spills on the floor as soon as it happens. Remove soap buildup in the tub or shower regularly. Attach bath mats securely with double-sided non-slip rug tape. Do not have throw rugs and other things on the floor that can make you trip. What can I do in the bedroom? Use night lights. Make sure that you have a light by your bed that is easy to reach. Do not use any sheets or blankets that are too big for your bed. They should not hang down onto the floor. Have a firm chair that has side arms. You can use this for support while you get dressed. Do not have throw rugs and other things on the floor that can make you trip. What can I do in the kitchen? Clean up any spills right away. Avoid walking on wet floors. Keep items that you use a lot in easy-to-reach places. If you need to reach something above you, use a strong step stool that has a grab bar. Keep electrical cords out of the way. Do not use floor polish or wax that makes floors slippery. If you must use wax, use non-skid floor wax. Do not have throw rugs and other things  on the floor that can make you trip. What can I do with my stairs? Do not leave any items on the stairs. Make sure that there are handrails on both sides of the stairs and use them. Fix handrails that are broken or loose. Make sure that handrails are as long as the stairways. Check any carpeting to make sure that it is firmly attached to the stairs. Fix any carpet that is loose or worn. Avoid having throw rugs at the top or bottom of the stairs. If you do have throw rugs, attach them to the floor with carpet tape. Make sure that you have a light switch at the top of the stairs and the bottom of the stairs. If you do not have them, ask someone to add them for you. What else  can I do to help prevent falls? Wear shoes that: Do not have high heels. Have rubber bottoms. Are comfortable and fit you well. Are closed at the toe. Do not wear sandals. If you use a stepladder: Make sure that it is fully opened. Do not climb a closed stepladder. Make sure that both sides of the stepladder are locked into place. Ask someone to hold it for you, if possible. Clearly mark and make sure that you can see: Any grab bars or handrails. First and last steps. Where the edge of each step is. Use tools that help you move around (mobility aids) if they are needed. These include: Canes. Walkers. Scooters. Crutches. Turn on the lights when you go into a dark area. Replace any light bulbs as soon as they burn out. Set up your furniture so you have a clear path. Avoid moving your furniture around. If any of your floors are uneven, fix them. If there are any pets around you, be aware of where they are. Review your medicines with your doctor. Some medicines can make you feel dizzy. This can increase your chance of falling. Ask your doctor what other things that you can do to help prevent falls. This information is not intended to replace advice given to you by your health care provider. Make sure you discuss any questions you have with your health care provider. Document Released: 01/30/2009 Document Revised: 09/11/2015 Document Reviewed: 05/10/2014 Elsevier Interactive Patient Education  2017 [employer].

## 2021-04-20 NOTE — Progress Notes (Signed)
Cardiology Office Note   Date:  04/22/2021   ID:  Madison [name], Madison [name] 1945-07-01, MRN [id number]  PCP:  Madison Norlander, DO  Cardiologist:   Minus Breeding, MD   Chief Complaint  Patient presents with   Coronary Artery Disease      History of Present Illness: Madison [name] is a 76 y.o. female who for followup of CAD.  She had a negative Lexiscan Myoview and normal echo in 2018.  Since I last saw her she had lumbar spine surgery.  She unfortunately still has back pain and is somewhat limited in her activities because of this.  Her husband had a very large heart attack and has had a tough time with this by her description.  She herself denies any cardiovascular symptoms. The patient denies any new symptoms such as chest discomfort, neck or arm discomfort. There has been no new shortness of breath, PND or orthopnea. There have been no reported palpitations, presyncope or syncope.    Past Medical History:  Diagnosis Date   Anxiety    CAD (coronary artery disease)    PCI of an occluded right coronary artery 2001. 70% LAD stenosis, 30% circumflex stenosis.   Diverticulosis    Hyperlipidemia    Hypertension    IBS (irritable bowel syndrome)    Leukocytosis, unspecified 07/24/2012   Myocardial infarction [facility]) 2001   Need for prophylactic hormone replacement therapy (postmenopausal)    Osteopenia    Osteoporosis    PONV (postoperative nausea and vomiting)    Thyroid cancer ([place]) 2004   Dr. Clovia [name]    Thyroid nodule 2004   Tobacco abuse     Past Surgical History:  Procedure Laterality Date   Madison [name] SURGERY  06/2000   CARDIAC CATHETERIZATION  2001   CHOLECYSTECTOMY  07/2002   EYE SURGERY Bilateral 2019   cataract   LIPOMA EXCISION Right 08/2003   r arm    LUMBAR LAMINECTOMY/DECOMPRESSION MICRODISCECTOMY Left 03/20/2020   Procedure: LEFT LUMBAR FOUR-FIVE MICRODISCECTOMY;  Surgeon: Madison Pies, MD;  Location: [place];  Service: Neurosurgery;   Laterality: Left;   peserie     THYROIDECTOMY Bilateral 02/2003     Current Outpatient Medications  Medication Sig Dispense Refill   amLODipine (NORVASC) 5 MG tablet TAKE ONE (1) TABLET EACH DAY 90 tablet 3   aspirin EC 81 MG tablet Take 1 tablet (81 mg total) by mouth daily. Swallow whole. 30 tablet 11   busPIRone (BUSPAR) 30 MG tablet Take 1 tablet (30 mg total) by mouth 2 (two) times daily. 180 tablet 3   conjugated estrogens (PREMARIN) vaginal cream Place 1 Applicatorful vaginally 3 (three) times a week. (Patient taking differently: Place 1 Applicatorful vaginally 3 (three) times a week. .625 mg) 30 g prn   fenofibrate 160 MG tablet Take 1 tablet (160 mg total) by mouth daily. 90 tablet 3   gabapentin (NEURONTIN) 300 MG capsule Take 300 mg by mouth 3 (three) times daily.     levothyroxine (SYNTHROID) 125 MCG tablet Take 125 mcg by mouth See admin instructions. Take 125 mg every day EXCEPT: Takes 62.5 mg  on sundays     LORazepam (ATIVAN) 1 MG tablet Take 0.5-1 tablets (0.5-1 mg total) by mouth daily as needed for anxiety. 30 tablet 1   losartan (COZAAR) 100 MG tablet Take 1 tablet (100 mg total) by mouth daily. 90 tablet 3   Melatonin 5 MG TABS Take 5 mg by mouth at bedtime  as needed (sleep).     metoprolol tartrate (LOPRESSOR) 50 MG tablet TAKE ONE (1) TABLET EACH DAY 90 tablet 3   nitroGLYCERIN (NITROSTAT) 0.4 MG SL tablet Place 1 tablet (0.4 mg total) under the tongue every 5 (five) minutes as needed for chest pain. 25 tablet 3   rosuvastatin (CRESTOR) 40 MG tablet TAKE ONE (1) TABLET EACH DAY 90 tablet 3   Vitamin D, Ergocalciferol, (DRISDOL) 1.25 MG (50000 UNIT) CAPS capsule Take 1 capsule (50,000 Units total) by mouth every 7 (seven) days. (Patient taking differently: Take 50,000 Units by mouth every 7 (seven) days. Sunday) 12 capsule 3   No current facility-administered medications for this visit.    Allergies:   Banana, Bupropion hcl, Chantix [varenicline], Latex, and Zetia  [ezetimibe]    ROS:  Please see the history of present illness.   Otherwise, review of systems are positive for none.   All other systems are reviewed and negative.    PHYSICAL EXAM: VS:  BP (!) 180/88    Pulse 70    Ht 5\' 5"  (1.651 m)    Wt 157 lb (71.2 kg)    SpO2 95%    BMI 26.13 kg/m  , BMI Body mass index is 26.13 kg/m. GENERAL:  Well appearing NECK:  No jugular venous distention, waveform within normal limits, carotid upstroke brisk and symmetric, no bruits, no thyromegaly LUNGS:  Clear to auscultation bilaterally CHEST:  Unremarkable HEART:  PMI not displaced or sustained,S1 and S2 within normal limits, no S3, no S4, no clicks, no rubs, 2 out of 6 brief apical systolic murmur radiating slightly at the aortic outflow tract, no diastolic murmurs ABD:  Flat, positive bowel sounds normal in frequency in pitch, no bruits, no rebound, no guarding, no midline pulsatile mass, no hepatomegaly, no splenomegaly EXT:  2 plus pulses throughout, no edema, no cyanosis no clubbing   EKG:  EKG is  ordered today. The ekg ordered today demonstrates sinus rhythm, rate 70, right bundle branch block, no change from previous, no acute ST-T wave changes.   Recent Labs: 12/29/2020: ALT 13; TSH 1.070 01/09/2021: BUN 15; Creatinine, Ser 1.12; Hemoglobin 13.8; Platelets 312; Potassium 3.9; Sodium 138    Lipid Panel    Component Value Date/Time   CHOL 114 06/27/2020 0937   CHOL 107 08/30/2012 0921   TRIG 152 (H) 06/27/2020 0937   TRIG 147 05/18/2016 1209   TRIG 165 (H) 08/30/2012 0921   HDL 24 (L) 06/27/2020 0937   HDL 22 (L) 05/18/2016 1209   HDL 32 (L) 08/30/2012 0921   CHOLHDL 4.8 (H) 06/27/2020 0937   LDLCALC 63 06/27/2020 0937   LDLCALC 69 01/17/2014 0850   LDLCALC 42 08/30/2012 0921      Wt Readings from Last 3 Encounters:  04/22/21 157 lb (71.2 kg)  02/24/21 156 lb (70.8 kg)  01/08/21 163 lb (73.9 kg)      Other studies Reviewed: Additional studies/ records that were reviewed  today include: Labs. Review of the above records demonstrates:  Please see elsewhere in the note.     ASSESSMENT AND PLAN:   CORONARY ARTERY DISEASE -  The patient has no new sypmtoms.  No further cardiovascular testing is indicated.  We will continue with aggressive risk reduction and meds as listed.   RBBB:      This is chronic.    TOBACCO ABUSE -  She is unable to quit smoking we talked about this at length in the past.   DYSLIPIDEMIA -  LDL is 63.  No change in therapy.   HTN - The blood pressure is elevated.  However, at home she takes it routinely about 3 times a week and it is in the 224-825 systolic range with normal diastolics.  No change in therapy.   Current medicines are reviewed at length with the patient today.  The patient does not have concerns regarding medicines.  The following changes have been made:   None  Labs/ tests ordered today include: None  Orders Placed This Encounter  Procedures   EKG 12-Lead     Disposition:   FU with me in one year.     Signed, Minus Breeding, MD  04/22/2021 1:19 PM    [facility]

## 2021-04-21 DIAGNOSIS — I10 Essential (primary) hypertension: Secondary | ICD-10-CM | POA: Diagnosis not present

## 2021-04-21 DIAGNOSIS — M5126 Other intervertebral disc displacement, lumbar region: Secondary | ICD-10-CM | POA: Diagnosis not present

## 2021-04-21 DIAGNOSIS — Z981 Arthrodesis status: Secondary | ICD-10-CM | POA: Diagnosis not present

## 2021-04-22 ENCOUNTER — Other Ambulatory Visit: Payer: Self-pay

## 2021-04-22 ENCOUNTER — Ambulatory Visit (INDEPENDENT_AMBULATORY_CARE_PROVIDER_SITE_OTHER): Payer: Medicare Other | Admitting: Cardiology

## 2021-04-22 ENCOUNTER — Encounter: Payer: Self-pay | Admitting: Cardiology

## 2021-04-22 VITALS — BP 180/88 | HR 70 | Ht 65.0 in | Wt 157.0 lb

## 2021-04-22 DIAGNOSIS — I4510 Unspecified right bundle-branch block: Secondary | ICD-10-CM | POA: Diagnosis not present

## 2021-04-22 DIAGNOSIS — I10 Essential (primary) hypertension: Secondary | ICD-10-CM

## 2021-04-22 DIAGNOSIS — E785 Hyperlipidemia, unspecified: Secondary | ICD-10-CM

## 2021-04-22 DIAGNOSIS — I2510 Atherosclerotic heart disease of native coronary artery without angina pectoris: Secondary | ICD-10-CM

## 2021-04-22 DIAGNOSIS — Z720 Tobacco use: Secondary | ICD-10-CM

## 2021-04-22 NOTE — Patient Instructions (Signed)
Medication Instructions:  The current medical regimen is effective;  continue present plan and medications.  *If you need a refill on your cardiac medications before your next appointment, please call your pharmacy*  Follow-Up: At [facility], you and your health needs are our priority.  As part of our continuing mission to provide you with exceptional heart care, we have created designated Provider Care Teams.  These Care Teams include your primary Cardiologist (physician) and Advanced Practice Providers (APPs -  Physician Assistants and Nurse Practitioners) who all work together to provide you with the care you need, when you need it.  We recommend signing up for the patient portal called "MyChart".  Sign up information is provided on this After Visit Summary.  MyChart is used to connect with patients for Virtual Visits (Telemedicine).  Patients are able to view lab/test results, encounter notes, upcoming appointments, etc.  Non-urgent messages can be sent to your provider as well.   To learn more about what you can do with MyChart, go to [email or web address].    Your next appointment:   1 year(s)  The format for your next appointment:   In Person  Provider:   James Hochrein, MD   Thank you for choosing [facility]!!    

## 2021-05-04 ENCOUNTER — Encounter: Payer: Self-pay | Admitting: Nurse Practitioner

## 2021-05-04 ENCOUNTER — Ambulatory Visit (INDEPENDENT_AMBULATORY_CARE_PROVIDER_SITE_OTHER): Payer: Medicare Other | Admitting: Nurse Practitioner

## 2021-05-04 VITALS — BP 125/75 | HR 69 | Temp 97.6°F | Ht 65.0 in | Wt 158.5 lb

## 2021-05-04 DIAGNOSIS — R051 Acute cough: Secondary | ICD-10-CM

## 2021-05-04 DIAGNOSIS — J069 Acute upper respiratory infection, unspecified: Secondary | ICD-10-CM

## 2021-05-04 LAB — VERITOR FLU A/B WAIVED
Influenza A: NEGATIVE
Influenza B: NEGATIVE

## 2021-05-04 LAB — CULTURE, GROUP A STREP

## 2021-05-04 LAB — RAPID STREP SCREEN (MED CTR MEBANE ONLY): Strep Gp A Ag, IA W/Reflex: NEGATIVE

## 2021-05-04 MED ORDER — GUAIFENESIN ER 600 MG PO TB12: 600.0000 mg | ORAL_TABLET | Freq: Two times a day (BID) | ORAL | 0 refills | Status: DC

## 2021-05-04 MED ORDER — AMOXICILLIN-POT CLAVULANATE 875-125 MG PO TABS: 1.0000 | ORAL_TABLET | Freq: Two times a day (BID) | ORAL | 0 refills | Status: DC

## 2021-05-04 NOTE — Progress Notes (Signed)
Acute Office Visit  Subjective:    Patient ID: Madison [name], female    DOB: [birth year], 76 y.o.   MRN: [id number]  Chief Complaint  Patient presents with   Facial Pain   Cough   Sore Throat    Cough This is a new problem. Episode onset: in the past 5 days. The problem has been unchanged. The problem occurs constantly. The cough is Productive of sputum. Associated symptoms include headaches, nasal congestion, postnasal drip and a sore throat. Pertinent negatives include no chills, ear congestion, ear pain, fever or rash.  Sore Throat  This is a new problem. Episode onset: in the past 5 days. The problem has been unchanged. There has been no fever. The pain is moderate. Associated symptoms include congestion, coughing and headaches. Pertinent negatives include no ear pain. She has had no exposure to strep. She has tried nothing for the symptoms.    Past Medical History:  Diagnosis Date   Anxiety    CAD (coronary artery disease)    PCI of an occluded right coronary artery 2001. 70% LAD stenosis, 30% circumflex stenosis.   Diverticulosis    Hyperlipidemia    Hypertension    IBS (irritable bowel syndrome)    Leukocytosis, unspecified 07/24/2012   Myocardial infarction [facility]) 2001   Need for prophylactic hormone replacement therapy (postmenopausal)    Osteopenia    Osteoporosis    PONV (postoperative nausea and vomiting)    Thyroid cancer ([place]) 2004   Dr. Clovia Cuff    Thyroid nodule 2004   Tobacco abuse     Past Surgical History:  Procedure Laterality Date   [place] SURGERY  06/2000   CARDIAC CATHETERIZATION  2001   CHOLECYSTECTOMY  07/2002   EYE SURGERY Bilateral 2019   cataract   LIPOMA EXCISION Right 08/2003   r arm    LUMBAR LAMINECTOMY/DECOMPRESSION MICRODISCECTOMY Left 03/20/2020   Procedure: LEFT LUMBAR FOUR-FIVE MICRODISCECTOMY;  Surgeon: Newman Pies, MD;  Location: [facility];  Service: Neurosurgery;  Laterality: Left;   peserie     THYROIDECTOMY  Bilateral 02/2003    Family History  Problem Relation Age of Onset   Coronary artery disease Father    Heart disease Father    Cirrhosis Mother    Alcohol abuse Mother    Cancer Other        family history    Hypertension Maternal Grandmother     Social History   Socioeconomic History   Marital status: Married    Spouse name: Not on file   Number of children: 2   Years of education: Not on file   Highest education level: Not on file  Occupational History   Occupation: Retired     Comment: Engineering geologist  Tobacco Use   Smoking status: Every Day    Packs/day: 1.00    Years: 40.00    Pack years: 40.00    Types: Cigarettes   Smokeless tobacco: Never  Vaping Use   Vaping Use: Never used  Substance and Sexual Activity   Alcohol use: No   Drug use: No   Sexual activity: Not Currently  Other Topics Concern   Not on file  Social History Narrative   Dr.  Cyril Loosen -(surgeon) [phone]   Dr. Marilynne Halsted- Endocrinologist  [facility]    Social Determinants of Health   Financial Resource Strain: Low Risk    Difficulty of Paying Living Expenses: Not hard at all  Food Insecurity: No Food Insecurity  Worried About Charity fundraiser in the Last Year: Never true   [place] in the Last Year: Never true  Transportation Needs: No Transportation Needs   Lack of Transportation (Medical): No   Lack of Transportation (Non-Medical): No  Physical Activity: Sufficiently Active   Days of Exercise per Week: 5 days   Minutes of Exercise per Session: 30 min  Stress: No Stress Concern Present   Feeling of Stress : Not at all  Social Connections: Moderately Isolated   Frequency of Communication with Friends and Family: More than three times a week   Frequency of Social Gatherings with Friends and Family: More than three times a week   Attends Religious Services: Never   Marine scientist or Organizations: No   Attends Music therapist: Never   Marital Status:  Married  Human resources officer Violence: Not At Risk   Fear of Current or Ex-Partner: No   Emotionally Abused: No   Physically Abused: No   Sexually Abused: No    Outpatient Medications Prior to Visit  Medication Sig Dispense Refill   amLODipine (NORVASC) 5 MG tablet TAKE ONE (1) TABLET EACH DAY 90 tablet 3   aspirin EC 81 MG tablet Take 1 tablet (81 mg total) by mouth daily. Swallow whole. 30 tablet 11   busPIRone (BUSPAR) 30 MG tablet Take 1 tablet (30 mg total) by mouth 2 (two) times daily. 180 tablet 3   conjugated estrogens (PREMARIN) vaginal cream Place 1 Applicatorful vaginally 3 (three) times a week. (Patient taking differently: Place 1 Applicatorful vaginally 3 (three) times a week. .625 mg) 30 g prn   fenofibrate 160 MG tablet Take 1 tablet (160 mg total) by mouth daily. 90 tablet 3   gabapentin (NEURONTIN) 300 MG capsule Take 300 mg by mouth 3 (three) times daily.     levothyroxine (SYNTHROID) 125 MCG tablet Take 125 mcg by mouth See admin instructions. Take 125 mg every day EXCEPT: Takes 62.5 mg  on sundays     LORazepam (ATIVAN) 1 MG tablet Take 0.5-1 tablets (0.5-1 mg total) by mouth daily as needed for anxiety. 30 tablet 1   losartan (COZAAR) 100 MG tablet Take 1 tablet (100 mg total) by mouth daily. 90 tablet 3   Melatonin 5 MG TABS Take 5 mg by mouth at bedtime as needed (sleep).     metoprolol tartrate (LOPRESSOR) 50 MG tablet TAKE ONE (1) TABLET EACH DAY 90 tablet 3   rosuvastatin (CRESTOR) 40 MG tablet TAKE ONE (1) TABLET EACH DAY 90 tablet 3   Vitamin D, Ergocalciferol, (DRISDOL) 1.25 MG (50000 UNIT) CAPS capsule Take 1 capsule (50,000 Units total) by mouth every 7 (seven) days. (Patient taking differently: Take 50,000 Units by mouth every 7 (seven) days. Sunday) 12 capsule 3   nitroGLYCERIN (NITROSTAT) 0.4 MG SL tablet Place 1 tablet (0.4 mg total) under the tongue every 5 (five) minutes as needed for chest pain. (Patient not taking: Reported on 05/04/2021) 25 tablet 3   No  facility-administered medications prior to visit.    Allergies  Allergen Reactions   Banana Itching and Swelling    Throat and ears itch,ears feel like they swell   Bupropion Hcl     REACTION: paranoia   Chantix [Varenicline]     paranoia   Latex Rash   Zetia [Ezetimibe]     Myalgia    Review of Systems  Constitutional:  Positive for fatigue. Negative for chills and fever.  HENT:  Positive for congestion, postnasal drip, sinus pressure, sinus pain and sore throat. Negative for ear pain.   Eyes: Negative.   Respiratory:  Positive for cough.   Gastrointestinal: Negative.  Negative for nausea.  Skin: Negative.  Negative for rash.  Neurological:  Positive for headaches.  All other systems reviewed and are negative.     Objective:    Physical Exam Vitals and nursing note reviewed.  Constitutional:      Appearance: Normal appearance. She is well-developed.  HENT:     Head: Normocephalic.     Right Ear: Ear canal and external ear normal.     Left Ear: Ear canal and external ear normal.     Nose: Congestion present.     Mouth/Throat:     Mouth: Mucous membranes are moist.     Pharynx: Oropharynx is clear.  Eyes:     Conjunctiva/sclera: Conjunctivae normal.  Cardiovascular:     Rate and Rhythm: Normal rate and regular rhythm.     Pulses: Normal pulses.     Heart sounds: Normal heart sounds.  Pulmonary:     Effort: Pulmonary effort is normal.     Breath sounds: Normal breath sounds.  Abdominal:     General: Bowel sounds are normal.  Musculoskeletal:        General: Normal range of motion.  Skin:    General: Skin is warm.     Findings: No rash.  Neurological:     Mental Status: She is alert and oriented to person, place, and time.  Psychiatric:        Behavior: Behavior normal.    BP 125/75 Comment: at home reading   Pulse 69    Temp 97.6 F (36.4 C) (Temporal)    Ht _0  (1.651 m)    Wt 158 lb 8 oz (71.9 kg)    SpO2 96%    BMI 26.38 kg/m  Wt Readings from Last  3 Encounters:  05/04/21 158 lb 8 oz (71.9 kg)  04/22/21 157 lb (71.2 kg)  02/24/21 156 lb (70.8 kg)    Health Maintenance Due  Topic Date Due   Zoster Vaccines- Shingrix (1 of 2) Never done   COVID-19 Vaccine (5 - Booster for Pfizer series) 09/22/2020    There are no preventive care reminders to display for this patient.   Lab Results  Component Value Date   TSH 1.070 12/29/2020   Lab Results  Component Value Date   WBC 20.2 (H) 01/09/2021   HGB 13.8 01/09/2021   HCT 43.4 01/09/2021   MCV 96.0 01/09/2021   PLT 312 01/09/2021   Lab Results  Component Value Date   NA 138 01/09/2021   K 3.9 01/09/2021   CO2 28 01/09/2021   GLUCOSE 110 (H) 01/09/2021   BUN 15 01/09/2021   CREATININE 1.12 (H) 01/09/2021   BILITOT 0.4 12/29/2020   ALKPHOS 68 12/29/2020   AST 14 12/29/2020   ALT 13 12/29/2020   PROT 6.9 12/29/2020   ALBUMIN 4.5 12/29/2020   CALCIUM 9.0 01/09/2021   ANIONGAP 8 01/09/2021   EGFR 86 12/29/2020   Lab Results  Component Value Date   CHOL 114 06/27/2020   Lab Results  Component Value Date   HDL 24 (L) 06/27/2020   Lab Results  Component Value Date   LDLCALC 63 06/27/2020   Lab Results  Component Value Date   TRIG 152 (H) 06/27/2020   Lab Results  Component Value Date   CHOLHDL 4.8 (H) 06/27/2020  Lab Results  Component Value Date   HGBA1C 5.7 01/17/2014       Assessment & Plan:  Take meds as prescribed - Use a cool mist humidifier  -Use saline nose sprays frequently -Force fluids -For fever or aches or pains- take Tylenol or ibuprofen. -Covid-19 swab, flu swab completed results pending -Augmentin 875-125 1 tablet 2x daily -Muccinex 600 mg for cough Follow up with worsening unresolved symptoms    Problem List Items Addressed This Visit   None Visit Diagnoses     Upper respiratory tract infection, unspecified type    -  Primary   Relevant Medications   amoxicillin-clavulanate (AUGMENTIN) 875-125 MG tablet   Other Relevant  Orders   Novel Coronavirus, NAA ([facility])   Veritor Flu A/B Waived   Rapid Strep Screen ([facility] ONLY)   Acute cough       Relevant Medications   guaiFENesin (MUCINEX) 600 MG 12 hr tablet        Meds ordered this encounter  Medications   amoxicillin-clavulanate (AUGMENTIN) 875-125 MG tablet    Sig: Take 1 tablet by mouth 2 (two) times daily.    Dispense:  14 tablet    Refill:  0    Order Specific Question:   Supervising Provider    Answer:   Claretta Fraise [[id number]]   guaiFENesin (MUCINEX) 600 MG 12 hr tablet    Sig: Take 1 tablet (600 mg total) by mouth 2 (two) times daily.    Dispense:  30 tablet    Refill:  0    Order Specific Question:   Supervising Provider    Answer:   Claretta Fraise [[id number]]     Ivy Lynn, NP

## 2021-05-04 NOTE — Patient Instructions (Addendum)
Sore Throat When you have a sore throat, your throat may feel: Tender. Burning. Irritated. Scratchy. Painful when you swallow. Painful when you talk. Many things can cause a sore throat, such as: An infection. Allergies. Dry air. Smoke or pollution. Radiation treatment for cancer. Gastroesophageal reflux disease (GERD). A tumor. A sore throat can be the first sign of another sickness. It can happen with other problems, like: Coughing. Sneezing. Fever. Swelling of the glands in the neck. Most sore throats go away without treatment. Follow these instructions at home:   Medicines Take over-the-counter and prescription medicines only as told by your doctor. Children often get sore throats. Do not give your child aspirin. Use throat sprays to soothe your throat as told by your health care provider. Managing pain To help with pain: Sip warm liquids, such as broth, herbal tea, or warm water. Eat or drink cold or frozen liquids, such as frozen ice pops. Rinse your mouth (gargle) with a salt water mixture 3-4 times a day or as needed. To make salt water, dissolve -1 tsp (3-6 g) of salt in 1 cup (237 mL) of warm water. Do not swallow this mixture. Suck on hard candy or throat lozenges. Put a cool-mist humidifier in your bedroom at night. Sit in the bathroom with the door closed for 5-10 minutes while you run hot water in the shower. General instructions Do not smoke or use any products that contain nicotine or tobacco. If you need help quitting, ask your doctor. Get plenty of rest. Drink enough fluid to keep your pee (urine) pale yellow. Wash your hands often for at least 20 seconds with soap and water. If soap and water are not available, use hand sanitizer. Contact a doctor if: You have a fever for more than 2-3 days. You keep having symptoms for more than 2-3 days. Your throat does not get better in 7 days. You have a fever and your symptoms suddenly get worse. Your child  who is 3 months to 16 years old has a temperature of 102.86F (39C) or higher. Get help right away if: You have trouble breathing. You cannot swallow fluids, soft foods, or your spit. You have swelling in your throat or neck that gets worse. You feel like you may vomit (nauseous) and this feeling lasts a long time. You cannot stop vomiting. These symptoms may be an emergency. Get help right away. Call your local emergency services (911 in the [place] Do not wait to see if the symptoms will go away. Do not drive yourself to the hospital. Summary A sore throat is a painful, burning, irritated, or scratchy throat. Many things can cause a sore throat. Take over-the-counter medicines only as told by your doctor. Get plenty of rest. Drink enough fluid to keep your pee (urine) pale yellow. Contact a doctor if your symptoms get worse or your sore throat does not get better within 7 days. This information is not intended to replace advice given to you by your health care provider. Make sure you discuss any questions you have with your health care provider. Document Revised: 07/02/2020 Document Reviewed: 07/02/2020 Elsevier Patient Education  [place]. Sinusitis, Adult Sinusitis is soreness and swelling (inflammation) of your sinuses. Sinuses are hollow spaces in the bones around your face. They are located: Around your eyes. In the middle of your forehead. Behind your nose. In your cheekbones. Your sinuses and nasal passages are lined with a fluid called mucus. Mucus drains out of your sinuses. Swelling can trap  mucus in your sinuses. This lets germs (bacteria, virus, or fungus) grow, which leads to infection. Most of the time, this condition is caused by a virus. What are the causes? This condition is caused by: Allergies. Asthma. Germs. Things that block your nose or sinuses. Growths in the nose (nasal polyps). Chemicals or irritants in the air. Fungus (rare). What increases the  risk? You are more likely to develop this condition if: You have a weak body defense system (immune system). You do a lot of swimming or diving. You use nasal sprays too much. You smoke. What are the signs or symptoms? The main symptoms of this condition are pain and a feeling of pressure around the sinuses. Other symptoms include: Stuffy nose (congestion). Runny nose (drainage). Swelling and warmth in the sinuses. Headache. Toothache. A cough that may get worse at night. Mucus that collects in the throat or the back of the nose (postnasal drip). Being unable to smell and taste. Being very tired (fatigue). A fever. Sore throat. Bad breath. How is this diagnosed? This condition is diagnosed based on: Your symptoms. Your medical history. A physical exam. Tests to find out if your condition is short-term (acute) or long-term (chronic). Your doctor may: Check your nose for growths (polyps). Check your sinuses using a tool that has a light (endoscope). Check for allergies or germs. Do imaging tests, such as an MRI or CT scan. How is this treated? Treatment for this condition depends on the cause and whether it is short-term or long-term. If caused by a virus, your symptoms should go away on their own within 10 days. You may be given medicines to relieve symptoms. They include: Medicines that shrink swollen tissue in the nose. Medicines that treat allergies (antihistamines). A spray that treats swelling of the nostrils.  Rinses that help get rid of thick mucus in your nose (nasal saline washes). If caused by bacteria, your doctor may wait to see if you will get better without treatment. You may be given antibiotic medicine if you have: A very bad infection. A weak body defense system. If caused by growths in the nose, you may need to have surgery. Follow these instructions at home: Medicines Take, use, or apply over-the-counter and prescription medicines only as told by your  doctor. These may include nasal sprays. If you were prescribed an antibiotic medicine, take it as told by your doctor. Do not stop taking the antibiotic even if you start to feel better. Hydrate and humidify  Drink enough water to keep your pee (urine) pale yellow. Use a cool mist humidifier to keep the humidity level in your home above 50%. Breathe in steam for 10-15 minutes, 3-4 times a day, or as told by your doctor. You can do this in the bathroom while a hot shower is running. Try not to spend time in cool or dry air. Rest Rest as much as you can. Sleep with your head raised (elevated). Make sure you get enough sleep each night. General instructions  Put a warm, moist washcloth on your face 3-4 times a day, or as often as told by your doctor. This will help with discomfort. Wash your hands often with soap and water. If there is no soap and water, use hand sanitizer. Do not smoke. Avoid being around people who are smoking (secondhand smoke). Keep all follow-up visits as told by your doctor. This is important. Contact a doctor if: You have a fever. Your symptoms get worse. Your symptoms do not get  better within 10 days. Get help right away if: You have a very bad headache. You cannot stop throwing up (vomiting). You have very bad pain or swelling around your face or eyes. You have trouble seeing. You feel confused. Your neck is stiff. You have trouble breathing. Summary Sinusitis is swelling of your sinuses. Sinuses are hollow spaces in the bones around your face. This condition is caused by tissues in your nose that become inflamed or swollen. This traps germs. These can lead to infection. If you were prescribed an antibiotic medicine, take it as told by your doctor. Do not stop taking it even if you start to feel better. Keep all follow-up visits as told by your doctor. This is important. This information is not intended to replace advice given to you by your health care  provider. Make sure you discuss any questions you have with your health care provider. Document Revised: 09/05/2017 Document Reviewed: 09/05/2017 Elsevier Patient Education  2022 [employer].

## 2021-05-05 LAB — SARS-COV-2, NAA 2 DAY TAT

## 2021-05-05 LAB — NOVEL CORONAVIRUS, NAA: SARS-CoV-2, NAA: NOT DETECTED

## 2021-05-06 ENCOUNTER — Other Ambulatory Visit: Payer: Self-pay | Admitting: Family Medicine

## 2021-05-06 DIAGNOSIS — Z1231 Encounter for screening mammogram for malignant neoplasm of breast: Secondary | ICD-10-CM

## 2021-05-07 ENCOUNTER — Other Ambulatory Visit: Payer: Self-pay | Admitting: Family Medicine

## 2021-05-14 DIAGNOSIS — C73 Malignant neoplasm of thyroid gland: Secondary | ICD-10-CM | POA: Diagnosis not present

## 2021-05-14 DIAGNOSIS — E890 Postprocedural hypothyroidism: Secondary | ICD-10-CM | POA: Diagnosis not present

## 2021-05-25 ENCOUNTER — Other Ambulatory Visit: Payer: Self-pay | Admitting: Family Medicine

## 2021-05-25 DIAGNOSIS — E559 Vitamin D deficiency, unspecified: Secondary | ICD-10-CM

## 2021-06-18 ENCOUNTER — Ambulatory Visit
Admission: RE | Admit: 2021-06-18 | Discharge: 2021-06-18 | Disposition: A | Discharge status: Home / Self Care | Payer: Medicare Other | Source: Ambulatory Visit | Attending: Family Medicine | Admitting: Family Medicine

## 2021-06-18 DIAGNOSIS — Z1231 Encounter for screening mammogram for malignant neoplasm of breast: Secondary | ICD-10-CM

## 2021-06-29 ENCOUNTER — Ambulatory Visit (INDEPENDENT_AMBULATORY_CARE_PROVIDER_SITE_OTHER): Payer: Medicare Other | Admitting: Family Medicine

## 2021-06-29 ENCOUNTER — Encounter: Payer: Self-pay | Admitting: Family Medicine

## 2021-06-29 VITALS — BP 127/72 | HR 65 | Temp 98.1°F | Ht 65.0 in | Wt 162.5 lb

## 2021-06-29 DIAGNOSIS — E890 Postprocedural hypothyroidism: Secondary | ICD-10-CM

## 2021-06-29 DIAGNOSIS — I2510 Atherosclerotic heart disease of native coronary artery without angina pectoris: Secondary | ICD-10-CM | POA: Diagnosis not present

## 2021-06-29 DIAGNOSIS — M8589 Other specified disorders of bone density and structure, multiple sites: Secondary | ICD-10-CM

## 2021-06-29 DIAGNOSIS — E7800 Pure hypercholesterolemia, unspecified: Secondary | ICD-10-CM | POA: Diagnosis not present

## 2021-06-29 DIAGNOSIS — I10 Essential (primary) hypertension: Secondary | ICD-10-CM

## 2021-06-29 DIAGNOSIS — F411 Generalized anxiety disorder: Secondary | ICD-10-CM

## 2021-06-29 DIAGNOSIS — Z0000 Encounter for general adult medical examination without abnormal findings: Secondary | ICD-10-CM

## 2021-06-29 DIAGNOSIS — Z0001 Encounter for general adult medical examination with abnormal findings: Secondary | ICD-10-CM

## 2021-06-29 LAB — CMP14+EGFR
ALT: 17 IU/L (ref 0–32)
AST: 17 IU/L (ref 0–40)
Albumin/Globulin Ratio: 1.6 (ref 1.2–2.2)
Albumin: 4.4 g/dL (ref 3.7–4.7)
Alkaline Phosphatase: 68 IU/L (ref 44–121)
BUN/Creatinine Ratio: 22 (ref 12–28)
BUN: 22 mg/dL (ref 8–27)
Bilirubin Total: 0.3 mg/dL (ref 0.0–1.2)
CO2: 17 mmol/L — ABNORMAL LOW (ref 20–29)
Calcium: 9.3 mg/dL (ref 8.7–10.3)
Chloride: 107 mmol/L — ABNORMAL HIGH (ref 96–106)
Creatinine, Ser: 0.99 mg/dL (ref 0.57–1.00)
Globulin, Total: 2.7 g/dL (ref 1.5–4.5)
Glucose: 117 mg/dL — ABNORMAL HIGH (ref 70–99)
Potassium: 4.6 mmol/L (ref 3.5–5.2)
Sodium: 140 mmol/L (ref 134–144)
Total Protein: 7.1 g/dL (ref 6.0–8.5)
eGFR: 60 mL/min/{1.73_m2} (ref >59)

## 2021-06-29 LAB — CBC
Hematocrit: 47.8 % — ABNORMAL HIGH (ref 34.0–46.6)
Hemoglobin: 15.5 g/dL (ref 11.1–15.9)
MCH: 30 pg (ref 26.6–33.0)
MCHC: 32.4 g/dL (ref 31.5–35.7)
MCV: 93 fL (ref 79–97)
Platelets: 241 10*3/uL (ref 150–450)
RBC: 5.16 x10E6/uL (ref 3.77–5.28)
RDW: 12.5 % (ref 11.7–15.4)
WBC: 9 10*3/uL (ref 3.4–10.8)

## 2021-06-29 LAB — LIPID PANEL
Chol/HDL Ratio: 5.1 ratio — ABNORMAL HIGH (ref 0.0–4.4)
Cholesterol, Total: 113 mg/dL (ref 100–199)
HDL: 22 mg/dL — ABNORMAL LOW (ref >39)
LDL Chol Calc (NIH): 66 mg/dL (ref 0–99)
Triglycerides: 141 mg/dL (ref 0–149)
VLDL Cholesterol Cal: 25 mg/dL (ref 5–40)

## 2021-06-29 MED ORDER — LORAZEPAM 1 MG PO TABS: 0.5000 mg | ORAL_TABLET | Freq: Every day | ORAL | 1 refills | Status: DC | PRN

## 2021-06-29 NOTE — Progress Notes (Signed)
Madison [name] is a 76 y.o. female presents to office today for annual physical exam examination.    Concerns today include: 1.  No concerns.  Denies any intolerance or concerns with the Ativan.  Uses this extremely sparingly and usually only at 0.25 mg per dose  Occupation: Retired,  Substance use: None Diet: High in fiber, Exercise: Walks some Last eye exam: Up-to-date Last dental exam: Up-to-date Last colonoscopy: Up-to-date Last mammogram: Up-to-date Last pap smear: N/A.  Wears pessary Refills needed today: Ativan Immunizations needed: Immunization History  Administered Date(s) Administered   Fluad Quad(high Dose 65+) 01/29/2019, 01/21/2020   Influenza, High Dose Seasonal PF 03/19/2015, 02/02/2016, 01/25/2017, 02/02/2018   Influenza,inj,Quad PF,6+ Mos 01/25/2013, 01/31/2014   Influenza-Unspecified 01/26/2021   PFIZER(Purple Top)SARS-COV-2 Vaccination 05/08/2019, 05/27/2019, 02/08/2020, 07/28/2020   Pneumococcal Conjugate-13 05/15/2013   Pneumococcal Polysaccharide-23 02/02/2012   Td 06/27/2020   Tdap 05/26/2007   Zoster, Live 11/26/2009     Past Medical History:  Diagnosis Date   Anxiety    CAD (coronary artery disease)    PCI of an occluded right coronary artery 2001. 70% LAD stenosis, 30% circumflex stenosis.   Diverticulosis    Hyperlipidemia    Hypertension    IBS (irritable bowel syndrome)    Leukocytosis, unspecified 07/24/2012   Myocardial infarction [facility]) 2001   Need for prophylactic hormone replacement therapy (postmenopausal)    Osteopenia    Osteoporosis    PONV (postoperative nausea and vomiting)    Thyroid cancer ([place]) 2004   Dr. Clovia Cuff    Thyroid nodule 2004   Tobacco abuse    Social History   Socioeconomic History   Marital status: Married    Spouse name: Not on file   Number of children: 2   Years of education: Not on file   Highest education level: Not on file  Occupational History   Occupation: Retired     Comment: Engineering geologist   Tobacco Use   Smoking status: Every Day    Packs/day: 1.00    Years: 40.00    Pack years: 40.00    Types: Cigarettes   Smokeless tobacco: Never  Vaping Use   Vaping Use: Never used  Substance and Sexual Activity   Alcohol use: No   Drug use: No   Sexual activity: Not Currently  Other Topics Concern   Not on file  Social History Narrative   Dr.  Cyril Loosen -(surgeon) [phone]   Dr. Marilynne Halsted- Endocrinologist  [facility]    Social Determinants of Health   Financial Resource Strain: Low Risk    Difficulty of Paying Living Expenses: Not hard at all  Food Insecurity: No Food Insecurity   Worried About Charity fundraiser in the Last Year: Never true   [place] in the Last Year: Never true  Transportation Needs: No Transportation Needs   Lack of Transportation (Medical): No   Lack of Transportation (Non-Medical): No  Physical Activity: Sufficiently Active   Days of Exercise per Week: 5 days   Minutes of Exercise per Session: 30 min  Stress: No Stress Concern Present   Feeling of Stress : Not at all  Social Connections: Moderately Isolated   Frequency of Communication with Friends and Family: More than three times a week   Frequency of Social Gatherings with Friends and Family: More than three times a week   Attends Religious Services: Never   Marine scientist or Organizations: No   Attends Archivist Meetings: Never  Marital Status: Married  Human resources officer Violence: Not At Risk   Fear of Current or Ex-Partner: No   Emotionally Abused: No   Physically Abused: No   Sexually Abused: No   Past Surgical History:  Procedure Laterality Date   [place] SURGERY  06/2000   CARDIAC CATHETERIZATION  2001   CHOLECYSTECTOMY  07/2002   EYE SURGERY Bilateral 2019   cataract   LIPOMA EXCISION Right 08/2003   r arm    LUMBAR LAMINECTOMY/DECOMPRESSION MICRODISCECTOMY Left 03/20/2020   Procedure: LEFT LUMBAR FOUR-FIVE MICRODISCECTOMY;  Surgeon:  Newman Pies, MD;  Location: [place];  Service: Neurosurgery;  Laterality: Left;   peserie     THYROIDECTOMY Bilateral 02/2003   Family History  Problem Relation Age of Onset   Coronary artery disease Father    Heart disease Father    Cirrhosis Mother    Alcohol abuse Mother    Cancer Other        family history    Hypertension Maternal Grandmother     Current Outpatient Medications:    amLODipine (NORVASC) 5 MG tablet, TAKE ONE (1) TABLET EACH DAY, Disp: 90 tablet, Rfl: 3   aspirin EC 81 MG tablet, Take 1 tablet (81 mg total) by mouth daily. Swallow whole., Disp: 30 tablet, Rfl: 11   busPIRone (BUSPAR) 30 MG tablet, Take 1 tablet (30 mg total) by mouth 2 (two) times daily., Disp: 180 tablet, Rfl: 3   conjugated estrogens (PREMARIN) vaginal cream, Place 1 Applicatorful vaginally 3 (three) times a week. (Patient taking differently: Place 1 Applicatorful vaginally 3 (three) times a week. .625 mg), Disp: 30 g, Rfl: prn   fenofibrate 160 MG tablet, Take 1 tablet (160 mg total) by mouth daily., Disp: 90 tablet, Rfl: 3   gabapentin (NEURONTIN) 300 MG capsule, Take 300 mg by mouth 3 (three) times daily., Disp: , Rfl:    levothyroxine (SYNTHROID) 125 MCG tablet, Take 125 mcg by mouth See admin instructions. Take 125 mg every day EXCEPT: Takes 62.5 mg  on sundays, Disp: , Rfl:    LORazepam (ATIVAN) 1 MG tablet, Take 0.5-1 tablets (0.5-1 mg total) by mouth daily as needed for anxiety., Disp: 30 tablet, Rfl: 1   losartan (COZAAR) 100 MG tablet, Take 1 tablet (100 mg total) by mouth daily., Disp: 90 tablet, Rfl: 3   Melatonin 5 MG TABS, Take 5 mg by mouth at bedtime as needed (sleep)., Disp: , Rfl:    metoprolol tartrate (LOPRESSOR) 50 MG tablet, TAKE ONE (1) TABLET EACH DAY, Disp: 90 tablet, Rfl: 3   rosuvastatin (CRESTOR) 40 MG tablet, TAKE ONE (1) TABLET EACH DAY, Disp: 90 tablet, Rfl: 3   Vitamin D, Ergocalciferol, (DRISDOL) 1.25 MG (50000 UNIT) CAPS capsule, TAKE 1 CAPSULE EVERY 7 DAYS, Disp:  12 capsule, Rfl: 3   nitroGLYCERIN (NITROSTAT) 0.4 MG SL tablet, Place 1 tablet (0.4 mg total) under the tongue every 5 (five) minutes as needed for chest pain. (Patient not taking: Reported on 05/04/2021), Disp: 25 tablet, Rfl: 3  Allergies  Allergen Reactions   Banana Itching and Swelling    Throat and ears itch,ears feel like they swell   Bupropion Hcl     REACTION: paranoia   Chantix [Varenicline]     paranoia   Latex Rash   Zetia [Ezetimibe]     Myalgia     ROS: Review of Systems Pertinent items noted in HPI and remainder of comprehensive ROS otherwise negative.    Physical exam BP  127/72 Comment: at home reading per pt   Pulse 65    Temp 98.1 F (36.7 C) (Temporal)    Ht 5' 5"  (1.651 m)    Wt 162 lb 8 oz (73.7 kg)    BMI 27.04 kg/m  General appearance: alert, cooperative, appears stated age, and no distress Head: Normocephalic, without obvious abnormality, atraumatic Eyes: negative findings: lids and lashes normal, conjunctivae and sclerae normal, and corneas clear Ears: normal TM's and external ear canals both ears Nose: Nares normal. Septum midline. Mucosa normal. No drainage or sinus tenderness. Throat: lips, mucosa, and tongue normal; teeth and gums normal Neck: no adenopathy, no carotid bruit, supple, symmetrical, trachea midline, and thyroid surgically absent . No tenderness/mass/nodules Back: symmetric, no curvature. ROM normal. No CVA tenderness. Lungs: clear to auscultation bilaterally Heart: regular rate and rhythm, S1, S2 normal, no murmur, click, rub or gallop Abdomen: soft, non-tender; bowel sounds normal; no masses,  no organomegaly Extremities: extremities normal, atraumatic, no cyanosis or edema Pulses: 2+ and symmetric Skin:  Seborrheic keratoses noted along the posterior aspect of the right upper extremity.  She has various pigmented nevi noted throughout the upper extremities as well Lymph nodes: Cervical, supraclavicular, and axillary nodes  normal. Neurologic: Grossly normal Psych: Mood stable, speech normal, affect appropriate  Depression screen [facility] 2/9 06/29/2021 02/24/2021 12/29/2020  Decreased Interest 0 0 0  Down, Depressed, Hopeless 0 0 0  PHQ - 2 Score 0 0 0  Altered sleeping 1 - -  Tired, decreased energy 1 - -  Change in appetite 0 - -  Feeling bad or failure about yourself  0 - -  Trouble concentrating 0 - -  Moving slowly or fidgety/restless 0 - -  Suicidal thoughts 0 - -  PHQ-9 Score 2 - -  Difficult doing work/chores Not difficult at all - -  Some recent data might be hidden   GAD 7 : Generalized Anxiety Score 06/29/2021 12/29/2020 06/27/2020 12/28/2019  Nervous, Anxious, on Edge 1 1 1 2   Control/stop worrying 0 1 0 1  Worry too much - different things 0 1 0 3  Trouble relaxing 0 0 0 0  Restless 0 0 0 0  Easily annoyed or irritable 0 0 0 0  Afraid - awful might happen 0 0 0 3  Total GAD 7 Score 1 3 1 9   Anxiety Difficulty Not difficult at all Not difficult at all Not difficult at all Somewhat difficult    Assessment/ Plan: Madison [name] here for annual physical exam.   Annual physical exam  GAD (generalized anxiety disorder) - Plan: LORazepam (ATIVAN) 1 MG tablet  Coronary artery disease involving native coronary artery of native heart without angina pectoris - Plan: CMP14+EGFR, Lipid Panel, CBC  Pure hypercholesterolemia - Plan: CMP14+EGFR, Lipid Panel  Essential hypertension - Plan: CMP14+EGFR  Postoperative hypothyroidism - Plan: CBC  Osteopenia of multiple sites - Plan: CMP14+EGFR, CBC, DG [facility] DEXA, CANCELED: DG [facility] DEXA  Up-to-date on preventative health care.  Declines shingles vaccination.  Deferred pelvic exam today because she has pessary in place.  She will be seeing her OB/GYN later  Ativan renewed.  She uses this is extremely sparingly.  She is up-to-date on controlled substance contract and urine drug screen.  National narcotic database reviewed and there were no red  flags  Fasting labs ordered.  Continue current regimen.  Had good follow-up with her specialist recently about thyroid and those levels were normal.  Has enough thyroid medicine to get her  through the year  DEXA scan ordered which will be due in April  Counseled on healthy lifestyle choices, including diet (rich in fruits, vegetables and lean meats and low in salt and simple carbohydrates) and exercise (at least 30 minutes of moderate physical activity daily).  Patient to follow up in 1 year for annual exam or sooner if needed.  Tamyah Cutbirth M. Lajuana Ripple, DO

## 2021-06-29 NOTE — Patient Instructions (Signed)
You had labs performed today.  You will be contacted with the results of the labs once they are available, usually in the next 3 business days for routine lab work.  If you have an active my chart account, they will be released to your MyChart.  If you prefer to have these labs released to you via telephone, please let us know. ° Preventive Care 65 Years and Older, Female °Preventive care refers to lifestyle choices and visits with your health care provider that can promote health and wellness. Preventive care visits are also called wellness exams. °What can I expect for my preventive care visit? °Counseling °Your health care provider may ask you questions about your: °Medical history, including: °Past medical problems. °Family medical history. °Pregnancy and menstrual history. °History of falls. °Current health, including: °Memory and ability to understand (cognition). °Emotional well-being. °Home life and relationship well-being. °Sexual activity and sexual health. °Lifestyle, including: °Alcohol, nicotine or tobacco, and drug use. °Access to firearms. °Diet, exercise, and sleep habits. °Work and work environment. °Sunscreen use. °Safety issues such as seatbelt and bike helmet use. °Physical exam °Your health care provider will check your: °Height and weight. These may be used to calculate your BMI (body mass index). BMI is a measurement that tells if you are at a healthy weight. °Waist circumference. This measures the distance around your waistline. This measurement also tells if you are at a healthy weight and may help predict your risk of certain diseases, such as type 2 diabetes and high blood pressure. °Heart rate and blood pressure. °Body temperature. °Skin for abnormal spots. °What immunizations do I need? °Vaccines are usually given at various ages, according to a schedule. Your health care provider will recommend vaccines for you based on your age, medical history, and lifestyle or other factors, such as  travel or where you work. °What tests do I need? °Screening °Your health care provider may recommend screening tests for certain conditions. This may include: °Lipid and cholesterol levels. °Hepatitis C test. °Hepatitis B test. °HIV (human immunodeficiency virus) test. °STI (sexually transmitted infection) testing, if you are at risk. °Lung cancer screening. °Colorectal cancer screening. °Diabetes screening. This is done by checking your blood sugar (glucose) after you have not eaten for a while (fasting). °Mammogram. Talk with your health care provider about how often you should have regular mammograms. °BRCA-related cancer screening. This may be done if you have a family history of breast, ovarian, tubal, or peritoneal cancers. °Bone density scan. This is done to screen for osteoporosis. °Talk with your health care provider about your test results, treatment options, and if necessary, the need for more tests. °Follow these instructions at home: °Eating and drinking ° °Eat a diet that includes fresh fruits and vegetables, whole grains, lean protein, and low-fat dairy products. Limit your intake of foods with high amounts of sugar, saturated fats, and salt. °Take vitamin and mineral supplements as recommended by your health care provider. °Do not drink alcohol if your health care provider tells you not to drink. °If you drink alcohol: °Limit how much you have to 0-1 drink a day. °Know how much alcohol is in your drink. In the [place] one drink equals one 12 oz bottle of beer (355 mL), one 5 oz glass of wine (148 mL), or one 1½ oz glass of hard liquor (44 mL). °Lifestyle °Brush your teeth every morning and night with fluoride toothpaste. Floss one time each day. °Exercise for at least 30 minutes 5 or more days each   week. °Do not use any products that contain nicotine or tobacco. These products include cigarettes, chewing tobacco, and vaping devices, such as e-cigarettes. If you need help quitting, ask your health care  provider. °Do not use drugs. °If you are sexually active, practice safe sex. Use a condom or other form of protection in order to prevent STIs. °Take aspirin only as told by your health care provider. Make sure that you understand how much to take and what form to take. Work with your health care provider to find out whether it is safe and beneficial for you to take aspirin daily. °Ask your health care provider if you need to take a cholesterol-lowering medicine (statin). °Find healthy ways to manage stress, such as: °Meditation, yoga, or listening to music. °Journaling. °Talking to a trusted person. °Spending time with friends and family. °Minimize exposure to UV radiation to reduce your risk of skin cancer. °Safety °Always wear your seat belt while driving or riding in a vehicle. °Do not drive: °If you have been drinking alcohol. Do not ride with someone who has been drinking. °When you are tired or distracted. °While texting. °If you have been using any mind-altering substances or drugs. °Wear a helmet and other protective equipment during sports activities. °If you have firearms in your house, make sure you follow all gun safety procedures. °What's next? °Visit your health care provider once a year for an annual wellness visit. °Ask your health care provider how often you should have your eyes and teeth checked. °Stay up to date on all vaccines. °This information is not intended to replace advice given to you by your health care provider. Make sure you discuss any questions you have with your health care provider. °Document Revised: 10/01/2020 Document Reviewed: 10/01/2020 °Elsevier Patient Education © 2022 Elsevier Inc. ° ° °

## 2021-07-20 ENCOUNTER — Ambulatory Visit (INDEPENDENT_AMBULATORY_CARE_PROVIDER_SITE_OTHER): Payer: Medicare Other

## 2021-07-20 DIAGNOSIS — M8589 Other specified disorders of bone density and structure, multiple sites: Secondary | ICD-10-CM | POA: Diagnosis not present

## 2021-07-20 DIAGNOSIS — M810 Age-related osteoporosis without current pathological fracture: Secondary | ICD-10-CM | POA: Diagnosis not present

## 2021-07-31 NOTE — Progress Notes (Signed)
Patient returning call. Please call back

## 2021-08-04 ENCOUNTER — Other Ambulatory Visit: Payer: Self-pay | Admitting: Family Medicine

## 2021-08-05 ENCOUNTER — Encounter: Payer: Self-pay | Admitting: Emergency Medicine

## 2021-08-05 NOTE — Progress Notes (Signed)
Patient calling back about results. Please call back.  ?

## 2021-09-09 ENCOUNTER — Encounter: Payer: Self-pay | Admitting: Family Medicine

## 2021-09-09 ENCOUNTER — Ambulatory Visit (INDEPENDENT_AMBULATORY_CARE_PROVIDER_SITE_OTHER): Payer: Medicare Other | Admitting: Family Medicine

## 2021-09-09 VITALS — BP 156/81 | HR 71 | Temp 98.0°F | Ht 65.0 in | Wt 169.0 lb

## 2021-09-09 DIAGNOSIS — J209 Acute bronchitis, unspecified: Secondary | ICD-10-CM

## 2021-09-09 DIAGNOSIS — J440 Chronic obstructive pulmonary disease with acute lower respiratory infection: Secondary | ICD-10-CM | POA: Diagnosis not present

## 2021-09-09 MED ORDER — PREDNISONE 20 MG PO TABS: ORAL_TABLET | ORAL | 0 refills | Status: DC

## 2021-09-09 MED ORDER — AMOXICILLIN 500 MG PO CAPS: 500.0000 mg | ORAL_CAPSULE | Freq: Two times a day (BID) | ORAL | 0 refills | Status: DC

## 2021-09-09 MED ORDER — ALBUTEROL SULFATE HFA 108 (90 BASE) MCG/ACT IN AERS: 2.0000 | INHALATION_SPRAY | Freq: Four times a day (QID) | RESPIRATORY_TRACT | 0 refills | Status: DC | PRN

## 2021-09-09 NOTE — Progress Notes (Signed)
BP (!) 156/81   Pulse 71   Temp 98 F (36.7 C)   Ht '5\' 5"'$  (1.651 m)   Wt 169 lb (76.7 kg)   SpO2 94%   BMI 28.12 kg/m    Subjective:   Patient ID: Madison [name], female    DOB: 1945-11-23, 76 y.o.   MRN: [id number]  HPI: Madison [name] is a 76 y.o. female presenting on 09/09/2021 for URI (Started last week. Getting worse.)   HPI Patient comes in complaining of congestion and sinus pressure and sneezing and has led to wheezing and coughing over the past little bit.  She says is just not getting better and is getting worse.  She has done some over-the-counter cough medicine and sinus medicines and it does not seem to be improving.  She denies any sick contacts that she knows of.  She is a smoker about half pack per day and has been for quite some time.  She says she gets like this frequently, at least once per year.  She has not been tested in the past for COPD or emphysema.  Relevant past medical, surgical, family and social history reviewed and updated as indicated. Interim medical history since our last visit reviewed. Allergies and medications reviewed and updated.  Review of Systems  Constitutional:  Negative for chills and fever.  HENT:  Positive for congestion, postnasal drip, rhinorrhea, sinus pressure and sneezing. Negative for ear discharge, ear pain and sore throat.   Eyes:  Negative for pain, redness and visual disturbance.  Respiratory:  Positive for cough, shortness of breath and wheezing. Negative for chest tightness.   Cardiovascular:  Negative for chest pain and leg swelling.  Genitourinary:  Negative for difficulty urinating and dysuria.  Musculoskeletal:  Negative for back pain and gait problem.  Skin:  Negative for rash.  Neurological:  Negative for dizziness, light-headedness and headaches.  Psychiatric/Behavioral:  Negative for agitation and behavioral problems.   All other systems reviewed and are negative.  Per HPI unless specifically indicated  above   Allergies as of 09/09/2021       Reactions   Banana Itching, Swelling   Throat and ears itch,ears feel like they swell   Bupropion Hcl    REACTION: paranoia   Chantix [varenicline]    paranoia   Latex Rash   Zetia [ezetimibe]    Myalgia        Medication List        Accurate as of Sep 09, 2021 11:30 AM. If you have any questions, ask your nurse or doctor.          albuterol 108 (90 Base) MCG/ACT inhaler Commonly known as: VENTOLIN HFA Inhale 2 puffs into the lungs every 6 (six) hours as needed for wheezing or shortness of breath. Started by: Fransisca Kaufmann Shaquel Josephson, MD   amLODipine 5 MG tablet Commonly known as: NORVASC TAKE ONE (1) TABLET EACH DAY   amoxicillin 500 MG capsule Commonly known as: AMOXIL Take 1 capsule (500 mg total) by mouth 2 (two) times daily. Started by: Worthy Rancher, MD   aspirin EC 81 MG tablet Take 1 tablet (81 mg total) by mouth daily. Swallow whole.   busPIRone 30 MG tablet Commonly known as: BUSPAR Take 1 tablet (30 mg total) by mouth 2 (two) times daily.   fenofibrate 160 MG tablet Take 1 tablet (160 mg total) by mouth daily.   gabapentin 300 MG capsule Commonly known as: NEURONTIN Take 300 mg by mouth 3 (  three) times daily.   levothyroxine 125 MCG tablet Commonly known as: SYNTHROID Take 125 mcg by mouth See admin instructions. Take 125 mg every day EXCEPT: Takes 62.5 mg  on sundays   LORazepam 1 MG tablet Commonly known as: ATIVAN Take 0.5-1 tablets (0.5-1 mg total) by mouth daily as needed for anxiety.   losartan 100 MG tablet Commonly known as: COZAAR Take 1 tablet (100 mg total) by mouth daily.   melatonin 5 MG Tabs Take 5 mg by mouth at bedtime as needed (sleep).   metoprolol tartrate 50 MG tablet Commonly known as: LOPRESSOR TAKE ONE (1) TABLET EACH DAY   nitroGLYCERIN 0.4 MG SL tablet Commonly known as: Nitrostat Place 1 tablet (0.4 mg total) under the tongue every 5 (five) minutes as needed for  chest pain.   predniSONE 20 MG tablet Commonly known as: DELTASONE 2 po at same time daily for 5 days Started by: Fransisca Kaufmann Elvira Langston, MD   Premarin vaginal cream Generic drug: conjugated estrogens Place 1 Applicatorful vaginally 3 (three) times a week. What changed: additional instructions   rosuvastatin 40 MG tablet Commonly known as: CRESTOR TAKE ONE (1) TABLET EACH DAY   Vitamin D (Ergocalciferol) 1.25 MG (50000 UNIT) Caps capsule Commonly known as: DRISDOL TAKE 1 CAPSULE EVERY 7 DAYS         Objective:   BP (!) 156/81   Pulse 71   Temp 98 F (36.7 C)   Ht '5\' 5"'$  (1.651 m)   Wt 169 lb (76.7 kg)   SpO2 94%   BMI 28.12 kg/m   Wt Readings from Last 3 Encounters:  09/09/21 169 lb (76.7 kg)  06/29/21 162 lb 8 oz (73.7 kg)  05/04/21 158 lb 8 oz (71.9 kg)    Physical Exam Vitals reviewed.  Constitutional:      General: She is not in acute distress.    Appearance: She is well-developed. She is not diaphoretic.  HENT:     Nose: Mucosal edema and rhinorrhea present.     Right Sinus: No maxillary sinus tenderness or frontal sinus tenderness.     Left Sinus: No maxillary sinus tenderness or frontal sinus tenderness.     Mouth/Throat:     Pharynx: Uvula midline. Posterior oropharyngeal erythema present. No oropharyngeal exudate.     Tonsils: No tonsillar abscesses.  Eyes:     Conjunctiva/sclera: Conjunctivae normal.  Cardiovascular:     Rate and Rhythm: Normal rate and regular rhythm.     Heart sounds: Normal heart sounds. No murmur heard. Pulmonary:     Effort: Pulmonary effort is normal. No respiratory distress.     Breath sounds: Wheezing and rhonchi present. No rales.  Chest:     Chest wall: No tenderness.  Musculoskeletal:        General: No tenderness. Normal range of motion.  Skin:    General: Skin is warm and dry.     Findings: No rash.  Neurological:     Mental Status: She is alert and oriented to person, place, and time.     Coordination:  Coordination normal.  Psychiatric:        Behavior: Behavior normal.      Assessment & Plan:   Problem List Items Addressed This Visit   None Visit Diagnoses     Acute bronchitis with COPD ([place])    -  Primary   Relevant Medications   predniSONE (DELTASONE) 20 MG tablet   amoxicillin (AMOXIL) 500 MG capsule   albuterol (VENTOLIN HFA)  108 (90 Base) MCG/ACT inhaler       We will treat like possible COPD exacerbation based on smoking history and recurrent bronchitis.  She may need to get testing to make sure that that is a diagnosis and possible treatment in the future for prevention but for now we will do prednisone amoxicillin and albuterol. Follow up plan: Return if symptoms worsen or fail to improve.  Counseling provided for all of the vaccine components No orders of the defined types were placed in this encounter.   Caryl Pina, MD [facility] 09/09/2021, 11:30 AM

## 2021-10-07 ENCOUNTER — Other Ambulatory Visit: Payer: Self-pay | Admitting: Family Medicine

## 2021-10-07 DIAGNOSIS — E7800 Pure hypercholesterolemia, unspecified: Secondary | ICD-10-CM

## 2021-10-13 ENCOUNTER — Other Ambulatory Visit: Payer: Self-pay | Admitting: Family Medicine

## 2021-10-13 DIAGNOSIS — I10 Essential (primary) hypertension: Secondary | ICD-10-CM

## 2021-10-27 DIAGNOSIS — Z981 Arthrodesis status: Secondary | ICD-10-CM | POA: Diagnosis not present

## 2021-10-27 DIAGNOSIS — M5116 Intervertebral disc disorders with radiculopathy, lumbar region: Secondary | ICD-10-CM | POA: Diagnosis not present

## 2021-10-27 DIAGNOSIS — M5136 Other intervertebral disc degeneration, lumbar region: Secondary | ICD-10-CM | POA: Diagnosis not present

## 2021-10-27 DIAGNOSIS — M4316 Spondylolisthesis, lumbar region: Secondary | ICD-10-CM | POA: Diagnosis not present

## 2021-11-03 DIAGNOSIS — F17200 Nicotine dependence, unspecified, uncomplicated: Secondary | ICD-10-CM | POA: Diagnosis not present

## 2021-11-03 DIAGNOSIS — Z4689 Encounter for fitting and adjustment of other specified devices: Secondary | ICD-10-CM | POA: Diagnosis not present

## 2021-11-03 DIAGNOSIS — R82998 Other abnormal findings in urine: Secondary | ICD-10-CM | POA: Diagnosis not present

## 2021-11-03 DIAGNOSIS — N8111 Cystocele, midline: Secondary | ICD-10-CM | POA: Diagnosis not present

## 2021-11-05 DIAGNOSIS — M4316 Spondylolisthesis, lumbar region: Secondary | ICD-10-CM | POA: Diagnosis not present

## 2021-11-10 DIAGNOSIS — M4316 Spondylolisthesis, lumbar region: Secondary | ICD-10-CM | POA: Diagnosis not present

## 2021-11-13 DIAGNOSIS — M4316 Spondylolisthesis, lumbar region: Secondary | ICD-10-CM | POA: Diagnosis not present

## 2021-11-16 DIAGNOSIS — M4316 Spondylolisthesis, lumbar region: Secondary | ICD-10-CM | POA: Diagnosis not present

## 2021-11-20 DIAGNOSIS — M4316 Spondylolisthesis, lumbar region: Secondary | ICD-10-CM | POA: Diagnosis not present

## 2021-11-23 DIAGNOSIS — M4316 Spondylolisthesis, lumbar region: Secondary | ICD-10-CM | POA: Diagnosis not present

## 2021-11-26 DIAGNOSIS — M4316 Spondylolisthesis, lumbar region: Secondary | ICD-10-CM | POA: Diagnosis not present

## 2021-12-02 DIAGNOSIS — M4316 Spondylolisthesis, lumbar region: Secondary | ICD-10-CM | POA: Diagnosis not present

## 2021-12-09 DIAGNOSIS — M4316 Spondylolisthesis, lumbar region: Secondary | ICD-10-CM | POA: Diagnosis not present

## 2021-12-15 DIAGNOSIS — M4316 Spondylolisthesis, lumbar region: Secondary | ICD-10-CM | POA: Diagnosis not present

## 2021-12-29 ENCOUNTER — Encounter: Payer: Self-pay | Admitting: Family Medicine

## 2021-12-29 ENCOUNTER — Ambulatory Visit (INDEPENDENT_AMBULATORY_CARE_PROVIDER_SITE_OTHER): Payer: Medicare Other | Admitting: Family Medicine

## 2021-12-29 VITALS — BP 118/72 | HR 65 | Temp 97.3°F | Ht 65.0 in | Wt 169.6 lb

## 2021-12-29 DIAGNOSIS — Z79899 Other long term (current) drug therapy: Secondary | ICD-10-CM

## 2021-12-29 DIAGNOSIS — F411 Generalized anxiety disorder: Secondary | ICD-10-CM

## 2021-12-29 DIAGNOSIS — E7800 Pure hypercholesterolemia, unspecified: Secondary | ICD-10-CM

## 2021-12-29 DIAGNOSIS — L820 Inflamed seborrheic keratosis: Secondary | ICD-10-CM | POA: Diagnosis not present

## 2021-12-29 DIAGNOSIS — I10 Essential (primary) hypertension: Secondary | ICD-10-CM

## 2021-12-29 MED ORDER — LOSARTAN POTASSIUM 100 MG PO TABS
100.0000 mg | ORAL_TABLET | Freq: Every day | ORAL | 3 refills | Status: DC
Start: 2021-12-29 — End: 2022-09-21

## 2021-12-29 MED ORDER — METOPROLOL TARTRATE 50 MG PO TABS: ORAL_TABLET | ORAL | 3 refills | Status: DC

## 2021-12-29 MED ORDER — BUSPIRONE HCL 30 MG PO TABS: 30.0000 mg | ORAL_TABLET | Freq: Two times a day (BID) | ORAL | 3 refills | Status: DC

## 2021-12-29 MED ORDER — AMLODIPINE BESYLATE 5 MG PO TABS: ORAL_TABLET | ORAL | 3 refills | Status: DC

## 2021-12-29 MED ORDER — LORAZEPAM 1 MG PO TABS: 0.5000 mg | ORAL_TABLET | Freq: Every day | ORAL | 1 refills | Status: DC | PRN

## 2021-12-29 MED ORDER — ROSUVASTATIN CALCIUM 40 MG PO TABS: ORAL_TABLET | ORAL | 3 refills | Status: DC

## 2021-12-29 MED ORDER — FENOFIBRATE 160 MG PO TABS
160.0000 mg | ORAL_TABLET | Freq: Every day | ORAL | 3 refills | Status: DC
Start: 2021-12-29 — End: 2022-09-27

## 2021-12-29 NOTE — Patient Instructions (Signed)
Cryoablation, Care After This sheet gives you information about how to care for yourself after your procedure. Your health care provider may also give you more specific instructions. If you have problems or questions, contact your health care provider. What can I expect after the procedure? After the procedure, it is common to have: Soreness around the treatment area. Mild pain and swelling in the treatment area. Follow these instructions at home: Treatment area care  If you have an incision, follow instructions from your health care provider about how to take care of it. Make sure you: Wash your hands with soap and water for at least 20 seconds before and after you change your bandage (dressing). If soap and water are not available, use hand sanitizer. Change your dressing as told by your health care provider. Leave stitches (sutures), skin glue, or adhesive strips in place. These skin closures may need to stay in place for 2 weeks or longer. If adhesive strip edges start to loosen and curl up, you may trim the loose edges. Do not remove adhesive strips completely unless your health care provider tells you to do that. Check your treatment area every day for signs of infection. Check for: More redness, swelling, or pain. Fluid or blood. Warmth. Pus or a bad smell. Keep the treated area clean, dry, and covered with a dressing until it has healed. Clean the area with soap and water or as told by your health care provider. If your bandage gets wet, change it right away. Do not take baths, swim, or use a hot tub until your health care provider approves. Ask your health care provider if you may take showers. You may only be allowed to take sponge baths. Activity  Follow instructions from your health care provider about any activity limitations, including lifting heavy objects. If you were given a sedative during the procedure, it can affect you for several hours. Do not drive or operate machinery  until your health care provider says that it is safe. General instructions Take over-the-counter and prescription medicines only as told by your health care provider. Do not use any products that contain nicotine or tobacco, such as cigarettes, e-cigarettes, and chewing tobacco. These can delay incision healing. If you need help quitting, ask your health care provider. Keep all follow-up visits as told by your health care provider. This is important. Contact a health care provider if: You have increased pain. You have a fever. You have nausea or vomiting. You have any of these signs of infection: More redness, swelling, or pain around your treatment area. Fluid or blood coming from your treatment area. Warmth coming from your treatment area. Pus or a bad smell coming from your treatment area. You do not have a bowel movement for 2 days. Get help right away if you have: Severe pain. Trouble swallowing or breathing. Severe weakness or dizziness. Chest pain or shortness of breath. These symptoms may represent a serious problem that is an emergency. Do not wait to see if the symptoms will go away. Get medical help right away. Call your local emergency services (911 in the [place] Do not drive yourself to the hospital. Summary After the procedure, it is common for the treatment area to be sore, mildly painful, and swollen. If you have a dressing, change it as told by your health care provider. Follow instructions from your health care provider about any activity limitations. Contact a health care provider if you have increased pain or a fever. This information is   not intended to replace advice given to you by your health care provider. Make sure you discuss any questions you have with your health care provider. Document Revised: 01/11/2019 Document Reviewed: 01/11/2019 Elsevier Patient Education  2023 Elsevier Inc.  

## 2021-12-29 NOTE — Progress Notes (Signed)
Subjective: CC: Skin lesion PCP: [name], DO VHQ:IONGEXB H Eduardo is a 76 y.o. female presenting to clinic today for:  1.  Skin lesions Patient reports several skin lesions she would like me to look at.  She has 2 on her right upper extremity which become irritated.  No spontaneous bleeding.  She also has 1 on the right lower extremity that is similar.  2.  GAD Anxiety disorder is chronic and stable.  Does not need refills at this time on her buspirone but does need Ativan renewed.  No excessive daytime sedation, falls or respiratory depression reported.  3. HTN w/ HLD Compliant with meds. No chest pain, shortness of breath.  ROS: Per HPI  Allergies  Allergen Reactions   Banana Itching and Swelling    Throat and ears itch,ears feel like they swell   Bupropion Hcl     REACTION: paranoia   Chantix [Varenicline]     paranoia   Latex Rash   Zetia [Ezetimibe]     Myalgia   Past Medical History:  Diagnosis Date   Anxiety    CAD (coronary artery disease)    PCI of an occluded right coronary artery 2001. 70% LAD stenosis, 30% circumflex stenosis.   Diverticulosis    Hyperlipidemia    Hypertension    IBS (irritable bowel syndrome)    Leukocytosis, unspecified 07/24/2012   Myocardial infarction [facility]) 2001   Need for prophylactic hormone replacement therapy (postmenopausal)    Osteopenia    Osteoporosis    PONV (postoperative nausea and vomiting)    Thyroid cancer ([facility]) 2004   Dr. Clovia Cuff    Thyroid nodule 2004   Tobacco abuse     Current Outpatient Medications:    albuterol (VENTOLIN HFA) 108 (90 Base) MCG/ACT inhaler, Inhale 2 puffs into the lungs every 6 (six) hours as needed for wheezing or shortness of breath., Disp: 8 g, Rfl: 0   amLODipine (NORVASC) 5 MG tablet, TAKE ONE (1) TABLET EACH DAY, Disp: 90 tablet, Rfl: 3   aspirin EC 81 MG tablet, Take 1 tablet (81 mg total) by mouth daily. Swallow whole., Disp: 30 tablet, Rfl: 11   busPIRone (BUSPAR) 30 MG  tablet, Take 1 tablet (30 mg total) by mouth 2 (two) times daily., Disp: 180 tablet, Rfl: 3   conjugated estrogens (PREMARIN) vaginal cream, Place 1 Applicatorful vaginally 3 (three) times a week. (Patient taking differently: Place 1 Applicatorful vaginally 3 (three) times a week. .625 mg), Disp: 30 g, Rfl: prn   fenofibrate 160 MG tablet, Take 1 tablet (160 mg total) by mouth daily., Disp: 90 tablet, Rfl: 3   gabapentin (NEURONTIN) 300 MG capsule, Take 300 mg by mouth 3 (three) times daily., Disp: , Rfl:    levothyroxine (SYNTHROID) 125 MCG tablet, Take 125 mcg by mouth See admin instructions. Take 125 mg every day EXCEPT: Takes 62.5 mg  on sundays, Disp: , Rfl:    LORazepam (ATIVAN) 1 MG tablet, Take 0.5-1 tablets (0.5-1 mg total) by mouth daily as needed for anxiety., Disp: 30 tablet, Rfl: 1   losartan (COZAAR) 100 MG tablet, Take 1 tablet (100 mg total) by mouth daily., Disp: 90 tablet, Rfl: 3   Melatonin 5 MG TABS, Take 5 mg by mouth at bedtime as needed (sleep)., Disp: , Rfl:    metoprolol tartrate (LOPRESSOR) 50 MG tablet, TAKE ONE (1) TABLET EACH DAY, Disp: 90 tablet, Rfl: 3   nitroGLYCERIN (NITROSTAT) 0.4 MG SL tablet, Place 1 tablet (0.4 mg total)  under the tongue every 5 (five) minutes as needed for chest pain. (Patient not taking: Reported on 05/04/2021), Disp: 25 tablet, Rfl: 3   predniSONE (DELTASONE) 20 MG tablet, 2 po at same time daily for 5 days, Disp: 10 tablet, Rfl: 0   rosuvastatin (CRESTOR) 40 MG tablet, TAKE ONE (1) TABLET EACH DAY, Disp: 90 tablet, Rfl: 0   Vitamin D, Ergocalciferol, (DRISDOL) 1.25 MG (50000 UNIT) CAPS capsule, TAKE 1 CAPSULE EVERY 7 DAYS, Disp: 12 capsule, Rfl: 3 Social History   Socioeconomic History   Marital status: Married    Spouse name: Not on file   Number of children: 2   Years of education: Not on file   Highest education level: Not on file  Occupational History   Occupation: Retired     Comment: Engineering geologist  Tobacco Use   Smoking status:  Every Day    Packs/day: 1.00    Years: 40.00    Total pack years: 40.00    Types: Cigarettes   Smokeless tobacco: Never  Vaping Use   Vaping Use: Never used  Substance and Sexual Activity   Alcohol use: No   Drug use: No   Sexual activity: Not Currently  Other Topics Concern   Not on file  Social History Narrative   Dr.  Cyril Loosen -(surgeon) [phone]   Dr. Marilynne Halsted- Endocrinologist  [facility]    Social Determinants of Health   Financial Resource Strain: Low Risk  (02/24/2021)   Overall Financial Resource Strain (CARDIA)    Difficulty of Paying Living Expenses: Not hard at all  Food Insecurity: No Food Insecurity (02/24/2021)   Hunger Vital Sign    Worried About Running Out of Food in the Last Year: Never true    Ran Out of Food in the Last Year: Never true  Transportation Needs: No Transportation Needs (02/24/2021)   PRAPARE - Hydrologist (Medical): No    Lack of Transportation (Non-Medical): No  Physical Activity: Sufficiently Active (02/24/2021)   Exercise Vital Sign    Days of Exercise per Week: 5 days    Minutes of Exercise per Session: 30 min  Stress: No Stress Concern Present (02/24/2021)   Bent    Feeling of Stress : Not at all  Social Connections: Moderately Isolated (02/24/2021)   Social Connection and Isolation Panel [NHANES]    Frequency of Communication with Friends and Family: More than three times a week    Frequency of Social Gatherings with Friends and Family: More than three times a week    Attends Religious Services: Never    Marine scientist or Organizations: No    Attends Archivist Meetings: Never    Marital Status: Married  Human resources officer Violence: Not At Risk (02/24/2021)   Humiliation, Afraid, Rape, and Kick questionnaire    Fear of Current or Ex-Partner: No    Emotionally Abused: No    Physically Abused: No    Sexually Abused: No    Family History  Problem Relation Age of Onset   Coronary artery disease Father    Heart disease Father    Cirrhosis Mother    Alcohol abuse Mother    Cancer Other        family history    Hypertension Maternal Grandmother     Objective: Office vital signs reviewed. BP 118/72   Pulse 65   Temp (!) 97.3 F (36.3 C) (Temporal)   Ht 5'  5" (1.651 m)   Wt 169 lb 9.6 oz (76.9 kg)   SpO2 91%   BMI 28.22 kg/m   Physical Examination:  General: Awake, alert, well nourished, No acute distress HEENT: sclera white, MMM Cardio: regular rate and rhythm, S1S2 heard, no murmurs appreciated Pulm: clear to auscultation bilaterally, no wheezes, rhonchi or rales; normal work of breathing on room air Skin: well circumscribed raised scaley lesion on right posterior arm x2. Similar flesh colored lesion on right lower leg all about the size of an eraser head Neuro: No tremor Psych: Mood stable, speech normal, affect appropriate.  Thought process linear.  Cryotherapy Procedure:  Risks and benefits of procedure were reviewed with the patient.  Written consent obtained and scanned into the chart.  Lesion of concern was identified and located on right posterior arm x2.  Liquid nitrogen was applied to area of concern and extending out 1 millimeters beyond the border of the lesion.  Treated area was allowed to come back to room temperature before treating it a second time.  Patient tolerated procedure well and there were no immediate complications.  Home care instructions were reviewed with the patient and a handout was provided.  Cryotherapy Procedure:  Risks and benefits of procedure were reviewed with the patient.  Written consent obtained and scanned into the chart.  Lesion of concern was identified and located on right lower leg.  Liquid nitrogen was applied to area of concern and extending out 1 millimeters beyond the border of the lesion.  Treated area was allowed to come back to room temperature  before treating it a second time.  Patient tolerated procedure well and there were no immediate complications.  Home care instructions were reviewed with the patient and a handout was provided.   Assessment/ Plan: 76 y.o. female   GAD (generalized anxiety disorder) - Plan: ToxASSURE Select 13 (MW), Urine, LORazepam (ATIVAN) 1 MG tablet, busPIRone (BUSPAR) 30 MG tablet  Controlled substance agreement signed - Plan: ToxASSURE Select 13 (MW), Urine  Essential hypertension - Plan: metoprolol tartrate (LOPRESSOR) 50 MG tablet, losartan (COZAAR) 100 MG tablet, amLODipine (NORVASC) 5 MG tablet  Pure hypercholesterolemia - Plan: rosuvastatin (CRESTOR) 40 MG tablet, fenofibrate 160 MG tablet  Seborrheic keratoses, inflamed  Ativan renewed.  Continue use sparingly.  UDS and CSC were updated as per office policy.  National narcotic database reviewed and there were no red flags  Blood pressure is controlled.  Not yet due for fasting lipid.  Continue meds as prescribed.  These have been renewed.  Plan for 10-monthfollow-up with annual physical and fasting labs  Seborrheic keratoses on the upper extremities noted to be inflamed.  These were treated with cryoablation.  Home care instructions were reviewed and a handout was provided  No orders of the defined types were placed in this encounter.  No orders of the defined types were placed in this encounter.    AJanora Norlander DO [phone]

## 2021-12-30 DIAGNOSIS — M4316 Spondylolisthesis, lumbar region: Secondary | ICD-10-CM | POA: Diagnosis not present

## 2021-12-31 LAB — TOXASSURE SELECT 13 (MW), URINE

## 2022-03-18 DIAGNOSIS — H02825 Cysts of left lower eyelid: Secondary | ICD-10-CM | POA: Diagnosis not present

## 2022-03-18 DIAGNOSIS — H35372 Puckering of macula, left eye: Secondary | ICD-10-CM | POA: Diagnosis not present

## 2022-03-18 DIAGNOSIS — H43811 Vitreous degeneration, right eye: Secondary | ICD-10-CM | POA: Diagnosis not present

## 2022-03-18 DIAGNOSIS — Z961 Presence of intraocular lens: Secondary | ICD-10-CM | POA: Diagnosis not present

## 2022-03-18 DIAGNOSIS — H02834 Dermatochalasis of left upper eyelid: Secondary | ICD-10-CM | POA: Diagnosis not present

## 2022-03-26 ENCOUNTER — Ambulatory Visit (INDEPENDENT_AMBULATORY_CARE_PROVIDER_SITE_OTHER): Payer: Medicare Other | Admitting: Family Medicine

## 2022-03-26 ENCOUNTER — Encounter: Payer: Self-pay | Admitting: Family Medicine

## 2022-03-26 VITALS — BP 147/83 | HR 67 | Temp 98.3°F | Ht 65.0 in | Wt 163.2 lb

## 2022-03-26 DIAGNOSIS — J0110 Acute frontal sinusitis, unspecified: Secondary | ICD-10-CM

## 2022-03-26 MED ORDER — FLUTICASONE PROPIONATE 50 MCG/ACT NA SUSP: 2.0000 | Freq: Every day | NASAL | 6 refills | Status: DC

## 2022-03-26 MED ORDER — AZITHROMYCIN 250 MG PO TABS: ORAL_TABLET | ORAL | 0 refills | Status: DC

## 2022-03-26 NOTE — Progress Notes (Signed)
   Acute Office Visit  Subjective:     Patient ID: Madison [name], female    DOB: 26-Jan-1946, 76 y.o.   MRN: [id number]  Chief Complaint  Patient presents with   Nasal Congestion    HPI Patient is in today for nasal congestion for 1 week with postnasal drip. She reports that this has been unchanged. She has had sinus pressure around her eyes with a HA. She also had a slight cough. She has felt nauseous from the postnasal drip. She has been taking mucinex and antihistamine without much help. She has a history of allergies and bronchitis. She is a smoker. Denies hx of COPD or pneumonia.   ROS As per HPI.      Objective:    BP (!) 147/83   Pulse 67   Temp 98.3 F (36.8 C) (Temporal)   Ht '5\' 5"'$  (1.651 m)   Wt 163 lb 4 oz (74 kg)   SpO2 95%   BMI 27.17 kg/m    Physical Exam Vitals and nursing note reviewed.  Constitutional:      General: She is not in acute distress.    Appearance: She is not ill-appearing, toxic-appearing or diaphoretic.  Cardiovascular:     Rate and Rhythm: Normal rate and regular rhythm.     Heart sounds: Normal heart sounds. No murmur heard. Pulmonary:     Effort: Pulmonary effort is normal. No respiratory distress.     Breath sounds: Normal breath sounds. No wheezing, rhonchi or rales.  Musculoskeletal:     Right lower leg: No edema.     Left lower leg: No edema.  Skin:    General: Skin is warm and dry.  Neurological:     General: No focal deficit present.     Mental Status: She is alert and oriented to person, place, and time.  Psychiatric:        Mood and Affect: Mood normal.        Behavior: Behavior normal.        Thought Content: Thought content normal.        Judgment: Judgment normal.     No results found for any visits on 03/26/22.      Assessment & Plan:   Madison [name] was seen today for nasal congestion.  Diagnoses and all orders for this visit:  Acute non-recurrent frontal sinusitis Patient reports that augment cause GI upset  and that zpaks have been helpful in the past. Zpak sent in as well as flonase. Continue mucinex and antihistamine. Return to office for new or worsening symptoms, or if symptoms persist.  -     azithromycin (ZITHROMAX Z-PAK) 250 MG tablet; As directed -     fluticasone (FLONASE) 50 MCG/ACT nasal spray; Place 2 sprays into both nostrils daily.   Return if symptoms worsen or fail to improve.  The patient indicates understanding of these issues and agrees with the plan.  Gwenlyn Perking, FNP

## 2022-03-26 NOTE — Patient Instructions (Signed)

## 2022-03-31 ENCOUNTER — Ambulatory Visit (INDEPENDENT_AMBULATORY_CARE_PROVIDER_SITE_OTHER): Payer: Medicare Other

## 2022-03-31 DIAGNOSIS — Z0000 Encounter for general adult medical examination without abnormal findings: Secondary | ICD-10-CM

## 2022-03-31 NOTE — Patient Instructions (Signed)
  Madison [name] , Thank you for taking time to come for your Medicare Wellness Visit. I appreciate your ongoing commitment to your health goals. Please review the following plan we discussed and let me know if I can assist you in the future.   These are the goals we discussed:  Goals      Exercise 3x per week (30 min per time)     Patient Stated     03/31/2022 AWV Goal: Fall Prevention  Over the next year, patient will decrease their risk for falls by: Using assistive devices, such as a cane or walker, as needed Identifying fall risks within their home and correcting them by: Removing throw rugs Adding handrails to stairs or ramps Removing clutter and keeping a clear pathway throughout the home Increasing light, especially at night Adding shower handles/bars Raising toilet seat Identifying potential personal risk factors for falls: Medication side effects Incontinence/urgency Vestibular dysfunction Hearing loss Musculoskeletal disorders Neurological disorders Orthostatic hypotension       Quit Smoking     Cut back 2 at a time; every week or so until you quit completely        This is a list of the screening recommended for you and due dates:  Health Maintenance  Topic Date Due   Screening for Lung Cancer  08/07/2013   Colon Cancer Screening  12/18/2017   Medicare Annual Wellness Visit  02/24/2022   COVID-19 Vaccine (6 - 2023-24 season) 03/25/2022   Zoster (Shingles) Vaccine (1 of 2) 09/30/2022*   DEXA scan (bone density measurement)  07/21/2023   DTaP/Tdap/Td vaccine (3 - Td or Tdap) 06/28/2030   Pneumonia Vaccine  Completed   Flu Shot  Completed   Hepatitis C Screening: USPSTF Recommendation to screen - Ages 39-79 yo.  Completed   HPV Vaccine  Aged Out  *Topic was postponed. The date shown is not the original due date.

## 2022-03-31 NOTE — Progress Notes (Signed)
Subjective:   Madison [name] is a 76 y.o. female who presents for Medicare Annual (Subsequent) preventive examination.  I connected with  Madison [name] on 03/31/22 by a audio enabled telemedicine application and verified that I am speaking with the correct person using two identifiers.  Patient Location: Home  Provider Location: Office/Clinic  I discussed the limitations of evaluation and management by telemedicine. The patient expressed understanding and agreed to proceed.   Review of Systems    Defer to PCP  Cardiac Risk Factors include: advanced age (>8mn, >>4women);dyslipidemia;hypertension;smoking/ tobacco exposure     Objective:    There were no vitals filed for this visit. There is no height or weight on file to calculate BMI.     03/31/2022    1:24 PM 02/24/2021    2:20 PM 01/08/2021   10:10 AM 12/26/2020    1:09 PM 03/20/2020    6:45 PM 03/18/2020    1:21 PM 08/22/2014    2:27 PM  Advanced Directives  Does Patient Have a Medical Advance Directive? Yes Yes  Yes Yes Yes Yes  Type of AParamed[place]of [place] will  Healthcare Power of [place]  Does patient want to make changes to medical advance directive? No - Patient declined    No - Patient declined No - Guardian declined No - Patient declined  Copy of [place] Chart? No - copy requested No - copy requested No - copy requested No - copy requested No - copy requested No - copy requested No - copy requested    Current Medications (verified) Outpatient Encounter Medications as of 03/31/2022  Medication Sig   amLODipine (NORVASC) 5 MG tablet TAKE ONE (1) TABLET EACH DAY   aspirin EC 81 MG tablet Take 1 tablet (81 mg total) by mouth daily. Swallow whole.   busPIRone (BUSPAR) 30 MG tablet Take 1 tablet (30 mg total) by mouth 2 (two) times daily.   conjugated estrogens (PREMARIN)  vaginal cream Place 1 Applicatorful vaginally 3 (three) times a week. (Patient taking differently: Place 1 Applicatorful vaginally 3 (three) times a week. .625 mg)   fenofibrate 160 MG tablet Take 1 tablet (160 mg total) by mouth daily.   fluticasone (FLONASE) 50 MCG/ACT nasal spray Place 2 sprays into both nostrils daily.   levothyroxine (SYNTHROID) 125 MCG tablet Take 125 mcg by mouth See admin instructions. Take 125 mg every day EXCEPT: Takes 62.5 mg  on sundays   LORazepam (ATIVAN) 1 MG tablet Take 0.5-1 tablets (0.5-1 mg total) by mouth daily as needed for anxiety.   losartan (COZAAR) 100 MG tablet Take 1 tablet (100 mg total) by mouth daily.   Melatonin 5 MG TABS Take 5 mg by mouth at bedtime as needed (sleep).   metoprolol tartrate (LOPRESSOR) 50 MG tablet TAKE ONE (1) TABLET EACH DAY   nitroGLYCERIN (NITROSTAT) 0.4 MG SL tablet Place 1 tablet (0.4 mg total) under the tongue every 5 (five) minutes as needed for chest pain.   rosuvastatin (CRESTOR) 40 MG tablet 1 tablet daily   Vitamin D, Ergocalciferol, (DRISDOL) 1.25 MG (50000 UNIT) CAPS capsule TAKE 1 CAPSULE EVERY 7 DAYS   albuterol (VENTOLIN HFA) 108 (90 Base) MCG/ACT inhaler Inhale 2 puffs into the lungs every 6 (six) hours as needed for wheezing or shortness of breath. (Patient not taking: Reported on 03/26/2022)   [DISCONTINUED] azithromycin (ZITHROMAX Z-PAK) 250 MG tablet  As directed   No facility-administered encounter medications on file as of 03/31/2022.    Allergies (verified) Banana, Bupropion hcl, Chantix [varenicline], Latex, and Zetia [ezetimibe]   History: Past Medical History:  Diagnosis Date   Anxiety    CAD (coronary artery disease)    PCI of an occluded right coronary artery 2001. 70% LAD stenosis, 30% circumflex stenosis.   Diverticulosis    Hyperlipidemia    Hypertension    IBS (irritable bowel syndrome)    Leukocytosis, unspecified 07/24/2012   Myocardial infarction [facility]) 2001   Need for prophylactic  hormone replacement therapy (postmenopausal)    Osteopenia    Osteoporosis    PONV (postoperative nausea and vomiting)    Thyroid cancer ([place]) 2004   Dr. Clovia Cuff    Thyroid nodule 2004   Tobacco abuse    Past Surgical History:  Procedure Laterality Date   [place] SURGERY  06/2000   CARDIAC CATHETERIZATION  2001   CHOLECYSTECTOMY  07/2002   EYE SURGERY Bilateral 2019   cataract   LIPOMA EXCISION Right 08/2003   r arm    LUMBAR LAMINECTOMY/DECOMPRESSION MICRODISCECTOMY Left 03/20/2020   Procedure: LEFT LUMBAR FOUR-FIVE MICRODISCECTOMY;  Surgeon: Newman Pies, MD;  Location: [facility];  Service: Neurosurgery;  Laterality: Left;   peserie     THYROIDECTOMY Bilateral 02/2003   Family History  Problem Relation Age of Onset   Coronary artery disease Father    Heart disease Father    Cirrhosis Mother    Alcohol abuse Mother    Cancer Other        family history    Hypertension Maternal Grandmother    Social History   Socioeconomic History   Marital status: Married    Spouse name: Not on file   Number of children: 2   Years of education: Not on file   Highest education level: Not on file  Occupational History   Occupation: Retired     Comment: Engineering geologist  Tobacco Use   Smoking status: Every Day    Packs/day: 1.00    Years: 40.00    Total pack years: 40.00    Types: Cigarettes   Smokeless tobacco: Never  Vaping Use   Vaping Use: Never used  Substance and Sexual Activity   Alcohol use: No   Drug use: No   Sexual activity: Not Currently  Other Topics Concern   Not on file  Social History Narrative   Dr.  Cyril Loosen -(surgeon) [phone]   Dr. Marilynne Halsted- Endocrinologist  [facility]    Social Determinants of Health   Financial Resource Strain: [place]  (03/31/2022)   Overall Financial Resource Strain (CARDIA)    Difficulty of Paying Living Expenses: Not hard at all  Food Insecurity: No Food Insecurity (03/31/2022)   Hunger Vital Sign    Worried  About [place] in the Last Year: Never true    [place] in the Last Year: Never true  Transportation Needs: No Transportation Needs (03/31/2022)   PRAPARE - Hydrologist (Medical): No    Lack of Transportation (Non-Medical): No  Physical Activity: Insufficiently Active (03/31/2022)   Exercise Vital Sign    Days of Exercise per Week: 3 days    Minutes of Exercise per Session: 30 min  Stress: No Stress Concern Present (03/31/2022)   [place]    Feeling of Stress : Not at all  Social  Connections: Moderately Isolated (03/31/2022)   Social Connection and Isolation Panel [NHANES]    Frequency of Communication with Friends and Family: More than three times a week    Frequency of Social Gatherings with Friends and Family: Once a week    Attends Religious Services: Never    Marine scientist or Organizations: No    Attends Music therapist: Never    Marital Status: Married    Tobacco Counseling Not ready to quit  Clinical Intake:  Pre-visit preparation completed: Yes  Pain : No/denies pain     Nutritional Risks: None Diabetes: No  How often do you need to have someone help you when you read instructions, pamphlets, or other written materials from your doctor or pharmacy?: 1 - Never What is the last grade level you completed in school?: Associates Degree  Diabetic?  No  Interpreter Needed?: No  Information entered by :: Felicity Coyer LPN   Activities of Daily Living    03/31/2022    1:25 PM  In your present state of health, do you have any difficulty performing the following activities:  Hearing? 0  Vision? 0  Difficulty concentrating or making decisions? 0  Walking or climbing stairs? 0  Dressing or bathing? 0  Doing errands, shopping? 0  Preparing Food and eating ? N  Using the Toilet? N  In the past six months, have you accidently leaked  urine? N  Do you have problems with loss of bowel control? N  Managing your Medications? N  Managing your Finances? N  Housekeeping or managing your Housekeeping? N    Patient Care Team: [name], DO as PCP - General (Family Medicine) Minus Breeding, MD as PCP - Cardiology (Cardiology) Lonna Duval, MD (Internal Medicine) Everardo All, MD (Hematology and Oncology) Minus Breeding, MD (Cardiology) Lafayette Dragon, MD (Inactive) as Consulting Physician (Gastroenterology) Judieth Keens, MD as Referring Physician (Endocrinology) Newman Pies, MD as Consulting Physician (Neurosurgery) Caryl Asp, NP (Urology)  Indicate any recent Medical Services you may have received from other than [facility] providers in the past year (date may be approximate).     Assessment:   This is a routine wellness examination for [place].  Hearing/Vision screen No results found.  Dietary issues and exercise activities discussed: Current Exercise Habits: Home exercise routine, Type of exercise: walking, Time (Minutes): 30, Frequency (Times/Week): 3, Weekly Exercise (Minutes/Week): 90, Intensity: Mild, Exercise limited by: None identified   Goals Addressed             This Visit's Progress    Patient Stated       03/31/2022 AWV Goal: Fall Prevention  Over the next year, patient will decrease their risk for falls by: Using assistive devices, such as a cane or walker, as needed Identifying fall risks within their home and correcting them by: Removing throw rugs Adding handrails to stairs or ramps Removing clutter and keeping a clear pathway throughout the home Increasing light, especially at night Adding shower handles/bars Raising toilet seat Identifying potential personal risk factors for falls: Medication side effects Incontinence/urgency Vestibular dysfunction Hearing loss Musculoskeletal disorders Neurological disorders Orthostatic hypotension          Depression Screen    03/31/2022    1:23 PM 03/26/2022   11:32 AM 12/29/2021    9:33 AM 09/09/2021   11:16 AM 06/29/2021    9:11 AM 02/24/2021    2:06 PM 12/29/2020   10:06 AM  PHQ 2/9 Scores  PHQ - 2  Score 0 3 0 0 0 0 0  PHQ- 9 Score  '10 2  2      '$ Fall Risk    03/31/2022    1:27 PM 03/26/2022   11:32 AM 12/29/2021    9:33 AM 09/09/2021   11:16 AM 06/29/2021    9:10 AM  Fall Risk   Falls in the past year? 0 0 0 0 0  Follow up Falls evaluation completed        FALL RISK PREVENTION PERTAINING TO THE HOME:  Any stairs in or around the home? Yes  If so, are there any without handrails? No Home free of loose throw rugs in walkways, pet beds, electrical cords, etc? No  Adequate lighting in your home to reduce risk of falls? Yes   ASSISTIVE DEVICES UTILIZED TO PREVENT FALLS:  Life alert? No  Use of a cane, walker or w/c? No  Grab bars in the bathroom? Yes  Shower chair or bench in shower? No  Elevated toilet seat or a handicapped toilet? Yes   TIMED UP AND GO:  Was the test performed? No .    Cognitive Function:    08/22/2014    2:34 PM  MMSE - Mini Mental State Exam  Orientation to time 5  Orientation to Place 5  Registration 3  Attention/ Calculation 5  Recall 1  Language- name 2 objects 2  Language- repeat 1  Language- follow 3 step command 3  Language- read & follow direction 1  Write a sentence 1  Copy design 1  Total score 28        03/31/2022    1:26 PM 02/24/2021    2:08 PM  6CIT Screen  What Year? 0 points 0 points  What month? 0 points 0 points  What time? 0 points 0 points  Count back from 20 0 points 0 points  Months in reverse 0 points 0 points  Repeat phrase 0 points 0 points  Total Score 0 points 0 points    Immunizations Immunization History  Administered Date(s) Administered   Fluad Quad(high Dose 65+) 01/29/2019, 01/21/2020, 02/01/2022   Influenza, High Dose Seasonal PF 03/19/2015, 02/02/2016, 01/25/2017, 02/02/2018    Influenza,inj,Quad PF,6+ Mos 01/25/2013, 01/31/2014   Influenza-Unspecified 01/26/2021   PFIZER(Purple Top)SARS-COV-2 Vaccination 05/08/2019, 05/27/2019, 02/08/2020, 07/28/2020, 01/28/2022   Pneumococcal Conjugate-13 05/15/2013   Pneumococcal Polysaccharide-23 02/02/2012   Respiratory Syncytial Virus Vaccine,Recomb Aduvanted(Arexvy) 01/11/2022   Td 06/27/2020   Tdap 05/26/2007   Zoster, Live 11/26/2009    TDAP status: Up to date  Flu Vaccine status: Up to date  Pneumococcal vaccine status: Due, Education has been provided regarding the importance of this vaccine. Advised may receive this vaccine at local pharmacy or Health Dept. Aware to provide a copy of the vaccination record if obtained from local pharmacy or Health Dept. Verbalized acceptance and understanding.  Covid-19 vaccine status: Completed vaccines  Qualifies for Shingles Vaccine? Yes   Zostavax completed Yes   Shingrix Completed?: No.    Education has been provided regarding the importance of this vaccine. Patient has been advised to call insurance company to determine out of pocket expense if they have not yet received this vaccine. Advised may also receive vaccine at local pharmacy or Health Dept. Verbalized acceptance and understanding.  Screening Tests Health Maintenance  Topic Date Due   Lung Cancer Screening  08/07/2013   COLONOSCOPY (Pts 45-87yr Insurance coverage will need to be confirmed)  12/18/2017   Medicare Annual Wellness (AWV)  02/24/2022  COVID-19 Vaccine (6 - 2023-24 season) 03/25/2022   Zoster Vaccines- Shingrix (1 of 2) 09/30/2022 (Originally 09/30/1964)   DEXA SCAN  07/21/2023   DTaP/Tdap/Td (3 - Td or Tdap) 06/28/2030   Pneumonia Vaccine 74+ Years old  Completed   INFLUENZA VACCINE  Completed   Hepatitis C Screening  Completed   HPV VACCINES  Aged Out    Health Maintenance  Health Maintenance Due  Topic Date Due   Lung Cancer Screening  08/07/2013   COLONOSCOPY (Pts 45-45yr Insurance  coverage will need to be confirmed)  12/18/2017   Medicare Annual Wellness (AWV)  02/24/2022   COVID-19 Vaccine (6 - 2023-24 season) 03/25/2022    Colonoscopy was completed several years ago.  Patient does not wish to have another one and declines referral to GI.  Mammogram status: Completed 06/18/2021. Repeat every year  Bone Density status: Completed 07/30/2021. Results reflect: Bone density results: OSTEOPOROSIS. Repeat every two years.  Lung Cancer Screening: (Low Dose CT Chest recommended if Age 75-80years, 30 pack-year currently smoking OR have quit w/in 15years.) does qualify.   Lung Cancer Screening Referral: Needs to be done  Additional Screening:  Hepatitis C Screening: completed 07/02/2019  Vision Screening: Recommended annual ophthalmology exams for early detection of glaucoma and other disorders of the eye. Is the patient up to date with their annual eye exam?  Yes  Who is the provider or what is the name of the [facility] which the patient [facility]ends [facility] exams[facility] not established with a provider, would they like to be referred to a provider to establish care?  N/A .   Dental Screening: Recommended annual dental exams for proper oral hygiene  Community Resource Referral / Chronic Care Management: CRR required this visit?  No   CCM required this visit?  No      Plan:     I have personally reviewed and noted the following in the patient's chart:   Medical and social history Use of alcohol, tobacco or illicit drugs  Current medications and supplements including opioid prescriptions. Patient is not currently taking opioid prescriptions. Functional ability and status Nutritional status Physical activity Advanced directives List of other physicians Hospitalizations, surgeries, and ER visits in previous 12 months Vitals Screenings to include cognitive, depression, and falls Referrals and appointments  In addition, I have  reviewed and discussed with patient certain preventive protocols, quality metrics, and best practice recommendations. A written personalized care plan for preventive services as well as general preventive health recommendations were provided to patient.     JFelicity CoyerLPN     [id number]  Nurse Notes: Patient is due for Prevnar 20 and Shingrix vaccine.  She states she had a reaction to the first Shingrix vaccine she received and does not wish to have another one.  She is also due for a colonoscopy but declines referral to GI because she does not wish to do another one.  She is a smoker of 40 years and does qualify for a lung cancer screening and needs to be referred.

## 2022-05-04 DIAGNOSIS — M4316 Spondylolisthesis, lumbar region: Secondary | ICD-10-CM | POA: Diagnosis not present

## 2022-05-04 DIAGNOSIS — M5136 Other intervertebral disc degeneration, lumbar region: Secondary | ICD-10-CM | POA: Diagnosis not present

## 2022-05-05 ENCOUNTER — Other Ambulatory Visit: Payer: Self-pay | Admitting: Family Medicine

## 2022-05-05 DIAGNOSIS — Z1231 Encounter for screening mammogram for malignant neoplasm of breast: Secondary | ICD-10-CM

## 2022-05-12 ENCOUNTER — Other Ambulatory Visit: Payer: Self-pay | Admitting: Family Medicine

## 2022-05-12 DIAGNOSIS — E559 Vitamin D deficiency, unspecified: Secondary | ICD-10-CM

## 2022-05-18 DIAGNOSIS — C73 Malignant neoplasm of thyroid gland: Secondary | ICD-10-CM | POA: Diagnosis not present

## 2022-05-18 DIAGNOSIS — Z1330 Encounter for screening examination for mental health and behavioral disorders, unspecified: Secondary | ICD-10-CM | POA: Diagnosis not present

## 2022-05-18 DIAGNOSIS — E890 Postprocedural hypothyroidism: Secondary | ICD-10-CM | POA: Diagnosis not present

## 2022-05-30 NOTE — Progress Notes (Unsigned)
Cardiology Office Note   Date:  06/02/2022   ID:  Madison [name] January 20, 1946, MRN [id number]  PCP:  [name], DO  Cardiologist:   Minus Breeding, MD   Chief Complaint  Patient presents with   Back Pain      History of Present Illness: Madison [name] is a 77 y.o. female who for followup of CAD.  She had a negative Lexiscan Myoview and normal echo in 2018.  Since I last saw her she has had some back pain when she goes to doing things like vacuuming.  This may be somewhat of a chronic problem.  She thinks lumbar spine surgery has finally helped her.  She had this prior to the last visit.  Her husband still has significant health problems with decreased memory.  He had a previous heart attack.  She is not having any chest discomfort but that was never her anginal symptom.  He is not having any new shortness of breath, PND or orthopnea.  She has had no new palpitations, presyncope or syncope.   Past Medical History:  Diagnosis Date   Anxiety    CAD (coronary artery disease)    PCI of an occluded right coronary artery 2001. 70% LAD stenosis, 30% circumflex stenosis.   Diverticulosis    Hyperlipidemia    Hypertension    IBS (irritable bowel syndrome)    Leukocytosis, unspecified 07/24/2012   Myocardial infarction [facility]) 2001   Need for prophylactic hormone replacement therapy (postmenopausal)    Osteopenia    Osteoporosis    PONV (postoperative nausea and vomiting)    Thyroid cancer ([place]) 2004   Dr. Clovia Cuff    Thyroid nodule 2004   Tobacco abuse     Past Surgical History:  Procedure Laterality Date   [place] SURGERY  06/2000   CARDIAC CATHETERIZATION  2001   CHOLECYSTECTOMY  07/2002   EYE SURGERY Bilateral 2019   cataract   LIPOMA EXCISION Right 08/2003   r arm    LUMBAR LAMINECTOMY/DECOMPRESSION MICRODISCECTOMY Left 03/20/2020   Procedure: LEFT LUMBAR FOUR-FIVE MICRODISCECTOMY;  Surgeon: Newman Pies, MD;  Location: [place];   Service: Neurosurgery;  Laterality: Left;   peserie     THYROIDECTOMY Bilateral 02/2003     Current Outpatient Medications  Medication Sig Dispense Refill   amLODipine (NORVASC) 10 MG tablet Take 1 tablet (10 mg total) by mouth daily. 90 tablet 3   aspirin EC 81 MG tablet Take 1 tablet (81 mg total) by mouth daily. Swallow whole. 30 tablet 11   busPIRone (BUSPAR) 30 MG tablet Take 1 tablet (30 mg total) by mouth 2 (two) times daily. 180 tablet 3   conjugated estrogens (PREMARIN) vaginal cream Place 1 Applicatorful vaginally 3 (three) times a week. (Patient taking differently: Place 1 Applicatorful vaginally 3 (three) times a week. .625 mg) 30 g prn   fenofibrate 160 MG tablet Take 1 tablet (160 mg total) by mouth daily. 90 tablet 3   isosorbide mononitrate (IMDUR) 60 MG 24 hr tablet Take 1 tablet (60 mg total) by mouth daily. 90 tablet 3   levothyroxine (SYNTHROID) 125 MCG tablet Take 125 mcg by mouth See admin instructions.     LORazepam (ATIVAN) 1 MG tablet Take 0.5-1 tablets (0.5-1 mg total) by mouth daily as needed for anxiety. 30 tablet 1   losartan (COZAAR) 100 MG tablet Take 1 tablet (100 mg total) by mouth daily. 90 tablet 3  Melatonin 5 MG TABS Take 5 mg by mouth at bedtime as needed (sleep).     metoprolol tartrate (LOPRESSOR) 50 MG tablet TAKE ONE (1) TABLET EACH DAY 90 tablet 3   nitroGLYCERIN (NITROSTAT) 0.4 MG SL tablet Place 1 tablet (0.4 mg total) under the tongue every 5 (five) minutes as needed for chest pain. 25 tablet 3   rosuvastatin (CRESTOR) 40 MG tablet 1 tablet daily (Patient taking differently: Take 40 mg by mouth daily. 1 tablet daily) 90 tablet 3   Vitamin D, Ergocalciferol, (DRISDOL) 1.25 MG (50000 UNIT) CAPS capsule TAKE 1 CAPSULE EVERY 7 DAYS 12 capsule 1   No current facility-administered medications for this visit.    Allergies:   Banana, Bupropion hcl, Chantix [varenicline], Latex, and Zetia [ezetimibe]    ROS:  Please see the history of present  illness.   Otherwise, review of systems are positive for none.   All other systems are reviewed and negative.    PHYSICAL EXAM: VS:  BP (!) 168/86   Pulse 72   Ht 5' 5"$  (1.651 m)   Wt 160 lb (72.6 kg)   BMI 26.63 kg/m  , BMI Body mass index is 26.63 kg/m. GENERAL:  Well appearing NECK:  No jugular venous distention, waveform within normal limits, carotid upstroke brisk and symmetric, no bruits, no thyromegaly LUNGS:  Clear to auscultation bilaterally CHEST:  Unremarkable HEART:  PMI not displaced or sustained,S1 and S2 within normal limits, no S3, no S4, no clicks, no rubs, no murmurs ABD:  Flat, positive bowel sounds normal in frequency in pitch, no bruits, no rebound, no guarding, no midline pulsatile mass, no hepatomegaly, no splenomegaly EXT:  2 plus pulses throughout, no edema, no cyanosis no clubbing  EKG:  EKG is  ordered today. The ekg ordered today demonstrates sinus rhythm, rate 72, right bundle branch block, no change from previous, no acute ST-T wave changes.   Recent Labs: 06/29/2021: ALT 17; BUN 22; Creatinine, Ser 0.99; Hemoglobin 15.5; Platelets 241; Potassium 4.6; Sodium 140    Lipid Panel    Component Value Date/Time   CHOL 113 06/29/2021 0928   CHOL 107 08/30/2012 0921   TRIG 141 06/29/2021 0928   TRIG 147 05/18/2016 1209   TRIG 165 (H) 08/30/2012 0921   HDL 22 (L) 06/29/2021 0928   HDL 22 (L) 05/18/2016 1209   HDL 32 (L) 08/30/2012 0921   CHOLHDL 5.1 (H) 06/29/2021 0928   LDLCALC 66 06/29/2021 0928   LDLCALC 69 01/17/2014 0850   LDLCALC 42 08/30/2012 0921      Wt Readings from Last 3 Encounters:  06/02/22 160 lb (72.6 kg)  03/26/22 163 lb 4 oz (74 kg)  12/29/21 169 lb 9.6 oz (76.9 kg)      Other studies Reviewed: Additional studies/ records that were reviewed today include: Labs. Review of the above records demonstrates:  Please see elsewhere in the note.     ASSESSMENT AND PLAN:   CORONARY ARTERY DISEASE -  She does have some back pain  that could be an anginal equivalent.  I am going to add Imdur 60 mg daily to her regimen to see if this helps.  If not I might titrate up for.  She would not want another stress test.  If she has continued discomfort I could consider cardiac cath.    RBBB:      This is chronic.  No change in therapy.  TOBACCO ABUSE -  She is unable to quit and we have  talked about this multiple times including today.   DYSLIPIDEMIA -  LDL is 66.  No change in therapy  HTN - The blood pressure is elevated and I am going to increase the amlodipine to 10 mg  Current medicines are reviewed at length with the patient today.  The patient does not have concerns regarding medicines.  The following changes have been made:   None  Labs/ tests ordered today include: None  Orders Placed This Encounter  Procedures   EKG 12-Lead     Disposition:   FU with me in 3 months.     Signed, Minus Breeding, MD  06/02/2022 1:31 PM    [place]

## 2022-06-02 ENCOUNTER — Encounter: Payer: Self-pay | Admitting: Cardiology

## 2022-06-02 ENCOUNTER — Ambulatory Visit: Payer: PPO | Admitting: Cardiology

## 2022-06-02 VITALS — BP 168/86 | HR 72 | Ht 65.0 in | Wt 160.0 lb

## 2022-06-02 DIAGNOSIS — E785 Hyperlipidemia, unspecified: Secondary | ICD-10-CM | POA: Diagnosis not present

## 2022-06-02 DIAGNOSIS — I10 Essential (primary) hypertension: Secondary | ICD-10-CM | POA: Diagnosis not present

## 2022-06-02 DIAGNOSIS — I2510 Atherosclerotic heart disease of native coronary artery without angina pectoris: Secondary | ICD-10-CM

## 2022-06-02 MED ORDER — ISOSORBIDE MONONITRATE ER 60 MG PO TB24: 60.0000 mg | ORAL_TABLET | Freq: Every day | ORAL | 3 refills | Status: DC

## 2022-06-02 MED ORDER — AMLODIPINE BESYLATE 10 MG PO TABS: 10.0000 mg | ORAL_TABLET | Freq: Every day | ORAL | 3 refills | Status: DC

## 2022-06-02 NOTE — Patient Instructions (Signed)
Medication Instructions:  Please start Isosorbide 60 mg a day. Increase Amlodipine 10 mg a day. Continue all other medications as listed.  *If you need a refill on your cardiac medications before your next appointment, please call your pharmacy*  Follow-Up: At [facility], you and your health needs are our priority.  As part of our continuing mission to provide you with exceptional heart care, we have created designated Provider Care Teams.  These Care Teams include your primary Cardiologist (physician) and Advanced Practice Providers (APPs -  Physician Assistants and Nurse Practitioners) who all work together to provide you with the care you need, when you need it.  We recommend signing up for the patient portal called "MyChart".  Sign up information is provided on this After Visit Summary.  MyChart is used to connect with patients for Virtual Visits (Telemedicine).  Patients are able to view lab/test results, encounter notes, upcoming appointments, etc.  Non-urgent messages can be sent to your provider as well.   To learn more about what you can do with MyChart, go to [email or web address].    Your next appointment:   3 month(s)  Provider:   Minus Breeding, MD

## 2022-06-24 ENCOUNTER — Ambulatory Visit
Admission: RE | Admit: 2022-06-24 | Discharge: 2022-06-24 | Disposition: A | Discharge status: Home / Self Care | Payer: PPO | Source: Ambulatory Visit | Attending: Family Medicine | Admitting: Family Medicine

## 2022-06-24 DIAGNOSIS — Z1231 Encounter for screening mammogram for malignant neoplasm of breast: Secondary | ICD-10-CM | POA: Diagnosis not present

## 2022-07-02 ENCOUNTER — Ambulatory Visit (INDEPENDENT_AMBULATORY_CARE_PROVIDER_SITE_OTHER): Payer: PPO | Admitting: Family Medicine

## 2022-07-02 ENCOUNTER — Encounter: Payer: Self-pay | Admitting: Family Medicine

## 2022-07-02 VITALS — BP 125/68 | HR 82 | Temp 98.7°F | Ht 65.0 in | Wt 159.8 lb

## 2022-07-02 DIAGNOSIS — Z0000 Encounter for general adult medical examination without abnormal findings: Secondary | ICD-10-CM

## 2022-07-02 DIAGNOSIS — I2510 Atherosclerotic heart disease of native coronary artery without angina pectoris: Secondary | ICD-10-CM

## 2022-07-02 DIAGNOSIS — G479 Sleep disorder, unspecified: Secondary | ICD-10-CM | POA: Diagnosis not present

## 2022-07-02 DIAGNOSIS — E890 Postprocedural hypothyroidism: Secondary | ICD-10-CM | POA: Diagnosis not present

## 2022-07-02 DIAGNOSIS — F17210 Nicotine dependence, cigarettes, uncomplicated: Secondary | ICD-10-CM

## 2022-07-02 DIAGNOSIS — F411 Generalized anxiety disorder: Secondary | ICD-10-CM | POA: Diagnosis not present

## 2022-07-02 DIAGNOSIS — Z0001 Encounter for general adult medical examination with abnormal findings: Secondary | ICD-10-CM

## 2022-07-02 DIAGNOSIS — I10 Essential (primary) hypertension: Secondary | ICD-10-CM

## 2022-07-02 DIAGNOSIS — F17200 Nicotine dependence, unspecified, uncomplicated: Secondary | ICD-10-CM | POA: Diagnosis not present

## 2022-07-02 DIAGNOSIS — M810 Age-related osteoporosis without current pathological fracture: Secondary | ICD-10-CM

## 2022-07-02 DIAGNOSIS — Z6379 Other stressful life events affecting family and household: Secondary | ICD-10-CM

## 2022-07-02 DIAGNOSIS — E663 Overweight: Secondary | ICD-10-CM | POA: Diagnosis not present

## 2022-07-02 DIAGNOSIS — E7800 Pure hypercholesterolemia, unspecified: Secondary | ICD-10-CM | POA: Diagnosis not present

## 2022-07-02 DIAGNOSIS — Z130 Encounter for screening for diseases of the blood and blood-forming organs and certain disorders involving the immune mechanism: Secondary | ICD-10-CM

## 2022-07-02 LAB — LIPID PANEL

## 2022-07-02 LAB — BAYER DCA HB A1C WAIVED: HB A1C (BAYER DCA - WAIVED): 5.8 % — ABNORMAL HIGH (ref 4.8–5.6)

## 2022-07-02 MED ORDER — LORAZEPAM 1 MG PO TABS: 0.5000 mg | ORAL_TABLET | Freq: Every day | ORAL | 1 refills | Status: DC | PRN

## 2022-07-02 NOTE — Progress Notes (Unsigned)
Madison [name] is a 77 y.o. female presents to office today for annual physical exam examination.    Concerns today include: 1. Anxiety Continues to suffer from anxiety.  She has been utilizing her BuSpar 30 mg twice daily and takes typically about 0.5 mg of Ativan at nighttime for sleep.  She worries about her husband, who suffered a heart attack about a year and a half ago.  Ever since he was hospitalized he just seems to be having a decline in his memory.  She denies any excessive daytime sedation, falls, respiratory depression or visual auditory hallucinations or memory loss related to use of benzodiazepine.  Occupation: retired, Marital status: married, Substance use: smoker.  She has been a smoker for several decades.  Over the last 10 years she has been smoking roughly 3/4 of a pack per day.  She denies any hemoptysis, unplanned weight loss, night sweats, difficulty swallowing or change in exercise tolerance  Diet: Typical American Last eye exam: Up-to-date Last mammogram: Up-to-date Last pap smear: N/A Refills needed today: Ativan Immunizations needed: Immunization History  Administered Date(s) Administered   Fluad Quad(high Dose 65+) 01/29/2019, 01/21/2020, 02/01/2022   Influenza, High Dose Seasonal PF 03/19/2015, 02/02/2016, 01/25/2017, 02/02/2018   Influenza,inj,Quad PF,6+ Mos 01/25/2013, 01/31/2014   Influenza-Unspecified 01/26/2021   PFIZER(Purple Top)SARS-COV-2 Vaccination 05/08/2019, 05/27/2019, 02/08/2020, 07/28/2020, 01/28/2022   Pneumococcal Conjugate-13 05/15/2013   Pneumococcal Polysaccharide-23 02/02/2012   Respiratory Syncytial Virus Vaccine,Recomb Aduvanted(Arexvy) 01/11/2022   Td 06/27/2020   Tdap 05/26/2007   Zoster, Live 11/26/2009     Past Medical History:  Diagnosis Date   Anxiety    CAD (coronary artery disease)    PCI of an occluded right coronary artery 2001. 70% LAD stenosis, 30% circumflex stenosis.   Diverticulosis    Hyperlipidemia     Hypertension    IBS (irritable bowel syndrome)    Leukocytosis, unspecified 07/24/2012   Myocardial infarction [facility]) 2001   Need for prophylactic hormone replacement therapy (postmenopausal)    Osteopenia    Osteoporosis    PONV (postoperative nausea and vomiting)    Thyroid cancer ([place]) 2004   Dr. Clovia Cuff    Thyroid nodule 2004   Tobacco abuse    Social History   Socioeconomic History   Marital status: Married    Spouse name: Not on file   Number of children: 2   Years of education: Not on file   Highest education level: Not on file  Occupational History   Occupation: Retired     Comment: Engineering geologist  Tobacco Use   Smoking status: Every Day    Packs/day: 1.00    Years: 40.00    Additional pack years: 0.00    Total pack years: 40.00    Types: Cigarettes   Smokeless tobacco: Never  Vaping Use   Vaping Use: Never used  Substance and Sexual Activity   Alcohol use: No   Drug use: No   Sexual activity: Not Currently  Other Topics Concern   Not on file  Social History Narrative   Dr.  Cyril Loosen -(surgeon) [phone]   Dr. Marilynne Halsted- Endocrinologist  [facility]    Social Determinants of Health   Financial Resource Strain: Low Risk  (03/31/2022)   Overall Financial Resource Strain (CARDIA)    Difficulty of Paying Living Expenses: Not hard at all  Food Insecurity: No Food Insecurity (03/31/2022)   Hunger Vital Sign    Worried About Running Out of Food in the Last Year: Never true    Ran  Out of Food in the Last Year: Never true  Transportation Needs: No Transportation Needs (03/31/2022)   PRAPARE - Hydrologist (Medical): No    Lack of Transportation (Non-Medical): No  Physical Activity: Insufficiently Active (03/31/2022)   Exercise Vital Sign    Days of Exercise per Week: 3 days    Minutes of Exercise per Session: 30 min  Stress: No Stress Concern Present (03/31/2022)   [place]    Feeling of Stress : Not at all  Social Connections: Moderately Isolated (03/31/2022)   Social Connection and Isolation Panel [NHANES]    Frequency of Communication with Friends and Family: More than three times a week    Frequency of Social Gatherings with Friends and Family: Once a week    Attends Religious Services: Never    Marine scientist or Organizations: No    Attends Archivist Meetings: Never    Marital Status: Married  Human resources officer Violence: Not At Risk (03/31/2022)   Humiliation, Afraid, Rape, and Kick questionnaire    Fear of Current or Ex-Partner: No    Emotionally Abused: No    Physically Abused: No    Sexually Abused: No   Past Surgical History:  Procedure Laterality Date   [place]  06/2000   CARDIAC CATHETERIZATION  2001   CHOLECYSTECTOMY  07/2002   EYE SURGERY Bilateral 2019   cataract   LIPOMA EXCISION Right 08/2003   r arm    LUMBAR LAMINECTOMY/DECOMPRESSION MICRODISCECTOMY Left 03/20/2020   Procedure: LEFT LUMBAR FOUR-FIVE MICRODISCECTOMY;  Surgeon: Newman Pies, MD;  Location: [facility];  Service: Neurosurgery;  Laterality: Left;   peserie     THYROIDECTOMY Bilateral 02/2003   Family History  Problem Relation Age of Onset   Coronary artery disease Father    Heart disease Father    Cirrhosis Mother    Alcohol abuse Mother    Cancer Other        family history    Hypertension Maternal Grandmother     Current Outpatient Medications:    amLODipine (NORVASC) 10 MG tablet, Take 1 tablet (10 mg total) by mouth daily., Disp: 90 tablet, Rfl: 3   aspirin EC 81 MG tablet, Take 1 tablet (81 mg total) by mouth daily. Swallow whole., Disp: 30 tablet, Rfl: 11   busPIRone (BUSPAR) 30 MG tablet, Take 1 tablet (30 mg total) by mouth 2 (two) times daily., Disp: 180 tablet, Rfl: 3   conjugated estrogens (PREMARIN) vaginal cream, Place 1 Applicatorful vaginally 3 (three) times a week. (Patient taking  differently: Place 1 Applicatorful vaginally 3 (three) times a week. .625 mg), Disp: 30 g, Rfl: prn   fenofibrate 160 MG tablet, Take 1 tablet (160 mg total) by mouth daily., Disp: 90 tablet, Rfl: 3   isosorbide mononitrate (IMDUR) 60 MG 24 hr tablet, Take 1 tablet (60 mg total) by mouth daily., Disp: 90 tablet, Rfl: 3   levothyroxine (SYNTHROID) 125 MCG tablet, Take 125 mcg by mouth See admin instructions., Disp: , Rfl:    LORazepam (ATIVAN) 1 MG tablet, Take 0.5-1 tablets (0.5-1 mg total) by mouth daily as needed for anxiety., Disp: 30 tablet, Rfl: 1   losartan (COZAAR) 100 MG tablet, Take 1 tablet (100 mg total) by mouth daily., Disp: 90 tablet, Rfl: 3   Melatonin 5 MG TABS, Take 5 mg by mouth at bedtime as needed (sleep)., Disp: , Rfl:  metoprolol tartrate (LOPRESSOR) 50 MG tablet, TAKE ONE (1) TABLET EACH DAY, Disp: 90 tablet, Rfl: 3   nitroGLYCERIN (NITROSTAT) 0.4 MG SL tablet, Place 1 tablet (0.4 mg total) under the tongue every 5 (five) minutes as needed for chest pain., Disp: 25 tablet, Rfl: 3   rosuvastatin (CRESTOR) 40 MG tablet, 1 tablet daily (Patient taking differently: Take 40 mg by mouth daily. 1 tablet daily), Disp: 90 tablet, Rfl: 3   Vitamin D, Ergocalciferol, (DRISDOL) 1.25 MG (50000 UNIT) CAPS capsule, TAKE 1 CAPSULE EVERY 7 DAYS, Disp: 12 capsule, Rfl: 1  Allergies  Allergen Reactions   Banana Itching and Swelling    Throat and ears itch,ears feel like they swell   Bupropion Hcl     REACTION: paranoia   Chantix [Varenicline]     paranoia   Latex Rash   Zetia [Ezetimibe]     Myalgia     ROS: Review of Systems Pertinent items noted in HPI and remainder of comprehensive ROS otherwise negative.    Physical exam BP 125/68   Pulse 82   Temp 98.7 F (37.1 C)   Ht 5\' 5"  (1.651 m)   Wt 159 lb 12.8 oz (72.5 kg)   SpO2 96%   BMI 26.59 kg/m  General appearance: alert, cooperative, appears stated age, and no distress Head: Normocephalic, without obvious abnormality,  atraumatic Eyes: negative findings: lids and lashes normal, conjunctivae and sclerae normal, corneas clear, and pupils equal, round, reactive to light and accomodation Ears: normal TM's and external ear canals both ears Nose: Nares normal. Septum midline. Mucosa normal. No drainage or sinus tenderness. Throat: lips, mucosa, and tongue normal; teeth and gums normal Neck: no adenopathy, no carotid bruit, supple, symmetrical, trachea midline, and thyroid not enlarged, symmetric, no tenderness/mass/nodules Back: symmetric, no curvature. ROM normal. No CVA tenderness. Lungs: clear to auscultation bilaterally Heart: regular rate and rhythm, S1, S2 normal, no murmur, click, rub or gallop Abdomen: soft, non-tender; bowel sounds normal; no masses,  no organomegaly Extremities: extremities normal, atraumatic, no cyanosis or edema Pulses: 2+ and symmetric Skin: Skin color, texture, turgor normal. No rashes or lesions Lymph nodes: Cervical, supraclavicular, and axillary nodes normal. Neurologic: Grossly normal Psych: Anxious when talking about her husband otherwise mood is stable.  Speech normal.  Affect appropriate.  Does not appear to be responding to internal stimuli     07/02/2022    9:24 AM 03/31/2022    1:23 PM 03/26/2022   11:32 AM  Depression screen PHQ 2/9  Decreased Interest 0 0 1  Down, Depressed, Hopeless 0 0 2  PHQ - 2 Score 0 0 3  Altered sleeping 0  3  Tired, decreased energy 0  2  Change in appetite 0  2  Feeling bad or failure about yourself  0  0  Trouble concentrating 0  0  Moving slowly or fidgety/restless 0  0  Suicidal thoughts 0  0  PHQ-9 Score 0  10  Difficult doing work/chores Not difficult at all  Somewhat difficult      07/02/2022    9:19 AM 03/26/2022   11:33 AM 12/29/2021    9:33 AM 09/09/2021   11:16 AM  GAD 7 : Generalized Anxiety Score  Nervous, Anxious, on Edge 0 2 0 1  Control/stop worrying 0 1 0 0  Worry too much - different things 0 0 0 0  Trouble  relaxing 0 1 0 0  Restless 0 0 0 0  Easily annoyed or irritable 0 0 0  0  Afraid - awful might happen 0 0 0 0  Total GAD 7 Score 0 4 0 1  Anxiety Difficulty Not difficult at all Somewhat difficult Not difficult at all Not difficult at all    Assessment/ Plan: Corretta H Koran here for annual physical exam.   Annual physical exam  Pure hypercholesterolemia - Plan: CMP14+EGFR, Lipid Panel  Essential hypertension - Plan: CMP14+EGFR  Coronary artery disease involving native coronary artery of native heart without angina pectoris - Plan: CMP14+EGFR, Lipid Panel  GAD (generalized anxiety disorder) - Plan: LORazepam (ATIVAN) 1 MG tablet  Stress due to illness of family member  Sleep difficulties  Age-related osteoporosis without current pathological fracture - Plan: CMP14+EGFR, VITAMIN D 25 Hydroxy (Vit-D Deficiency, Fractures)  Postoperative hypothyroidism - Plan: CANCELED: TSH, CANCELED: T4, Free  Overweight (BMI 25.0-29.9) - Plan: Bayer DCA Hb A1c Waived  Screening, anemia, deficiency, iron - Plan: CBC  TOBACCO ABUSE - Plan: CBC, CT CHEST LUNG CA SCREEN LOW DOSE W/O CM  Fasting labs collected.  Continue current regimen.  Blood pressure well-controlled.  No changes  Discussed potential need for long-term medication like Pristiq which would be a bit more cardiovascular friendly if she is going to be utilizing the Ativan frequently.  She wants to consider this and we will reconvene again in 3 to 4 months to discuss further.  National narcotic database reviewed and there were no red flags.  She is up-to-date on UDS and CSC  Given history of osteoporosis, check vitamin D level, calcium level  She is followed by endocrinology and they will check her thyroid levels soon as medications have been recently adjusted  A1c demonstrates prediabetes at 5.8 today.  Reinforced lifestyle modification  Check CBC given ongoing tobacco use.  Low-dose lung cancer screening ordered today.  Counseled  on smoking cessation.  She is considering gum  Counseled on healthy lifestyle choices, including diet (rich in fruits, vegetables and lean meats and low in salt and simple carbohydrates) and exercise (at least 30 minutes of moderate physical activity daily).  Anabella Capshaw M. Lajuana Ripple, DO

## 2022-07-02 NOTE — Patient Instructions (Signed)
Tobacco Use Disorder Tobacco use disorder (TUD) occurs when a person craves, seeks, and uses tobacco, regardless of the consequences. This disorder can cause problems with mental and physical health. It can affect your ability to have healthy relationships. It can also keep you from meeting your responsibilities at work, home, or school. Tobacco products contain a dangerous chemical called nicotine. Nicotine triggers hormones that make the body feel stimulated and works on areas of the brain that make a person feel good. These effects can make the person depend on nicotine, which makes it hard to quit tobacco. Tobacco may be: Smoked as a cigarette or cigar. Inhaled using vaping devices, such as e-cigarettes. Smoked in a pipe or hookah. Chewed as smokeless tobacco. Inhaled into the nostrils as snuff. What are the causes? This condition is caused by using nicotine. Nicotine causes changes in your brain that make you want more and more. This is called addiction. This can make it hard to stop using tobacco once you start. What are the signs or symptoms? Symptoms of TUD may include: Being unable to stop your tobacco use even after planned quit attempts. Spending an abnormal amount of time getting or using tobacco. Craving tobacco. Tobacco use that: Interferes with your work, school, or home life. Interferes with your personal and social relationships. Makes you give up activities that you once enjoyed or found important. Using tobacco even though you know that it is: Dangerous or bad for your health or someone else's health. Causing problems in your life. Needing more and more of the substance to get the same effect (developing tolerance). Using the substance to avoid unpleasant symptoms if you do not use the substance (withdrawal). How is this diagnosed? This condition may be diagnosed based on: Your current and past tobacco use. Your health care provider may ask questions about how your  tobacco use affects your life. A physical exam. You may be diagnosed with TUD if you have at least two symptoms within a 12-month period. How is this treated? This condition is treated by stopping tobacco use. Many people are unable to quit on their own and need help. Treatment may include: Nicotine replacement therapy (NRT). NRT provides nicotine without the other harmful chemicals in tobacco. NRT gradually lowers the dosage of nicotine in the body and reduces withdrawal symptoms. NRT is available as: Over-the-counter gums, lozenges, and skin patches. Prescription mouth inhalers and nasal sprays. Medicine that acts on the brain to reduce cravings and withdrawal symptoms. A type of talk therapy that examines your triggers for tobacco use, how to avoid them, and how to cope with cravings (behavioral therapy). Hypnosis. This may help with withdrawal symptoms. Joining a support group for people coping with TUD. The best treatment for TUD is usually a combination of medicine, talk therapy, and support groups. Recovery can be a long process. Many people start using tobacco again after stopping (relapse). If you relapse, it does not mean that treatment will not work. Follow these instructions at home:  Lifestyle Do not use any products that contain nicotine or tobacco. These products include cigarettes, chewing tobacco, and vaping devices, such as e-cigarettes. If you need help quitting, ask your health care provider. Avoid things that trigger tobacco use as much as you can. Triggers include people and situations that usually cause you to use tobacco. Avoid drinks that contain caffeine, including coffee. These may worsen some withdrawal symptoms. Find ways to manage stress. Wanting to smoke may cause stress, and stress can make you want   to smoke. Relaxation techniques such as deep breathing, meditation, and yoga may help. Attend support groups as needed. These groups are an important part of long-term  recovery for many people. General instructions Take over-the-counter and prescription medicines only as told by your health care provider. Check with your health care provider before taking any new prescription or over-the-counter medicines. Decide on a friend, family member, or smoking quit-line (such as 1-800-QUIT-NOW in the [place] that you can call or text when you feel the urge to smoke or when you need help coping with cravings. Keep all follow-up visits. This is important. Contact a health care provider if: You are not able to take your medicines as told by your health care provider. Your symptoms get worse, even with treatment. Summary Tobacco use disorder (TUD) occurs when a person craves, seeks, and uses tobacco regardless of the consequences. This condition may be diagnosed based on your current and past tobacco use and a physical exam. Many people are unable to quit on their own and need help. Recovery can be a long process. The most effective treatment for TUD is usually a combination of medicine, talk therapy, and support groups. This information is not intended to replace advice given to you by your health care provider. Make sure you discuss any questions you have with your health care provider. Document Revised: 03/31/2021 Document Reviewed: 03/31/2021 Elsevier Patient Education  2023 Elsevier Inc.  

## 2022-07-03 LAB — LIPID PANEL
Chol/HDL Ratio: 4.4 ratio (ref 0.0–4.4)
Cholesterol, Total: 111 mg/dL (ref 100–199)
HDL: 25 mg/dL — ABNORMAL LOW (ref >39)
LDL Chol Calc (NIH): 64 mg/dL (ref 0–99)
Triglycerides: 118 mg/dL (ref 0–149)
VLDL Cholesterol Cal: 22 mg/dL (ref 5–40)

## 2022-07-03 LAB — VITAMIN D 25 HYDROXY (VIT D DEFICIENCY, FRACTURES): Vit D, 25-Hydroxy: 66.5 ng/mL (ref 30.0–100.0)

## 2022-07-03 LAB — CMP14+EGFR
ALT: 17 IU/L (ref 0–32)
AST: 21 IU/L (ref 0–40)
Albumin/Globulin Ratio: 1.7 (ref 1.2–2.2)
Albumin: 4.2 g/dL (ref 3.8–4.8)
Alkaline Phosphatase: 56 IU/L (ref 44–121)
BUN/Creatinine Ratio: 20 (ref 12–28)
BUN: 16 mg/dL (ref 8–27)
Bilirubin Total: 0.4 mg/dL (ref 0.0–1.2)
CO2: 20 mmol/L (ref 20–29)
Calcium: 9.3 mg/dL (ref 8.7–10.3)
Chloride: 107 mmol/L — ABNORMAL HIGH (ref 96–106)
Creatinine, Ser: 0.8 mg/dL (ref 0.57–1.00)
Globulin, Total: 2.5 g/dL (ref 1.5–4.5)
Glucose: 109 mg/dL — ABNORMAL HIGH (ref 70–99)
Potassium: 3.8 mmol/L (ref 3.5–5.2)
Sodium: 143 mmol/L (ref 134–144)
Total Protein: 6.7 g/dL (ref 6.0–8.5)
eGFR: 77 mL/min/{1.73_m2} (ref >59)

## 2022-07-03 LAB — CBC
Hematocrit: 47.7 % — ABNORMAL HIGH (ref 34.0–46.6)
Hemoglobin: 15.8 g/dL (ref 11.1–15.9)
MCH: 31 pg (ref 26.6–33.0)
MCHC: 33.1 g/dL (ref 31.5–35.7)
MCV: 94 fL (ref 79–97)
Platelets: 241 10*3/uL (ref 150–450)
RBC: 5.09 x10E6/uL (ref 3.77–5.28)
RDW: 12.4 % (ref 11.7–15.4)
WBC: 8.7 10*3/uL (ref 3.4–10.8)

## 2022-07-27 DIAGNOSIS — E890 Postprocedural hypothyroidism: Secondary | ICD-10-CM | POA: Diagnosis not present

## 2022-08-05 IMAGING — CR DG LUMBAR SPINE 2-3V
3 series · 3 of 3 positions shown · non-contrast
Comparison: MR 02/05/2020

CLINICAL DATA: L4-5 micro discectomy, intraoperative localization

EXAM:
LUMBAR SPINE - 2-3 VIEW

[xtable lateral (1 of 3)]
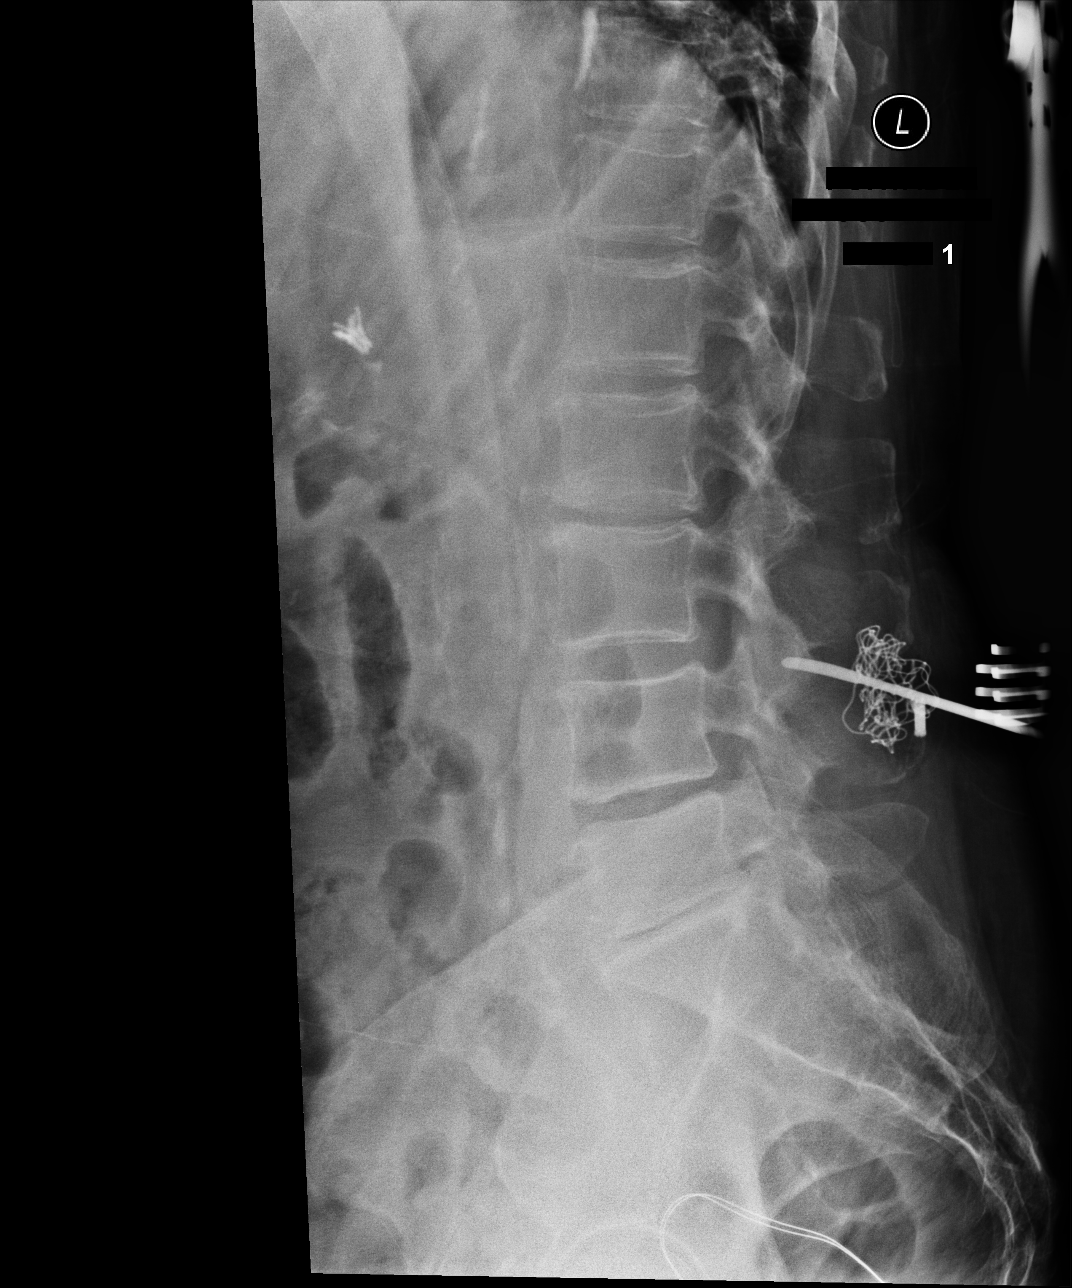

[xtable lateral (2 of 3)]
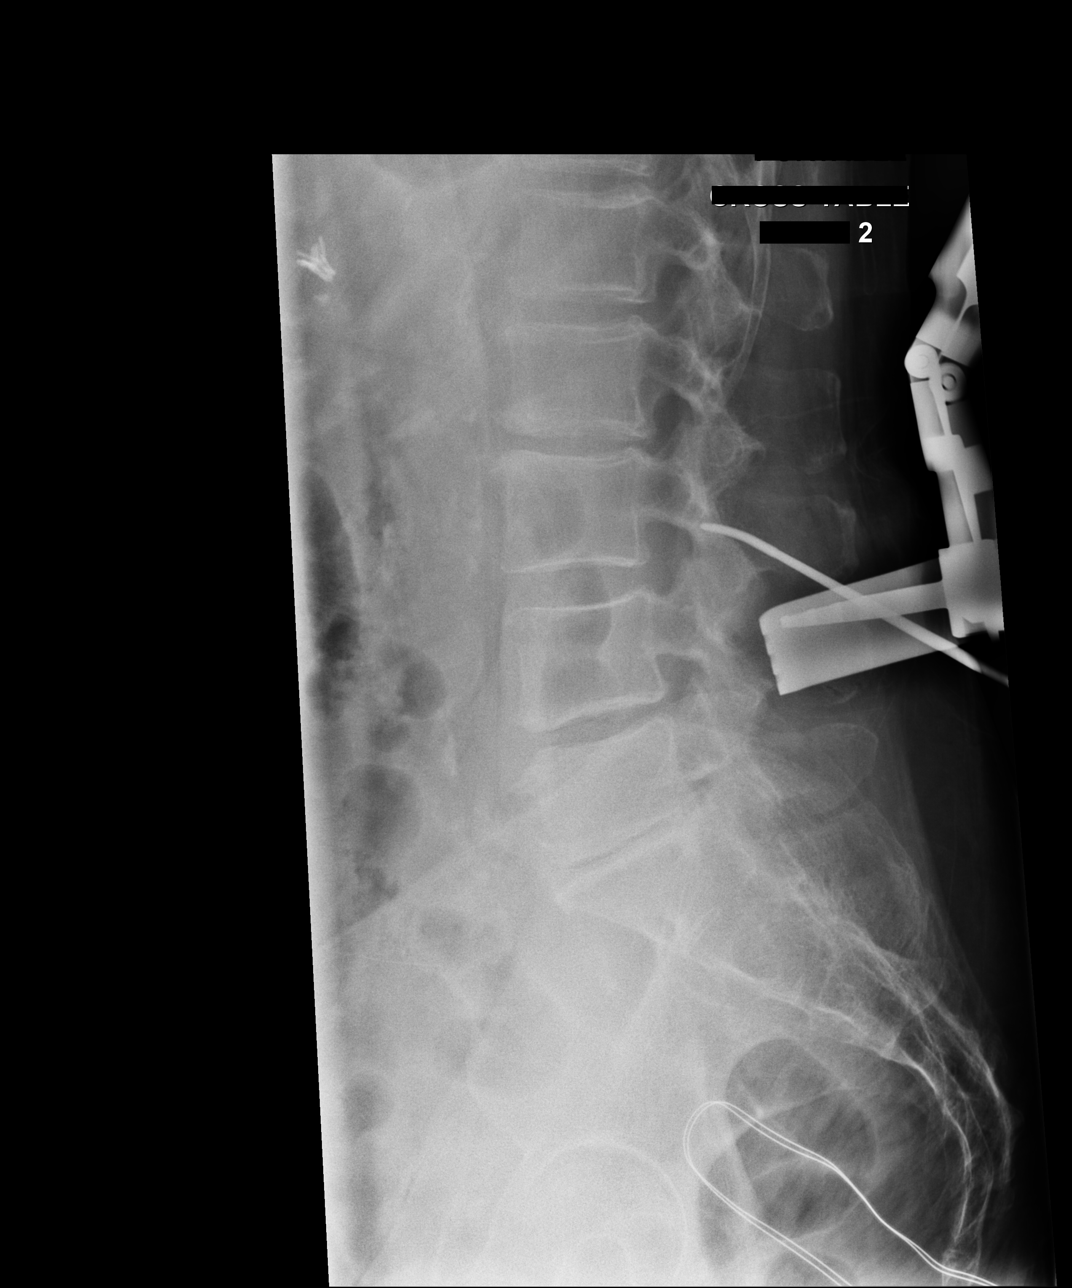

[xtable lateral (3 of 3)]
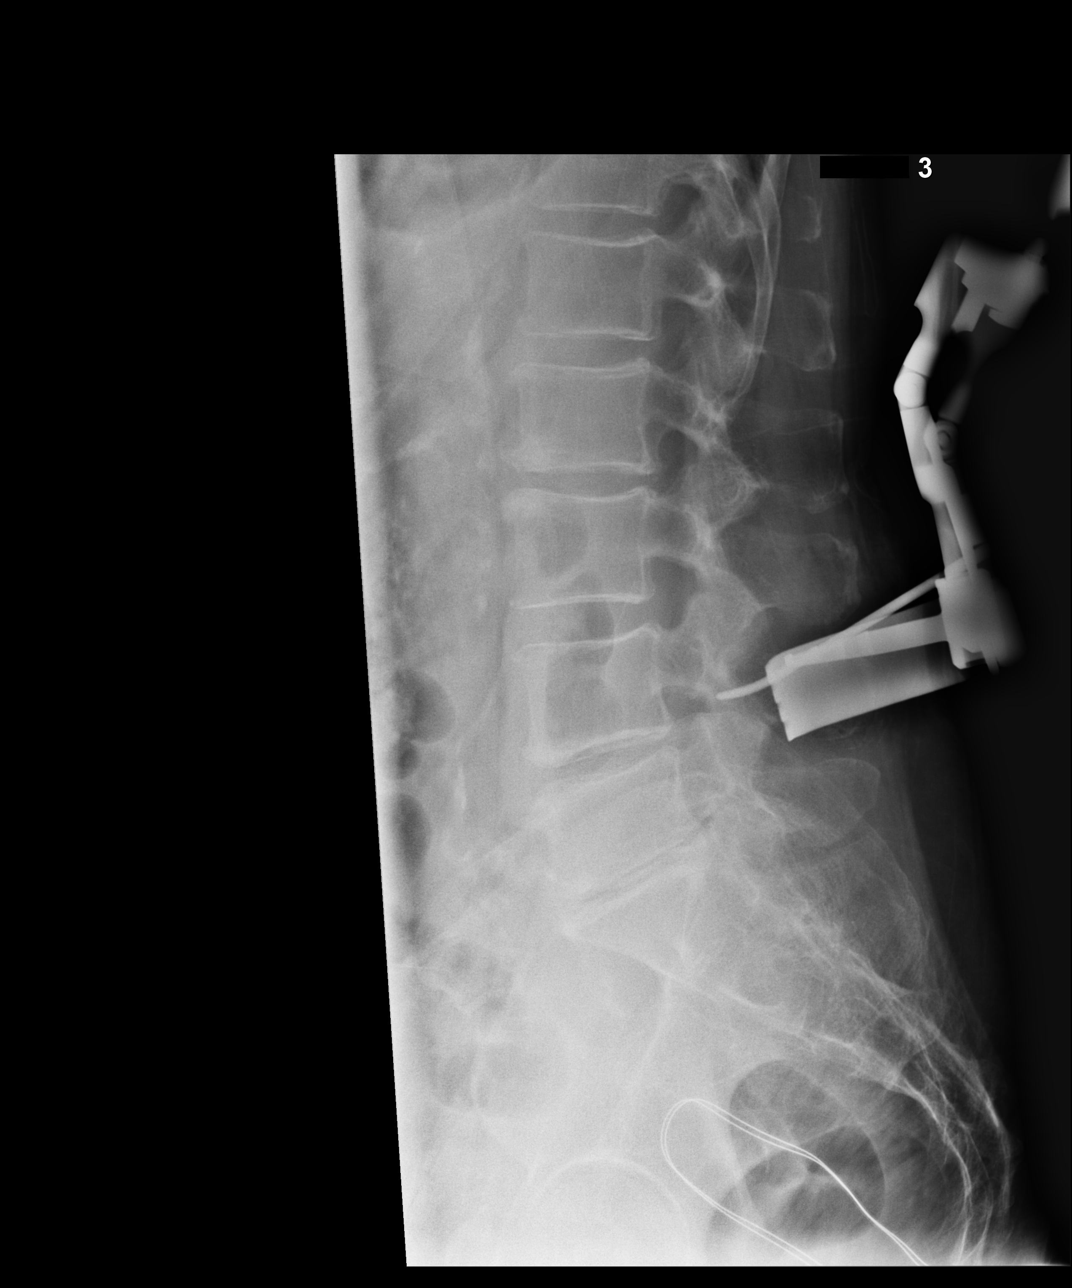

[3 of 3 positions shown; findings below may reference images not displayed]

FINDINGS: Sequential portable cross-table images of the lumbar spine were
obtained for the purposes of intraoperative localization. A pointed
surgical implement is utilized to indicate positioning.

Assuming normal segmentation as indicated on MR imaging:

Image 1 demonstrates the implement along the L3-L4 articular facet.

Image 2 demonstrates the implement at the L3 pars interarticularis.

Image 3 demonstrates the implement at the L4 pars interarticularis.

Surgical retractors and radiodense laparotomy marker noted in the
posterior soft tissues on these sequential images. Cholecystectomy
clips in the right upper quadrant. Multilevel degenerative changes
of the spine as seen on comparison imaging.
IMPRESSION: 1. Sequential localization images as detailed above, assuming normal
segmentation with the lowest disc space considered L5-S1 as utilized
on prior MR imaging.

## 2022-09-05 NOTE — Progress Notes (Unsigned)
  Cardiology Office Note:   Date:  09/08/2022  ID:  Madison [name], DOB [birth year], MRN [id number]  History of Present Illness:   Madison [name] is a 77 y.o. female  who for followup of CAD.  She had a negative Lexiscan Myoview and normal echo in 2018.  Since I last saw her she has continued to be active in her yard and doing housework.  Says she pushes a vacuum cleaner and the only thing that limits her is some back pain.  She does not think this is like previous angina.  I put her on some oral nitrates to see if this would help with that but it did not make a difference.  She denies any chest pressure, neck or arm discomfort.  She has no new shortness of breath, PND or orthopnea.  She has no new palpitations, presyncope or syncope.  She has had no weight gain or edema.  ROS: As stated in the HPI and negative for all other systems.  Studies Reviewed:    EKG:  NA    Risk Assessment/Calculations:     Physical Exam:   VS:  BP (!) 140/80   Pulse 76   Ht 5\' 5"  (1.651 m)   Wt 161 lb (73 kg)   BMI 26.79 kg/m    Wt Readings from Last 3 Encounters:  09/08/22 161 lb (73 kg)  07/02/22 159 lb 12.8 oz (72.5 kg)  06/02/22 160 lb (72.6 kg)     GEN: Well nourished, well developed in no acute distress NECK: No JVD; No carotid bruits CARDIAC: RRR, soft brief left upper sternal border systolic murmur nonradiating, no diastolic murmurs, rubs, gallops RESPIRATORY:  Clear to auscultation without rales, wheezing or rhonchi  ABDOMEN: Soft, non-tender, non-distended EXTREMITIES:  No edema; No deformity   ASSESSMENT AND PLAN:   CORONARY ARTERY DISEASE -  She has had no chest discomfort or other symptoms that she thinks is consistent with angina.   She has not use any nitroglycerin.  She remains physically active.  No change in therapy.   RBBB:      This is chronic.  No change in therapy.   TOBACCO ABUSE -  She is unable to quit smoking we talked about this multiple times in the past.    DYSLIPIDEMIA -  LDL is 64 with an HDL of 25.  No change in therapy.   HTN - The blood pressure is elevated but she reports that it is always in the 120s at home.  No change in therapy.       Signed, Rollene Rotunda, MD

## 2022-09-08 ENCOUNTER — Ambulatory Visit (INDEPENDENT_AMBULATORY_CARE_PROVIDER_SITE_OTHER): Payer: PPO | Admitting: Cardiology

## 2022-09-08 ENCOUNTER — Encounter: Payer: Self-pay | Admitting: Cardiology

## 2022-09-08 VITALS — BP 140/80 | HR 76 | Ht 65.0 in | Wt 161.0 lb

## 2022-09-08 DIAGNOSIS — I2510 Atherosclerotic heart disease of native coronary artery without angina pectoris: Secondary | ICD-10-CM | POA: Diagnosis not present

## 2022-09-08 DIAGNOSIS — I10 Essential (primary) hypertension: Secondary | ICD-10-CM | POA: Diagnosis not present

## 2022-09-08 DIAGNOSIS — I4510 Unspecified right bundle-branch block: Secondary | ICD-10-CM

## 2022-09-08 DIAGNOSIS — E785 Hyperlipidemia, unspecified: Secondary | ICD-10-CM | POA: Diagnosis not present

## 2022-09-08 DIAGNOSIS — Z720 Tobacco use: Secondary | ICD-10-CM

## 2022-09-08 MED ORDER — NITROGLYCERIN 0.4 MG SL SUBL: 0.4000 mg | SUBLINGUAL_TABLET | SUBLINGUAL | 3 refills | Status: AC | PRN

## 2022-09-08 NOTE — Patient Instructions (Signed)
Medication Instructions:  The current medical regimen is effective;  continue present plan and medications.  *If you need a refill on your cardiac medications before your next appointment, please call your pharmacy*  Follow-Up: At [facility], you and your health needs are our priority.  As part of our continuing mission to provide you with exceptional heart care, we have created designated Provider Care Teams.  These Care Teams include your primary Cardiologist (physician) and Advanced Practice Providers (APPs -  Physician Assistants and Nurse Practitioners) who all work together to provide you with the care you need, when you need it.  We recommend signing up for the patient portal called "MyChart".  Sign up information is provided on this After Visit Summary.  MyChart is used to connect with patients for Virtual Visits (Telemedicine).  Patients are able to view lab/test results, encounter notes, upcoming appointments, etc.  Non-urgent messages can be sent to your provider as well.   To learn more about what you can do with MyChart, go to [email or web address].    Your next appointment:   1 year(s)  Provider:   James Hochrein, MD     

## 2022-09-08 NOTE — Addendum Note (Signed)
Addended by: Sharin Grave on: 09/08/2022 02:06 PM   Modules accepted: Orders

## 2022-09-21 ENCOUNTER — Other Ambulatory Visit: Payer: Self-pay | Admitting: Family Medicine

## 2022-09-21 DIAGNOSIS — I10 Essential (primary) hypertension: Secondary | ICD-10-CM

## 2022-09-27 ENCOUNTER — Other Ambulatory Visit: Payer: Self-pay | Admitting: Family Medicine

## 2022-09-27 DIAGNOSIS — E7800 Pure hypercholesterolemia, unspecified: Secondary | ICD-10-CM

## 2022-10-20 ENCOUNTER — Other Ambulatory Visit: Payer: Self-pay | Admitting: Family Medicine

## 2022-10-20 DIAGNOSIS — F411 Generalized anxiety disorder: Secondary | ICD-10-CM

## 2022-10-25 ENCOUNTER — Telehealth: Payer: Self-pay | Admitting: Cardiology

## 2022-10-25 NOTE — Telephone Encounter (Signed)
Patient states swelling since May.  Not bad, but bad enough that bothers.  States feet to little past ankle. No compression stockings.  Elevates at times.  Watches sodium. No SOB. Weighed Saturday and was 161, same last OV.  States good in the morning but progressives during the day. Taking meds as directed.  Please advise

## 2022-10-25 NOTE — Telephone Encounter (Signed)
  Pt c/o swelling: STAT is pt has developed SOB within 24 hours  If swelling, where is the swelling located? Ankles but more in the left than the right  How much weight have you gained and in what time span? no  Have you gained 3 pounds in a day or 5 pounds in a week? no  Do you have a log of your daily weights (if so, list)? no  Are you currently taking a fluid pill? yes  Are you currently SOB? no  Have you traveled recently?  No   Patient states that there was a medication change back at her May appt and she has noticed some swelling since then but it is getting worse

## 2022-10-26 MED ORDER — AMLODIPINE BESYLATE 5 MG PO TABS: 5.0000 mg | ORAL_TABLET | Freq: Every day | ORAL | 3 refills | Status: DC

## 2022-10-26 NOTE — Telephone Encounter (Signed)
Follow Up:     Patient is returning a call from today. 

## 2022-10-26 NOTE — Telephone Encounter (Signed)
Left voicemail message for the pt to call the clinic. 

## 2022-10-26 NOTE — Telephone Encounter (Signed)
  Per the doctor. Could be the Norvasc.  Decrease to 5 mg and she can keep a BP diary and schedule follow up bringing that diary.     Advised patient to half her tablets at this time.  New RX on file for when needs refill.  Appt made with Dr Antoine Poche for August 9th.  She will keep BP log and call if any issues prior to the appointment

## 2022-11-05 DIAGNOSIS — Z4689 Encounter for fitting and adjustment of other specified devices: Secondary | ICD-10-CM | POA: Diagnosis not present

## 2022-11-05 DIAGNOSIS — N8111 Cystocele, midline: Secondary | ICD-10-CM | POA: Diagnosis not present

## 2022-11-05 DIAGNOSIS — N952 Postmenopausal atrophic vaginitis: Secondary | ICD-10-CM | POA: Diagnosis not present

## 2022-11-25 NOTE — Progress Notes (Signed)
  Cardiology Office Note:   Date:  11/26/2022  ID:  Madison [name], DOB [birth year], MRN [id number] PCP: Raliegh Ip, DO  [facility] Providers Cardiologist:  Rollene Rotunda, MD {  History of Present Illness:   Madison [name] is a 77 y.o. female who presents  for followup of CAD.  She had a negative Lexiscan Myoview and normal echo in 2018.  Since I last saw her she has been keeping a blood pressure diary once a day most days for the last couple of weeks.  Her blood pressure seems to be well-controlled.  It was elevated in the office and I tried to increase her amlodipine but she had increased leg swelling we had to cut back down.  She has not had any new cardiovascular symptoms. The patient denies any new symptoms such as chest discomfort, neck or arm discomfort. There has been no new shortness of breath, PND or orthopnea. There have been no reported palpitations, presyncope or syncope.   ROS: As stated in the HPI and negative for all other systems.  Studies Reviewed:    EKG:   NA  Risk Assessment/Calculations:     See below      Physical Exam:   VS:  BP (!) 181/93 (BP Location: Left Arm, Patient Position: Sitting, Cuff Size: Normal)   Pulse 72   Ht 5\' 5"  (1.651 m)   Wt 162 lb (73.5 kg)   SpO2 93%   BMI 26.96 kg/m    Wt Readings from Last 3 Encounters:  11/26/22 162 lb (73.5 kg)  09/08/22 161 lb (73 kg)  07/02/22 159 lb 12.8 oz (72.5 kg)     GEN: Well nourished, well developed in no acute distress NECK: No JVD; No carotid bruits CARDIAC: No RRR, no murmurs, rubs, gallops RESPIRATORY:  Clear to auscultation without rales, wheezing or rhonchi  ABDOMEN: Soft, non-tender, non-distended EXTREMITIES:  No edema; No deformity   ASSESSMENT AND PLAN:   CORONARY ARTERY DISEASE :   The patient has no new sypmtoms.  No further cardiovascular testing is indicated.  We will continue with aggressive risk reduction and meds as listed.  RBBB:      This is chronic.  No  change in therapy.  TOBACCO ABUSE:   She is unable to quit smoking.  DYSLIPIDEMIA:   LDL was 64.  No change in therapy.   HTN :   The blood pressure is elevated today but her diary says it is typically in the 120s over 70s at home.  I am going to have her keep her blood pressure diary for 3 times a day for couple of weeks and review these.   If her blood pressure is not at target uniformly and will change her losartan to losartan HCT.     Follow up with me in one year in [place].   Signed, Rollene Rotunda, MD

## 2022-11-26 ENCOUNTER — Encounter: Payer: Self-pay | Admitting: Cardiology

## 2022-11-26 ENCOUNTER — Ambulatory Visit: Payer: PPO | Attending: Cardiology | Admitting: Cardiology

## 2022-11-26 VITALS — BP 181/93 | HR 72 | Ht 65.0 in | Wt 162.0 lb

## 2022-11-26 DIAGNOSIS — Z720 Tobacco use: Secondary | ICD-10-CM | POA: Diagnosis not present

## 2022-11-26 DIAGNOSIS — E785 Hyperlipidemia, unspecified: Secondary | ICD-10-CM | POA: Diagnosis not present

## 2022-11-26 DIAGNOSIS — I4510 Unspecified right bundle-branch block: Secondary | ICD-10-CM

## 2022-11-26 DIAGNOSIS — I2510 Atherosclerotic heart disease of native coronary artery without angina pectoris: Secondary | ICD-10-CM | POA: Diagnosis not present

## 2022-11-26 DIAGNOSIS — I10 Essential (primary) hypertension: Secondary | ICD-10-CM | POA: Diagnosis not present

## 2022-11-26 NOTE — Patient Instructions (Signed)
Medication Instructions:  No Changes *If you need a refill on your cardiac medications before your next appointment, please call your pharmacy*   Lab Work: No Labs If you have labs (blood work) drawn today and your tests are completely normal, you will receive your results only by: [place] (if you have MyChart) OR A paper copy in the mail If you have any lab test that is abnormal or we need to change your treatment, we will call you to review the results.   Testing/Procedures: No Testing   Follow-Up: At [facility], you and your health needs are our priority.  As part of our continuing mission to provide you with exceptional heart care, we have created designated Provider Care Teams.  These Care Teams include your primary Cardiologist (physician) and Advanced Practice Providers (APPs -  Physician Assistants and Nurse Practitioners) who all work together to provide you with the care you need, when you need it.  We recommend signing up for the patient portal called "MyChart".  Sign up information is provided on this After Visit Summary.  MyChart is used to connect with patients for Virtual Visits (Telemedicine).  Patients are able to view lab/test results, encounter notes, upcoming appointments, etc.  Non-urgent messages can be sent to your provider as well.   To learn more about what you can do with MyChart, go to [email or web address].    Your next appointment:   6 month(s)  Provider:   Minus Breeding, MD

## 2022-11-29 ENCOUNTER — Other Ambulatory Visit: Payer: Self-pay | Admitting: Family Medicine

## 2022-11-29 DIAGNOSIS — I10 Essential (primary) hypertension: Secondary | ICD-10-CM

## 2022-12-03 ENCOUNTER — Encounter: Payer: Self-pay | Admitting: Family Medicine

## 2022-12-03 ENCOUNTER — Ambulatory Visit (INDEPENDENT_AMBULATORY_CARE_PROVIDER_SITE_OTHER): Payer: PPO | Admitting: Family Medicine

## 2022-12-03 VITALS — HR 72 | Temp 98.7°F | Ht 65.0 in | Wt 160.0 lb

## 2022-12-03 DIAGNOSIS — J9801 Acute bronchospasm: Secondary | ICD-10-CM | POA: Diagnosis not present

## 2022-12-03 DIAGNOSIS — J069 Acute upper respiratory infection, unspecified: Secondary | ICD-10-CM

## 2022-12-03 MED ORDER — METHYLPREDNISOLONE ACETATE 40 MG/ML IJ SUSP
40.0000 mg | Freq: Once | INTRAMUSCULAR | Status: DC
Start: 2022-12-03 — End: 2022-12-28

## 2022-12-03 MED ORDER — ALBUTEROL SULFATE HFA 108 (90 BASE) MCG/ACT IN AERS
2.0000 | INHALATION_SPRAY | Freq: Four times a day (QID) | RESPIRATORY_TRACT | 0 refills | Status: DC | PRN
Start: 2022-12-03 — End: 2023-06-29

## 2022-12-03 MED ORDER — DEXAMETHASONE 4 MG PO TABS
4.0000 mg | ORAL_TABLET | Freq: Every morning | ORAL | 0 refills | Status: AC
Start: 2022-12-03 — End: 2022-12-06

## 2022-12-03 MED ORDER — IPRATROPIUM-ALBUTEROL 0.5-2.5 (3) MG/3ML IN SOLN
3.0000 mL | Freq: Once | RESPIRATORY_TRACT | Status: DC
Start: 2022-12-03 — End: 2022-12-28

## 2022-12-03 MED ORDER — MOLNUPIRAVIR EUA 200MG CAPSULE
4.0000 | ORAL_CAPSULE | Freq: Two times a day (BID) | ORAL | 0 refills | Status: AC
Start: 2022-12-03 — End: 2022-12-08

## 2022-12-03 MED ORDER — BENZONATATE 100 MG PO CAPS
100.0000 mg | ORAL_CAPSULE | Freq: Three times a day (TID) | ORAL | 0 refills | Status: DC | PRN
Start: 2022-12-03 — End: 2022-12-28

## 2022-12-03 NOTE — Progress Notes (Signed)
Subjective: CC: Bronchitis PCP: Raliegh Ip, DO NWG:NFAOZHY Madison [name] is a 77 y.o. female presenting to clinic today for:  1.  Bronchitis Patient reports that she had onset of cough, congestion, runny nose and sneezing on Thursday.  She started getting some chest tightness with shortness of breath.  No hemoptysis.  No known sick contacts.  Has been using Tylenol and Zyrtec.   ROS: Per HPI  Allergies  Allergen Reactions   Banana Itching and Swelling    Throat and ears itch,ears feel like they swell   Bupropion Hcl     REACTION: paranoia   Chantix [Varenicline]     paranoia   Latex Rash   Zetia [Ezetimibe]     Myalgia   Past Medical History:  Diagnosis Date   Anxiety    CAD (coronary artery disease)    PCI of an occluded right coronary artery 2001. 70% LAD stenosis, 30% circumflex stenosis.   Diverticulosis    Hyperlipidemia    Hypertension    IBS (irritable bowel syndrome)    Leukocytosis, unspecified 07/24/2012   Myocardial infarction [facility]) 2001   Need for prophylactic hormone replacement therapy (postmenopausal)    Osteopenia    Osteoporosis    PONV (postoperative nausea and vomiting)    Thyroid cancer (HCC) 2004   Dr. Demaris Callander    Thyroid nodule 2004   Tobacco abuse     Current Outpatient Medications:    albuterol (VENTOLIN HFA) 108 (90 Base) MCG/ACT inhaler, Inhale 2 puffs into the lungs every 6 (six) hours as needed for wheezing or shortness of breath., Disp: 8 g, Rfl: 0   amLODipine (NORVASC) 5 MG tablet, Take 1 tablet (5 mg total) by mouth daily., Disp: 90 tablet, Rfl: 3   aspirin EC 81 MG tablet, Take 1 tablet (81 mg total) by mouth daily. Swallow whole., Disp: 30 tablet, Rfl: 11   benzonatate (TESSALON PERLES) 100 MG capsule, Take 1 capsule (100 mg total) by mouth 3 (three) times daily as needed for cough., Disp: 20 capsule, Rfl: 0   busPIRone (BUSPAR) 30 MG tablet, Take 1 tablet (30 mg total) by mouth 2 (two) times daily., Disp: 180 tablet, Rfl: 3    conjugated estrogens (PREMARIN) vaginal cream, Place 1 Applicatorful vaginally 3 (three) times a week. (Patient taking differently: Place 1 Applicatorful vaginally 3 (three) times a week. .625 mg), Disp: 30 g, Rfl: prn   dexamethasone (DECADRON) 4 MG tablet, Take 1 tablet (4 mg total) by mouth in the morning for 3 days. Start 12/04/2022, Disp: 3 tablet, Rfl: 0   fenofibrate 160 MG tablet, TAKE ONE (1) TABLET BY MOUTH EVERY DAY, Disp: 90 tablet, Rfl: 0   levothyroxine (SYNTHROID) 125 MCG tablet, Take 125 mcg by mouth See admin instructions., Disp: , Rfl:    LORazepam (ATIVAN) 1 MG tablet, Take 0.5-1 tablets (0.5-1 mg total) by mouth daily as needed for anxiety., Disp: 30 tablet, Rfl: 1   losartan (COZAAR) 100 MG tablet, TAKE ONE (1) TABLET BY MOUTH EVERY DAY, Disp: 90 tablet, Rfl: 1   Melatonin 5 MG TABS, Take 5 mg by mouth at bedtime as needed (sleep)., Disp: , Rfl:    metoprolol tartrate (LOPRESSOR) 50 MG tablet, TAKE ONE (1) TABLET EACH DAY, Disp: 90 tablet, Rfl: 0   molnupiravir EUA (LAGEVRIO) 200 mg CAPS capsule, Take 4 capsules (800 mg total) by mouth 2 (two) times daily for 5 days., Disp: 40 capsule, Rfl: 0   nitroGLYCERIN (NITROSTAT) 0.4 MG SL tablet, Place 1  tablet (0.4 mg total) under the tongue every 5 (five) minutes as needed for chest pain., Disp: 25 tablet, Rfl: 3   rosuvastatin (CRESTOR) 40 MG tablet, 1 tablet daily (Patient taking differently: Take 40 mg by mouth daily. 1 tablet daily), Disp: 90 tablet, Rfl: 3   Vitamin D, Ergocalciferol, (DRISDOL) 1.25 MG (50000 UNIT) CAPS capsule, TAKE 1 CAPSULE EVERY 7 DAYS, Disp: 12 capsule, Rfl: 1  Current Facility-Administered Medications:    ipratropium-albuterol (DUONEB) 0.5-2.5 (3) MG/3ML nebulizer solution 3 mL, 3 mL, Nebulization, Once,    methylPREDNISolone acetate (DEPO-MEDROL) injection 40 mg, 40 mg, Intramuscular, Once,  Social History   Socioeconomic History   Marital status: Married    Spouse name: Not on file   Number of children:  2   Years of education: Not on file   Highest education level: Not on file  Occupational History   Occupation: Retired     Comment: Optometrist  Tobacco Use   Smoking status: Every Day    Current packs/day: 1.00    Average packs/day: 1 pack/day for 40.0 years (40.0 ttl pk-yrs)    Types: Cigarettes   Smokeless tobacco: Never  Vaping Use   Vaping status: Never Used  Substance and Sexual Activity   Alcohol use: No   Drug use: No   Sexual activity: Not Currently  Other Topics Concern   Not on file  Social History Narrative   Dr.  Alyse Low -(surgeon) [phone]   Dr. Jeraldine Loots- Endocrinologist  [facility]    Social Determinants of Health   Financial Resource Strain: Low Risk  (03/31/2022)   Overall Financial Resource Strain (CARDIA)    Difficulty of Paying Living Expenses: Not hard at all  Food Insecurity: No Food Insecurity (03/31/2022)   Hunger Vital Sign    Worried About Running Out of Food in the Last Year: Never true    Ran Out of Food in the Last Year: Never true  Transportation Needs: No Transportation Needs (03/31/2022)   PRAPARE - Administrator, Civil Service (Medical): No    Lack of Transportation (Non-Medical): No  Physical Activity: Insufficiently Active (03/31/2022)   Exercise Vital Sign    Days of Exercise per Week: 3 days    Minutes of Exercise per Session: 30 min  Stress: No Stress Concern Present (03/31/2022)   [employer] of Occupational Health - Occupational Stress Questionnaire    Feeling of Stress : Not at all  Social Connections: Moderately Isolated (03/31/2022)   Social Connection and Isolation Panel [NHANES]    Frequency of Communication with Friends and Family: More than three times a week    Frequency of Social Gatherings with Friends and Family: Once a week    Attends Religious Services: Never    Database administrator or Organizations: No    Attends Banker Meetings: Never    Marital Status: Married  Careers information officer Violence: Not At Risk (03/31/2022)   Humiliation, Afraid, Rape, and Kick questionnaire    Fear of Current or Ex-Partner: No    Emotionally Abused: No    Physically Abused: No    Sexually Abused: No   Family History  Problem Relation Age of Onset   Coronary artery disease Father    Heart disease Father    Cirrhosis Mother    Alcohol abuse Mother    Cancer Other        family history    Hypertension Maternal Grandmother     Objective: Office vital signs reviewed.  Pulse 72   Temp 98.7 F (37.1 C)   Ht 5\' 5"  (1.651 m)   Wt 160 lb (72.6 kg)   SpO2 93%   BMI 26.63 kg/m   Physical Examination:  General: Awake, alert, nontoxic female HEENT: sclera white, MMM.  TMs intact bilaterally with normal light reflex.  Oropharynx with mild cobblestone appearance but no exudates.  No ulcerations. Cardio: regular rate and rhythm, S1S2 heard, no murmurs appreciated Pulm: Mildly reduced breath sounds with no wheezes, rhonchi or rales.  No coughing observed or dyspnea with speech.  Assessment/ Plan: 77 y.o. female   URI with cough and congestion - Plan: molnupiravir EUA (LAGEVRIO) 200 mg CAPS capsule, dexamethasone (DECADRON) 4 MG tablet, benzonatate (TESSALON PERLES) 100 MG capsule, methylPREDNISolone acetate (DEPO-MEDROL) injection 40 mg, ipratropium-albuterol (DUONEB) 0.5-2.5 (3) MG/3ML nebulizer solution 3 mL, Novel Coronavirus, NAA ([facility]), CANCELED: Novel Coronavirus, NAA ([facility])  Bronchospasm - Plan: molnupiravir EUA (LAGEVRIO) 200 mg CAPS capsule, methylPREDNISolone acetate (DEPO-MEDROL) injection 40 mg, albuterol (VENTOLIN HFA) 108 (90 Base) MCG/ACT inhaler, ipratropium-albuterol (DUONEB) 0.5-2.5 (3) MG/3ML nebulizer solution 3 mL, Novel Coronavirus, NAA ([facility]), CANCELED: Novel Coronavirus, NAA ([facility])  Empiric treatment for presumed COVID-19.  Not a candidate for Paxlovid given multiple drug interactions.  Corticosteroid administered intramuscularly and oral will start  tomorrow.  DuoNeb prescribed and this did seem to symptomatically help her today so I have sent over an albuterol inhaler as well for her.  May use as needed.  Cough medication.  Test for COVID-19 and we will follow-up with results once available.  Discussed red flag signs symptoms warranting further evaluation.  She voiced understanding will follow-up as needed   Raliegh Ip, DO [facility] [phone]

## 2022-12-03 NOTE — Patient Instructions (Signed)

## 2022-12-06 LAB — NOVEL CORONAVIRUS, NAA: SARS-CoV-2, NAA: DETECTED — AB

## 2022-12-07 ENCOUNTER — Other Ambulatory Visit: Payer: Self-pay | Admitting: Family Medicine

## 2022-12-08 ENCOUNTER — Telehealth: Payer: Self-pay | Admitting: Family Medicine

## 2022-12-08 NOTE — Telephone Encounter (Signed)
She has COVID19.  Sadly her symptoms can last for WEEKS following infection.  This is TYPICAL.  If she is coughing up brown or bloody sputum, having shortness of breath or new wheezing, she needs to be seen in office as this would warrant CXR/ breathing treatments/ etc.

## 2022-12-08 NOTE — Telephone Encounter (Signed)
Got pt apt with DOD made

## 2022-12-08 NOTE — Telephone Encounter (Signed)
  Incoming Patient Call  12/08/2022  What symptoms do you have? Tired and drained. Coughing up yellow phlegm and has finished antibiotic.   How long have you been sick? 8-15  Have you been seen for this problem? 8-16 with Dr. Reece Agar  If your provider decides to give you a prescription, which pharmacy would you like for it to be sent [facility]  Patient informed that this information will be sent to the clinical staff for review and that they should receive a follow up call.

## 2022-12-09 ENCOUNTER — Ambulatory Visit (INDEPENDENT_AMBULATORY_CARE_PROVIDER_SITE_OTHER): Payer: PPO

## 2022-12-09 ENCOUNTER — Ambulatory Visit (INDEPENDENT_AMBULATORY_CARE_PROVIDER_SITE_OTHER): Payer: PPO | Admitting: Nurse Practitioner

## 2022-12-09 ENCOUNTER — Encounter: Payer: Self-pay | Admitting: Nurse Practitioner

## 2022-12-09 VITALS — BP 163/82 | HR 75 | Temp 97.6°F | Resp 20 | Ht 65.0 in | Wt 156.0 lb

## 2022-12-09 DIAGNOSIS — J449 Chronic obstructive pulmonary disease, unspecified: Secondary | ICD-10-CM | POA: Diagnosis not present

## 2022-12-09 DIAGNOSIS — I700 Atherosclerosis of aorta: Secondary | ICD-10-CM | POA: Diagnosis not present

## 2022-12-09 DIAGNOSIS — R053 Chronic cough: Secondary | ICD-10-CM

## 2022-12-09 DIAGNOSIS — U099 Post covid-19 condition, unspecified: Secondary | ICD-10-CM | POA: Diagnosis not present

## 2022-12-09 DIAGNOSIS — U071 COVID-19: Secondary | ICD-10-CM | POA: Diagnosis not present

## 2022-12-09 DIAGNOSIS — R059 Cough, unspecified: Secondary | ICD-10-CM | POA: Diagnosis not present

## 2022-12-09 MED ORDER — AZITHROMYCIN 250 MG PO TABS: ORAL_TABLET | ORAL | 0 refills | Status: DC

## 2022-12-09 MED ORDER — PROMETHAZINE-DM 6.25-15 MG/5ML PO SYRP
5.0000 mL | ORAL_SOLUTION | Freq: Four times a day (QID) | ORAL | 0 refills | Status: DC | PRN
Start: 2022-12-09 — End: 2022-12-28

## 2022-12-09 NOTE — Patient Instructions (Signed)

## 2022-12-09 NOTE — Progress Notes (Signed)
Subjective:    Patient ID: Madison [name], female    DOB: 1946/04/02, 77 y.o.   MRN: [id number]   Chief Complaint: Yellow phlegm (Positive for Covid last Friday)   HPI  Patient tested positive for covid last Friday. She was treated with steroids and duneb. Was not a candidate for paxlovid. She says she is better except her phlegm has gone from clear to a yellowish color. Patient Active Problem List   Diagnosis Date Noted   Recurrent herniation of lumbar disc 01/08/2021   Status post lumbar microdiscectomy 06/26/2020   Herniated intervertebral disc of lumbar spine 03/20/2020   Lumbago with sciatica, left side 01/22/2020   Acute left-sided low back pain with left-sided sciatica 01/08/2020   Educated about COVID-19 virus infection 03/19/2019   Coronary artery disease involving native coronary artery of native heart without angina pectoris 03/08/2018   Dyslipidemia 03/08/2018   Midline cystocele 12/20/2017   Uterine prolapse 12/20/2017   Vaginal atrophy 12/20/2017   GAD (generalized anxiety disorder) 05/19/2017   Chest pain 02/23/2017   RBBB 02/23/2017   Dermatofibroma 06/24/2016   Actinic keratosis 06/24/2016   Skin tag 06/24/2016   Post-surgical hypothyroidism 05/09/2014   Metabolic syndrome 05/15/2013   Leukocytosis 01/04/2013   TOBACCO ABUSE 10/29/2009   THYROID CANCER 11/30/2007   Hyperlipidemia 11/30/2007   HTN (hypertension) 11/30/2007   Coronary atherosclerosis 11/30/2007   IRRITABLE BOWEL SYNDROME 11/30/2007   Osteopenia 11/30/2007       Review of Systems  Constitutional:  Negative for chills and fever.  HENT:  Positive for congestion.   Respiratory:  Positive for cough and wheezing.        Objective:   Physical Exam Constitutional:      Appearance: Normal appearance.  Cardiovascular:     Rate and Rhythm: Normal rate and regular rhythm.     Heart sounds: Normal heart sounds.  Pulmonary:     Effort: Pulmonary effort is normal.     Breath sounds:  Wheezing (exp wheezes bil upper lobes) present.  Skin:    General: Skin is warm.  Neurological:     General: No focal deficit present.     Mental Status: She is alert and oriented to person, place, and time.  Psychiatric:        Mood and Affect: Mood normal.        Behavior: Behavior normal.     BP (!) 163/82   Pulse 75   Temp 97.6 F (36.4 C) (Temporal)   Resp 20   Ht 5\' 5"  (1.651 m)   Wt 156 lb (70.8 kg)   SpO2 92%   BMI 25.96 kg/m   Xray- early thickening right middle lobe- Preliminary reading by Paulene Floor, FNP  [facility]       Assessment & Plan:   Reagann H Depinto in today with chief complaint of Yellow phlegm (Positive for Covid last Friday)   1. Post-COVID chronic cough Possible early pneumonia- will treat with zithromax Rest Force fluids Quarantine a couple more days - DG Chest 2 View  Meds ordered this encounter  Medications   azithromycin (ZITHROMAX Z-PAK) 250 MG tablet    Sig: As directed    Dispense:  6 tablet    Refill:  0    Order Specific Question:   Supervising Provider    Answer:   Arville Care A [[id number]]   promethazine-dextromethorphan (PROMETHAZINE-DM) 6.25-15 MG/5ML syrup    Sig: Take 5 mLs by mouth 4 (four) times daily as needed for cough.  Dispense:  118 mL    Refill:  0    Order Specific Question:   Supervising Provider    Answer:   Arville Care A [[id number]]     The above assessment and management plan was discussed with the patient. The patient verbalized understanding of and has agreed to the management plan. Patient is aware to call the clinic if symptoms persist or worsen. Patient is aware when to return to the clinic for a follow-up visit. Patient educated on when it is appropriate to go to the emergency department.   Mary-Margaret Daphine Deutscher, FNP

## 2022-12-28 ENCOUNTER — Encounter: Payer: Self-pay | Admitting: Family Medicine

## 2022-12-28 ENCOUNTER — Ambulatory Visit (INDEPENDENT_AMBULATORY_CARE_PROVIDER_SITE_OTHER): Payer: PPO | Admitting: Family Medicine

## 2022-12-28 VITALS — BP 140/62 | HR 68 | Temp 98.5°F | Ht 65.0 in | Wt 155.6 lb

## 2022-12-28 DIAGNOSIS — Z6379 Other stressful life events affecting family and household: Secondary | ICD-10-CM

## 2022-12-28 DIAGNOSIS — Z79899 Other long term (current) drug therapy: Secondary | ICD-10-CM | POA: Diagnosis not present

## 2022-12-28 DIAGNOSIS — I10 Essential (primary) hypertension: Secondary | ICD-10-CM | POA: Diagnosis not present

## 2022-12-28 DIAGNOSIS — F411 Generalized anxiety disorder: Secondary | ICD-10-CM | POA: Diagnosis not present

## 2022-12-28 DIAGNOSIS — F329 Major depressive disorder, single episode, unspecified: Secondary | ICD-10-CM

## 2022-12-28 DIAGNOSIS — E7800 Pure hypercholesterolemia, unspecified: Secondary | ICD-10-CM

## 2022-12-28 MED ORDER — LORAZEPAM 1 MG PO TABS: 0.5000 mg | ORAL_TABLET | Freq: Every day | ORAL | 1 refills | Status: DC | PRN

## 2022-12-28 MED ORDER — DESVENLAFAXINE SUCCINATE ER 50 MG PO TB24: 50.0000 mg | ORAL_TABLET | Freq: Every day | ORAL | 0 refills | Status: DC

## 2022-12-28 MED ORDER — ROSUVASTATIN CALCIUM 40 MG PO TABS
40.0000 mg | ORAL_TABLET | Freq: Every day | ORAL | 3 refills | Status: DC
Start: 2022-12-28 — End: 2023-09-13

## 2022-12-28 MED ORDER — FENOFIBRATE 160 MG PO TABS
160.0000 mg | ORAL_TABLET | Freq: Every day | ORAL | 3 refills | Status: DC
Start: 2022-12-28 — End: 2023-09-13

## 2022-12-28 MED ORDER — METOPROLOL TARTRATE 50 MG PO TABS
ORAL_TABLET | ORAL | 3 refills | Status: DC
Start: 2022-12-28 — End: 2023-09-13

## 2022-12-28 MED ORDER — BUSPIRONE HCL 30 MG PO TABS
30.0000 mg | ORAL_TABLET | Freq: Two times a day (BID) | ORAL | 3 refills | Status: DC
Start: 2022-12-28 — End: 2023-03-11

## 2022-12-28 MED ORDER — LOSARTAN POTASSIUM 100 MG PO TABS
100.0000 mg | ORAL_TABLET | Freq: Every day | ORAL | 3 refills | Status: DC
Start: 2022-12-28 — End: 2023-09-13

## 2022-12-28 NOTE — Progress Notes (Signed)
Subjective: CC: Chronic follow-up PCP: Raliegh Ip, DO [id number] H Swider is a 77 y.o. female presenting to clinic today for:  1.  Anxiety, depression, insomnia Patient reports that she continues to worry about her husband Jake Shark.  They had to reschedule his neuropsychology appointment and will not be able to get him seen for several months.  This worries her of course.  She is recovering from recent COVID infection.  Overall she seems to be recovering well after being treated for secondary bacterial infection with azithromycin.  She reports that depressive symptoms seem to be a little worse as well and she wants to address this today as she is compliant with buspirone 30 mg twice daily and using the Ativan a bit more regularly now for sleep.  2.  Hypertension with hyperlipidemia Will be seeing cardiology soon.  She is compliant with her medications.  Blood pressures at home are typically running 120s systolic to 130 systolic.  No chest pain, shortness of breath, dizziness reported   ROS: Per HPI  Allergies  Allergen Reactions   Banana Itching and Swelling    Throat and ears itch,ears feel like they swell   Bupropion Hcl     REACTION: paranoia   Chantix [Varenicline]     paranoia   Latex Rash   Zetia [Ezetimibe]     Myalgia   Past Medical History:  Diagnosis Date   Anxiety    CAD (coronary artery disease)    PCI of an occluded right coronary artery 2001. 70% LAD stenosis, 30% circumflex stenosis.   Diverticulosis    Hyperlipidemia    Hypertension    IBS (irritable bowel syndrome)    Leukocytosis 01/04/2013   This is followed by the hematologist     Leukocytosis, unspecified 07/24/2012   Myocardial infarction [facility]) 2001   Need for prophylactic hormone replacement therapy (postmenopausal)    Osteopenia    Osteoporosis    PONV (postoperative nausea and vomiting)    Thyroid cancer (HCC) 2004   Dr. Demaris Callander    Thyroid nodule 2004   Tobacco abuse     Current  Outpatient Medications:    albuterol (VENTOLIN HFA) 108 (90 Base) MCG/ACT inhaler, Inhale 2 puffs into the lungs every 6 (six) hours as needed for wheezing or shortness of breath., Disp: 8 g, Rfl: 0   amLODipine (NORVASC) 5 MG tablet, Take 1 tablet (5 mg total) by mouth daily., Disp: 90 tablet, Rfl: 3   aspirin EC 81 MG tablet, Take 1 tablet (81 mg total) by mouth daily. Swallow whole., Disp: 30 tablet, Rfl: 11   busPIRone (BUSPAR) 30 MG tablet, Take 1 tablet (30 mg total) by mouth 2 (two) times daily., Disp: 180 tablet, Rfl: 3   conjugated estrogens (PREMARIN) vaginal cream, Place 1 Applicatorful vaginally 3 (three) times a week. (Patient taking differently: Place 1 Applicatorful vaginally 3 (three) times a week. .625 mg), Disp: 30 g, Rfl: prn   fenofibrate 160 MG tablet, TAKE ONE (1) TABLET BY MOUTH EVERY DAY, Disp: 90 tablet, Rfl: 0   levothyroxine (SYNTHROID) 125 MCG tablet, Take 125 mcg by mouth See admin instructions., Disp: , Rfl:    LORazepam (ATIVAN) 1 MG tablet, Take 0.5-1 tablets (0.5-1 mg total) by mouth daily as needed for anxiety., Disp: 30 tablet, Rfl: 1   losartan (COZAAR) 100 MG tablet, TAKE ONE (1) TABLET BY MOUTH EVERY DAY, Disp: 90 tablet, Rfl: 1   Melatonin 5 MG TABS, Take 5 mg by mouth at bedtime as needed (  sleep)., Disp: , Rfl:    metoprolol tartrate (LOPRESSOR) 50 MG tablet, TAKE ONE (1) TABLET EACH DAY, Disp: 90 tablet, Rfl: 0   nitroGLYCERIN (NITROSTAT) 0.4 MG SL tablet, Place 1 tablet (0.4 mg total) under the tongue every 5 (five) minutes as needed for chest pain., Disp: 25 tablet, Rfl: 3   promethazine-dextromethorphan (PROMETHAZINE-DM) 6.25-15 MG/5ML syrup, Take 5 mLs by mouth 4 (four) times daily as needed for cough., Disp: 118 mL, Rfl: 0   rosuvastatin (CRESTOR) 40 MG tablet, 1 tablet daily (Patient taking differently: Take 40 mg by mouth daily. 1 tablet daily), Disp: 90 tablet, Rfl: 3   Vitamin D, Ergocalciferol, (DRISDOL) 1.25 MG (50000 UNIT) CAPS capsule, TAKE 1  CAPSULE EVERY 7 DAYS, Disp: 12 capsule, Rfl: 1 Social History   Socioeconomic History   Marital status: Married    Spouse name: Not on file   Number of children: 2   Years of education: Not on file   Highest education level: Not on file  Occupational History   Occupation: Retired     Comment: Optometrist  Tobacco Use   Smoking status: Every Day    Current packs/day: 1.00    Average packs/day: 1 pack/day for 40.0 years (40.0 ttl pk-yrs)    Types: Cigarettes   Smokeless tobacco: Never  Vaping Use   Vaping status: Never Used  Substance and Sexual Activity   Alcohol use: No   Drug use: No   Sexual activity: Not Currently  Other Topics Concern   Not on file  Social History Narrative   Dr.  Alyse Low -(surgeon) [phone]   Dr. Jeraldine Loots- Endocrinologist  [facility]    Social Determinants of Health   Financial Resource Strain: Low Risk  (03/31/2022)   Overall Financial Resource Strain (CARDIA)    Difficulty of Paying Living Expenses: Not hard at all  Food Insecurity: No Food Insecurity (03/31/2022)   Hunger Vital Sign    Worried About Running Out of Food in the Last Year: Never true    Ran Out of Food in the Last Year: Never true  Transportation Needs: No Transportation Needs (03/31/2022)   PRAPARE - Administrator, Civil Service (Medical): No    Lack of Transportation (Non-Medical): No  Physical Activity: Insufficiently Active (03/31/2022)   Exercise Vital Sign    Days of Exercise per Week: 3 days    Minutes of Exercise per Session: 30 min  Stress: No Stress Concern Present (03/31/2022)   [employer] of Occupational Health - Occupational Stress Questionnaire    Feeling of Stress : Not at all  Social Connections: Moderately Isolated (03/31/2022)   Social Connection and Isolation Panel [NHANES]    Frequency of Communication with Friends and Family: More than three times a week    Frequency of Social Gatherings with Friends and Family: Once a week     Attends Religious Services: Never    Database administrator or Organizations: No    Attends Banker Meetings: Never    Marital Status: Married  Catering manager Violence: Not At Risk (03/31/2022)   Humiliation, Afraid, Rape, and Kick questionnaire    Fear of Current or Ex-Partner: No    Emotionally Abused: No    Physically Abused: No    Sexually Abused: No   Family History  Problem Relation Age of Onset   Coronary artery disease Father    Heart disease Father    Cirrhosis Mother    Alcohol abuse Mother    Cancer  Other        family history    Hypertension Maternal Grandmother     Objective: Office vital signs reviewed. BP (!) 140/62   Pulse 68   Temp 98.5 F (36.9 C)   Ht 5\' 5"  (1.651 m)   Wt 155 lb 9.6 oz (70.6 kg)   SpO2 94%   BMI 25.89 kg/m   Physical Examination:  General: Awake, alert, well nourished, No acute distress HEENT: sclera white, mmm Cardio: regular rate and rhythm, S1S2 heard, no murmurs appreciated Pulm: clear to auscultation bilaterally, no wheezes, rhonchi or rales; normal work of breathing on room air     12/28/2022    1:57 PM 12/09/2022   11:49 AM 12/03/2022    4:09 PM 07/02/2022    9:24 AM 03/31/2022    1:23 PM  Depression screen PHQ 2/9  Decreased Interest 0 0 0 0 0  Down, Depressed, Hopeless 1 0 0 0 0  PHQ - 2 Score 1 0 0 0 0  Altered sleeping 1 1 0 0   Tired, decreased energy 1 1 0 0   Change in appetite 1 1 0 0   Feeling bad or failure about yourself  0 0 0 0   Trouble concentrating 0 0 0 0   Moving slowly or fidgety/restless 0 0 0 0   Suicidal thoughts 0 0 0 0   PHQ-9 Score 4 3 0 0   Difficult doing work/chores Somewhat difficult Not difficult at all  Not difficult at all       12/28/2022    1:52 PM 12/09/2022   11:49 AM 12/03/2022    4:10 PM 07/02/2022    9:19 AM  GAD 7 : Generalized Anxiety Score  Nervous, Anxious, on Edge 3 1 0 0  Control/stop worrying 1 0 0 0  Worry too much - different things 1 0 0 0  Trouble  relaxing 1 0 0 0  Restless 0 0 0 0  Easily annoyed or irritable 0 0 0 0  Afraid - awful might happen 0 0 0 0  Total GAD 7 Score 6 1 0 0  Anxiety Difficulty Somewhat difficult Not difficult at all Not difficult at all Not difficult at all      Assessment/ Plan: 77 y.o. female   GAD (generalized anxiety disorder) - Plan: ToxASSURE Select 13 (MW), Urine, TSH, T4, Free, CMP14+EGFR, busPIRone (BUSPAR) 30 MG tablet, LORazepam (ATIVAN) 1 MG tablet, desvenlafaxine (PRISTIQ) 50 MG 24 hr tablet  Stress due to illness of family member - Plan: Economist 13 (MW), Urine  Controlled substance agreement signed - Plan: ToxASSURE Select 13 (MW), Urine  Pure hypercholesterolemia - Plan: fenofibrate 160 MG tablet, rosuvastatin (CRESTOR) 40 MG tablet  Essential hypertension - Plan: losartan (COZAAR) 100 MG tablet, metoprolol tartrate (LOPRESSOR) 50 MG tablet  Reactive depression - Plan: desvenlafaxine (PRISTIQ) 50 MG 24 hr tablet  UDS and CSC were updated as per office policy.  Trial of Pristiq given reactive depression.  Ativan renewed for ongoing as needed use.  I am okay with her weaning from both Ativan and buspirone if she finds that the Pristiq has adequate effect such that her anxiety is well-controlled.  Discussed ways to wean  I have renewed her cholesterol medications.  She was not yet due for fasting lipid or CMP.  Blood pressure is at goal upon recheck.   Raliegh Ip, DO [facility] [phone]

## 2022-12-29 ENCOUNTER — Other Ambulatory Visit: Payer: Self-pay | Admitting: *Deleted

## 2022-12-29 LAB — TSH: TSH: 0.173 u[IU]/mL — ABNORMAL LOW (ref 0.450–4.500)

## 2022-12-29 LAB — CMP14+EGFR
ALT: 22 IU/L (ref 0–32)
AST: 24 IU/L (ref 0–40)
Albumin: 4.2 g/dL (ref 3.8–4.8)
Alkaline Phosphatase: 54 IU/L (ref 44–121)
BUN/Creatinine Ratio: 29 — ABNORMAL HIGH (ref 12–28)
BUN: 19 mg/dL (ref 8–27)
Bilirubin Total: 0.3 mg/dL (ref 0.0–1.2)
CO2: 25 mmol/L (ref 20–29)
Calcium: 10.1 mg/dL (ref 8.7–10.3)
Chloride: 103 mmol/L (ref 96–106)
Creatinine, Ser: 0.66 mg/dL (ref 0.57–1.00)
Globulin, Total: 2.5 g/dL (ref 1.5–4.5)
Glucose: 111 mg/dL — ABNORMAL HIGH (ref 70–99)
Potassium: 3.4 mmol/L — ABNORMAL LOW (ref 3.5–5.2)
Sodium: 142 mmol/L (ref 134–144)
Total Protein: 6.7 g/dL (ref 6.0–8.5)
eGFR: 91 mL/min/{1.73_m2} (ref >59)

## 2022-12-29 LAB — T4, FREE: Free T4: 2.41 ng/dL — ABNORMAL HIGH (ref 0.82–1.77)

## 2022-12-29 MED ORDER — AMLODIPINE BESYLATE 5 MG PO TABS: 5.0000 mg | ORAL_TABLET | Freq: Every day | ORAL | 3 refills | Status: DC

## 2023-01-04 ENCOUNTER — Ambulatory Visit: Payer: Self-pay

## 2023-01-04 LAB — TOXASSURE SELECT 13 (MW), URINE

## 2023-01-04 NOTE — Progress Notes (Signed)
Patient does check blood pressure at home and last office with her cardiologist she brought them a list of her readings.  Last blood pressure check was 126/67.

## 2023-03-07 DIAGNOSIS — C73 Malignant neoplasm of thyroid gland: Secondary | ICD-10-CM | POA: Diagnosis not present

## 2023-03-11 ENCOUNTER — Ambulatory Visit (INDEPENDENT_AMBULATORY_CARE_PROVIDER_SITE_OTHER): Payer: PPO | Admitting: Family Medicine

## 2023-03-11 ENCOUNTER — Encounter: Payer: Self-pay | Admitting: Family Medicine

## 2023-03-11 VITALS — BP 157/81 | HR 71 | Temp 98.4°F | Ht 65.0 in | Wt 166.0 lb

## 2023-03-11 DIAGNOSIS — Z6379 Other stressful life events affecting family and household: Secondary | ICD-10-CM | POA: Diagnosis not present

## 2023-03-11 DIAGNOSIS — F411 Generalized anxiety disorder: Secondary | ICD-10-CM

## 2023-03-11 DIAGNOSIS — F329 Major depressive disorder, single episode, unspecified: Secondary | ICD-10-CM | POA: Diagnosis not present

## 2023-03-11 MED ORDER — DESVENLAFAXINE SUCCINATE ER 50 MG PO TB24
50.0000 mg | ORAL_TABLET | Freq: Every day | ORAL | 3 refills | Status: DC
Start: 2023-03-11 — End: 2023-09-13

## 2023-03-11 NOTE — Progress Notes (Signed)
Subjective: CC:GAD/depression PCP: Madison Ip, DO Madison [name] is a 77 y.o. female presenting to clinic today for:  1. GAD/depression Pristiq added last visit.  Advised to use Ativan SPARINGLY given risks associated with med.  She notes that Pristiq has done wonders and she feels so much better.  She has totally weaned from BuSpar and has not used Ativan in several weeks now.  She feels fantastic and is very pleased with how things are going.  The only hiccup she has had is that she is much hungrier on this medication and has subsequently gained a little weight.  This is not deterring her from staying on the medication but wanted to make note   ROS: Per HPI  Allergies  Allergen Reactions   Banana Itching and Swelling    Throat and ears itch,ears feel like they swell   Bupropion Hcl     REACTION: paranoia   Chantix [Varenicline]     paranoia   Latex Rash   Zetia [Ezetimibe]     Myalgia   Past Medical History:  Diagnosis Date   Anxiety    CAD (coronary artery disease)    PCI of an occluded right coronary artery 2001. 70% LAD stenosis, 30% circumflex stenosis.   Diverticulosis    Hyperlipidemia    Hypertension    IBS (irritable bowel syndrome)    Leukocytosis 01/04/2013   This is followed by the hematologist     Leukocytosis, unspecified 07/24/2012   Myocardial infarction [facility]) 2001   Need for prophylactic hormone replacement therapy (postmenopausal)    Osteopenia    Osteoporosis    PONV (postoperative nausea and vomiting)    Thyroid cancer (HCC) 2004   Dr. Demaris Callander    Thyroid nodule 2004   Tobacco abuse     Current Outpatient Medications:    albuterol (VENTOLIN HFA) 108 (90 Base) MCG/ACT inhaler, Inhale 2 puffs into the lungs every 6 (six) hours as needed for wheezing or shortness of breath., Disp: 8 g, Rfl: 0   amLODipine (NORVASC) 5 MG tablet, Take 1 tablet (5 mg total) by mouth daily., Disp: 90 tablet, Rfl: 3   aspirin EC 81 MG tablet, Take 1 tablet  (81 mg total) by mouth daily. Swallow whole., Disp: 30 tablet, Rfl: 11   busPIRone (BUSPAR) 30 MG tablet, Take 1 tablet (30 mg total) by mouth 2 (two) times daily., Disp: 180 tablet, Rfl: 3   conjugated estrogens (PREMARIN) vaginal cream, Place 1 Applicatorful vaginally 3 (three) times a week. (Patient taking differently: Place 1 Applicatorful vaginally 3 (three) times a week. .625 mg), Disp: 30 g, Rfl: prn   desvenlafaxine (PRISTIQ) 50 MG 24 hr tablet, Take 1 tablet (50 mg total) by mouth daily., Disp: 90 tablet, Rfl: 0   fenofibrate 160 MG tablet, Take 1 tablet (160 mg total) by mouth daily., Disp: 90 tablet, Rfl: 3   levothyroxine (SYNTHROID) 125 MCG tablet, Take 125 mcg by mouth See admin instructions., Disp: , Rfl:    LORazepam (ATIVAN) 1 MG tablet, Take 0.5-1 tablets (0.5-1 mg total) by mouth daily as needed for anxiety., Disp: 30 tablet, Rfl: 1   losartan (COZAAR) 100 MG tablet, Take 1 tablet (100 mg total) by mouth daily., Disp: 90 tablet, Rfl: 3   Melatonin 5 MG TABS, Take 5 mg by mouth at bedtime as needed (sleep)., Disp: , Rfl:    metoprolol tartrate (LOPRESSOR) 50 MG tablet, TAKE ONE (1) TABLET EACH DAY, Disp: 90 tablet, Rfl: 3   nitroGLYCERIN (  NITROSTAT) 0.4 MG SL tablet, Place 1 tablet (0.4 mg total) under the tongue every 5 (five) minutes as needed for chest pain., Disp: 25 tablet, Rfl: 3   rosuvastatin (CRESTOR) 40 MG tablet, Take 1 tablet (40 mg total) by mouth daily. 1 tablet daily, Disp: 90 tablet, Rfl: 3   Vitamin D, Ergocalciferol, (DRISDOL) 1.25 MG (50000 UNIT) CAPS capsule, TAKE 1 CAPSULE EVERY 7 DAYS, Disp: 12 capsule, Rfl: 1 Social History   Socioeconomic History   Marital status: Married    Spouse name: Not on file   Number of children: 2   Years of education: Not on file   Highest education level: Not on file  Occupational History   Occupation: Retired     Comment: Optometrist  Tobacco Use   Smoking status: Every Day    Current packs/day: 1.00    Average  packs/day: 1 pack/day for 40.0 years (40.0 ttl pk-yrs)    Types: Cigarettes   Smokeless tobacco: Never  Vaping Use   Vaping status: Never Used  Substance and Sexual Activity   Alcohol use: No   Drug use: No   Sexual activity: Not Currently  Other Topics Concern   Not on file  Social History Narrative   Dr.  Alyse [name] -(surgeon) [phone]   Dr. Jeraldine [name]- Endocrinologist  [facility]    Social Determinants of Health   Financial Resource Strain: [name] Risk  (03/31/2022)   Overall Financial Resource Strain (CARDIA)    Difficulty of Paying Living Expenses: Not hard at all  Food Insecurity: No Food Insecurity (03/31/2022)   Hunger Vital Sign    Worried About Running Out of Food in the Last Year: Never true    Ran Out of Food in the Last Year: Never true  Transportation Needs: No Transportation Needs (03/31/2022)   PRAPARE - Administrator, Civil Service (Medical): No    Lack of Transportation (Non-Medical): No  Physical Activity: Insufficiently Active (03/31/2022)   Exercise Vital Sign    Days of Exercise per Week: 3 days    Minutes of Exercise per Session: 30 min  Stress: No Stress Concern Present (03/31/2022)   Madison [name] of Occupational Health - Occupational Stress Questionnaire    Feeling of Stress : Not at all  Social Connections: Moderately Isolated (03/31/2022)   Social Connection and Isolation Panel [NHANES]    Frequency of Communication with Friends and Family: More than three times a week    Frequency of Social Gatherings with Friends and Family: Once a week    Attends Religious Services: Never    Database administrator or Organizations: No    Attends Banker Meetings: Never    Marital Status: Married  Catering manager Violence: Not At Risk (03/31/2022)   Humiliation, Afraid, Rape, and Kick questionnaire    Fear of Current or Ex-Partner: No    Emotionally Abused: No    Physically Abused: No    Sexually Abused: No   Family History  Problem  Relation Age of Onset   Coronary artery disease Father    Heart disease Father    Cirrhosis Mother    Alcohol abuse Mother    Cancer Other        family history    Hypertension Maternal Grandmother     Objective: Office vital signs reviewed. BP (!) 157/81   Pulse 71   Temp 98.4 F (36.9 C)   Ht 5\' 5"  (1.651 m)   Wt 166 lb (75.3 kg)  SpO2 93%   BMI 27.62 kg/m   Physical Examination:  General: Awake, alert, well nourished, No acute distress HEENT: sclera white, MMM Cardio: regular rate and rhythm, S1S2 heard, soft systolic murmurs appreciated Pulm: clear to auscultation bilaterally, no wheezes, rhonchi or rales; normal work of breathing on room air Psych: Mood stable, speech normal, affect appropriate.  Appears happy     03/11/2023    2:47 PM 12/28/2022    1:57 PM 12/09/2022   11:49 AM  Depression screen PHQ 2/9  Decreased Interest 0 0 0  Down, Depressed, Hopeless 1 1 0  PHQ - 2 Score 1 1 0  Altered sleeping 1 1 1   Tired, decreased energy 1 1 1   Change in appetite 1 1 1   Feeling bad or failure about yourself  0 0 0  Trouble concentrating 0 0 0  Moving slowly or fidgety/restless 0 0 0  Suicidal thoughts 0 0 0  PHQ-9 Score 4 4 3   Difficult doing work/chores Not difficult at all Somewhat difficult Not difficult at all      03/11/2023    2:48 PM 12/28/2022    1:52 PM 12/09/2022   11:49 AM 12/03/2022    4:10 PM  GAD 7 : Generalized Anxiety Score  Nervous, Anxious, on Edge 2 3 1  0  Control/stop worrying 2 1 0 0  Worry too much - different things 1 1 0 0  Trouble relaxing 1 1 0 0  Restless 0 0 0 0  Easily annoyed or irritable 0 0 0 0  Afraid - awful might happen 0 0 0 0  Total GAD 7 Score 6 6 1  0  Anxiety Difficulty Not difficult at all Somewhat difficult Not difficult at all Not difficult at all   Assessment/ Plan: 77 y.o. female   GAD (generalized anxiety disorder) - Plan: desvenlafaxine (PRISTIQ) 50 MG 24 hr tablet  Stress due to illness of family  member  Reactive depression - Plan: desvenlafaxine (PRISTIQ) 50 MG 24 hr tablet  She has had a fantastic response to the Pristiq.  Will reach out to clinical pharmacy to see if this type of weight gain is expected or if it will plateau.  Hopefully we can keep her on this medication as it has been efficacious   Madison Ip, DO [facility] [phone]

## 2023-03-24 DIAGNOSIS — H35373 Puckering of macula, bilateral: Secondary | ICD-10-CM | POA: Diagnosis not present

## 2023-03-24 DIAGNOSIS — Z961 Presence of intraocular lens: Secondary | ICD-10-CM | POA: Diagnosis not present

## 2023-03-24 DIAGNOSIS — H53042 Amblyopia suspect, left eye: Secondary | ICD-10-CM | POA: Diagnosis not present

## 2023-03-24 DIAGNOSIS — H43811 Vitreous degeneration, right eye: Secondary | ICD-10-CM | POA: Diagnosis not present

## 2023-04-04 ENCOUNTER — Other Ambulatory Visit: Payer: Self-pay | Admitting: Family Medicine

## 2023-04-04 DIAGNOSIS — E559 Vitamin D deficiency, unspecified: Secondary | ICD-10-CM

## 2023-04-07 ENCOUNTER — Ambulatory Visit: Payer: PPO

## 2023-04-07 VITALS — Ht 65.0 in | Wt 166.0 lb

## 2023-04-07 DIAGNOSIS — Z0000 Encounter for general adult medical examination without abnormal findings: Secondary | ICD-10-CM

## 2023-04-07 NOTE — Patient Instructions (Signed)
Ms. Macdermott , Thank you for taking time to come for your Medicare Wellness Visit. I appreciate your ongoing commitment to your health goals. Please review the following plan we discussed and let me know if I can assist you in the future.   Referrals/Orders/Follow-Ups/Clinician Recommendations: Aim for 30 minutes of exercise or brisk walking, 6-8 glasses of water, and 5 servings of fruits and vegetables each day.  This is a list of the screening recommended for you and due dates:  Health Maintenance  Topic Date Due   Zoster (Shingles) Vaccine (1 of 2) 09/30/1964   Screening for Lung Cancer  08/07/2013   COVID-19 Vaccine (7 - 2024-25 season) 03/31/2023   DEXA scan (bone density measurement)  07/21/2023   Medicare Annual Wellness Visit  04/06/2024   DTaP/Tdap/Td vaccine (3 - Td or Tdap) 06/28/2030   Pneumonia Vaccine  Completed   Flu Shot  Completed   Hepatitis C Screening  Completed   HPV Vaccine  Aged Out   Colon Cancer Screening  Discontinued    Advanced directives: (ACP Link)Information on Advanced Care Planning can be found at [facility] Advance Health Care Directives Advance Health Care Directives ([email or web address]   Next Medicare Annual Wellness Visit scheduled for next year: Yes

## 2023-04-07 NOTE — Progress Notes (Signed)
Subjective:   Madison [name] is a 77 y.o. female who presents for Medicare Annual (Subsequent) preventive examination.  Visit Complete: Virtual I connected with  Essica H Demello on 04/07/23 by a audio enabled telemedicine application and verified that I am speaking with the correct person using two identifiers.  Patient Location: Home  Provider Location: Office/Clinic  I discussed the limitations of evaluation and management by telemedicine. The patient expressed understanding and agreed to proceed.  Vital Signs: Because this visit was a virtual/telehealth visit, some criteria may be missing or patient reported. Any vitals not documented were not able to be obtained and vitals that have been documented are patient reported.  Cardiac Risk Factors include: advanced age (>66men, >7 women);dyslipidemia;hypertension;smoking/ tobacco exposure     Objective:    Today's Vitals   04/07/23 0944  Weight: 166 lb (75.3 kg)  Height: 5\' 5"  (1.651 m)   Body mass index is 27.62 kg/m.     04/07/2023    9:48 AM 03/31/2022    1:24 PM 02/24/2021    2:20 PM 01/08/2021   10:10 AM 12/26/2020    1:09 PM 03/20/2020    6:45 PM 03/18/2020    1:21 PM  Advanced Directives  Does Patient Have a Medical Advance Directive? No Yes Yes  Yes Yes Yes  Type of Science writer of [place];Living will  Healthcare Power of [employer] Power of [employer] Power of Attorney  Does patient want to make changes to medical advance directive?  No - Patient declined    No - Patient declined No - Guardian declined  Copy of Healthcare Power of Attorney in Chart?  No - copy requested No - copy requested No - copy requested No - copy requested No - copy requested No - copy requested  Would patient like information on creating a medical advance directive? Yes (MAU/Ambulatory/Procedural Areas - Information given)          Current Medications (verified) Outpatient  Encounter Medications as of 04/07/2023  Medication Sig   albuterol (VENTOLIN HFA) 108 (90 Base) MCG/ACT inhaler Inhale 2 puffs into the lungs every 6 (six) hours as needed for wheezing or shortness of breath.   amLODipine (NORVASC) 5 MG tablet Take 1 tablet (5 mg total) by mouth daily.   aspirin EC 81 MG tablet Take 1 tablet (81 mg total) by mouth daily. Swallow whole.   conjugated estrogens (PREMARIN) vaginal cream Place 1 Applicatorful vaginally 3 (three) times a week. (Patient taking differently: Place 1 Applicatorful vaginally 3 (three) times a week. .625 mg)   desvenlafaxine (PRISTIQ) 50 MG 24 hr tablet Take 1 tablet (50 mg total) by mouth daily.   fenofibrate 160 MG tablet Take 1 tablet (160 mg total) by mouth daily.   levothyroxine (SYNTHROID) 125 MCG tablet Take 125 mcg by mouth See admin instructions.   LORazepam (ATIVAN) 1 MG tablet Take 0.5-1 tablets (0.5-1 mg total) by mouth daily as needed for anxiety.   losartan (COZAAR) 100 MG tablet Take 1 tablet (100 mg total) by mouth daily.   Melatonin 5 MG TABS Take 5 mg by mouth at bedtime as needed (sleep).   metoprolol tartrate (LOPRESSOR) 50 MG tablet TAKE ONE (1) TABLET EACH DAY   nitroGLYCERIN (NITROSTAT) 0.4 MG SL tablet Place 1 tablet (0.4 mg total) under the tongue every 5 (five) minutes as needed for chest pain.   rosuvastatin (CRESTOR) 40 MG tablet Take 1 tablet (40 mg total) by mouth  daily. 1 tablet daily   Vitamin D, Ergocalciferol, (DRISDOL) 1.25 MG (50000 UNIT) CAPS capsule TAKE 1 CAPSULE EVERY 7 DAYS   No facility-administered encounter medications on file as of 04/07/2023.    Allergies (verified) Banana, Bupropion hcl, Chantix [varenicline], Latex, and Zetia [ezetimibe]   History: Past Medical History:  Diagnosis Date   Anxiety    CAD (coronary artery disease)    PCI of an occluded right coronary artery 2001. 70% LAD stenosis, 30% circumflex stenosis.   Diverticulosis    Hyperlipidemia    Hypertension    IBS  (irritable bowel syndrome)    Leukocytosis 01/04/2013   This is followed by the hematologist     Leukocytosis, unspecified 07/24/2012   Myocardial infarction [facility]) 2001   Need for prophylactic hormone replacement therapy (postmenopausal)    Osteopenia    Osteoporosis    PONV (postoperative nausea and vomiting)    Thyroid cancer (HCC) 2004   Dr. Demaris Callander    Thyroid nodule 2004   Tobacco abuse    Past Surgical History:  Procedure Laterality Date   APPENDECTOMY  1955   BACK SURGERY  06/2000   CARDIAC CATHETERIZATION  2001   CHOLECYSTECTOMY  07/2002   EYE SURGERY Bilateral 2019   cataract   LIPOMA EXCISION Right 08/2003   r arm    LUMBAR LAMINECTOMY/DECOMPRESSION MICRODISCECTOMY Left 03/20/2020   Procedure: LEFT LUMBAR FOUR-FIVE MICRODISCECTOMY;  Surgeon: Tressie Stalker, MD;  Location: [facility] OR;  Service: Neurosurgery;  Laterality: Left;   peserie     THYROIDECTOMY Bilateral 02/2003   Family History  Problem Relation Age of Onset   Coronary artery disease Father    Heart disease Father    Cirrhosis Mother    Alcohol abuse Mother    Cancer Other        family history    Hypertension Maternal Grandmother    Social History   Socioeconomic History   Marital status: Married    Spouse name: Not on file   Number of children: 2   Years of education: Not on file   Highest education level: Not on file  Occupational History   Occupation: Retired     Comment: Optometrist  Tobacco Use   Smoking status: Every Day    Current packs/day: 1.00    Average packs/day: 1 pack/day for 40.0 years (40.0 ttl pk-yrs)    Types: Cigarettes   Smokeless tobacco: Never  Vaping Use   Vaping status: Never Used  Substance and Sexual Activity   Alcohol use: No   Drug use: No   Sexual activity: Not Currently  Other Topics Concern   Not on file  Social History Narrative   Dr.  Alyse Low -(surgeon) [phone]   Dr. Jeraldine Loots- Endocrinologist  [facility]    Social Drivers of Health   Financial  Resource Strain: Low Risk  (04/07/2023)   Overall Financial Resource Strain (CARDIA)    Difficulty of Paying Living Expenses: Not hard at all  Food Insecurity: No Food Insecurity (04/07/2023)   Hunger Vital Sign    Worried About Running Out of Food in the Last Year: Never true    Ran Out of Food in the Last Year: Never true  Transportation Needs: No Transportation Needs (04/07/2023)   PRAPARE - Administrator, Civil Service (Medical): No    Lack of Transportation (Non-Medical): No  Physical Activity: Insufficiently Active (04/07/2023)   Exercise Vital Sign    Days of Exercise per Week: 3 days    Minutes  of Exercise per Session: 30 min  Stress: No Stress Concern Present (04/07/2023)   [employer] of Occupational Health - Occupational Stress Questionnaire    Feeling of Stress : Not at all  Social Connections: Moderately Isolated (04/07/2023)   Social Connection and Isolation Panel [NHANES]    Frequency of Communication with Friends and Family: More than three times a week    Frequency of Social Gatherings with Friends and Family: Once a week    Attends Religious Services: Never    Database administrator or Organizations: No    Attends Engineer, structural: Never    Marital Status: Married    Tobacco Counseling Ready to quit: Not Answered Counseling given: Not Answered   Clinical Intake:  Pre-visit preparation completed: Yes  Pain : No/denies pain     Diabetes: No  How often do you need to have someone help you when you read instructions, pamphlets, or other written materials from your doctor or pharmacy?: 1 - Never  Interpreter Needed?: No  Information entered by :: Kandis Fantasia LPN   Activities of Daily Living    04/07/2023    9:47 AM  In your present state of health, do you have any difficulty performing the following activities:  Hearing? 0  Vision? 0  Difficulty concentrating or making decisions? 0  Walking or climbing stairs? 0   Dressing or bathing? 0  Doing errands, shopping? 0  Preparing Food and eating ? N  Using the Toilet? N  In the past six months, have you accidently leaked urine? N  Do you have problems with loss of bowel control? N  Managing your Medications? N  Managing your Finances? N  Housekeeping or managing your Housekeeping? N    Patient Care Team: Raliegh Ip, DO as PCP - General (Family Medicine) Rollene Rotunda, MD as PCP - Cardiology (Cardiology) Frances Nickels, MD (Internal Medicine) Glenford Peers, MD (Hematology and Oncology) Rollene Rotunda, MD (Cardiology) Hart Carwin, MD (Inactive) as Consulting Physician (Gastroenterology) Karen Kitchens, MD as Referring Physician (Endocrinology) Tressie Stalker, MD as Consulting Physician (Neurosurgery) Paulina Fusi, NP (Urology) Chucky May, M.D., PA  Indicate any recent Medical Services you may have received from other than [facility] providers in the past year (date may be approximate).     Assessment:   This is a routine wellness examination for Madison [name].  Hearing/Vision screen Hearing Screening - Comments:: Denies hearing difficulties   Vision Screening - Comments:: Wears rx glasses - up to date with routine eye exams with [facility]     Goals Addressed             This Visit's Progress    COMPLETED: Patient Stated       03/31/2022 AWV Goal: Fall Prevention  Over the next year, patient will decrease their risk for falls by: Using assistive devices, such as a cane or walker, as needed Identifying fall risks within their home and correcting them by: Removing throw rugs Adding handrails to stairs or ramps Removing clutter and keeping a clear pathway throughout the home Increasing light, especially at night Adding shower handles/bars Raising toilet seat Identifying potential personal risk factors for falls: Medication side effects Incontinence/urgency Vestibular dysfunction Hearing  loss Musculoskeletal disorders Neurological disorders Orthostatic hypotension       Remain active and independent        Depression Screen    04/07/2023    9:46 AM 03/11/2023    2:47 PM 12/28/2022  1:57 PM 12/09/2022   11:49 AM 12/03/2022    4:09 PM 07/02/2022    9:24 AM 03/31/2022    1:23 PM  PHQ 2/9 Scores  PHQ - 2 Score 1 1 1  0 0 0 0  PHQ- 9 Score 4 4 4 3  0 0     Fall Risk    04/07/2023    9:47 AM 12/28/2022    1:52 PM 12/09/2022   11:49 AM 07/02/2022    9:24 AM 03/31/2022    1:27 PM  Fall Risk   Falls in the past year? 0 0 0 0 0  Number falls in past yr: 0 0  0   Injury with Fall? 0 0  0   Risk for fall due to : No Fall Risks No Fall Risks  No Fall Risks   Follow up Falls prevention discussed;Education provided;Falls evaluation completed Education provided  Education provided Falls evaluation completed    MEDICARE RISK AT HOME: Medicare Risk at Home Any stairs in or around the home?: No If so, are there any without handrails?: No Home free of loose throw rugs in walkways, pet beds, electrical cords, etc?: Yes Adequate lighting in your home to reduce risk of falls?: Yes Life alert?: No Use of a cane, walker or w/c?: No Grab bars in the bathroom?: Yes Shower chair or bench in shower?: No Elevated toilet seat or a handicapped toilet?: Yes  TIMED UP AND GO:  Was the test performed?  No    Cognitive Function:    08/22/2014    2:34 PM  MMSE - Mini Mental State Exam  Orientation to time 5  Orientation to Place 5  Registration 3  Attention/ Calculation 5  Recall 1  Language- name 2 objects 2  Language- repeat 1  Language- follow 3 step command 3  Language- read & follow direction 1  Write a sentence 1  Copy design 1  Total score 28        04/07/2023    9:48 AM 03/31/2022    1:26 PM 02/24/2021    2:08 PM  6CIT Screen  What Year? 0 points 0 points 0 points  What month? 0 points 0 points 0 points  What time? 0 points 0 points 0 points  Count back  from 20 0 points 0 points 0 points  Months in reverse 0 points 0 points 0 points  Repeat phrase 0 points 0 points 0 points  Total Score 0 points 0 points 0 points    Immunizations Immunization History  Administered Date(s) Administered   Fluad Quad(high Dose 65+) 01/29/2019, 01/21/2020, 02/01/2022   Influenza, High Dose Seasonal PF 03/19/2015, 02/02/2016, 01/25/2017, 02/02/2018   Influenza,inj,Quad PF,6+ Mos 01/25/2013, 01/31/2014   Influenza-Unspecified 01/26/2021, 02/03/2023   PFIZER(Purple Top)SARS-COV-2 Vaccination 05/08/2019, 05/27/2019, 02/08/2020, 07/28/2020, 01/28/2022   Pfizer Covid-19 Vaccine Bivalent Booster 81yrs & up 02/03/2023   Pneumococcal Conjugate-13 05/15/2013   Pneumococcal Polysaccharide-23 02/02/2012   Respiratory Syncytial Virus Vaccine,Recomb Aduvanted(Arexvy) 01/11/2022   Td 06/27/2020   Tdap 05/26/2007   Zoster, Live 11/26/2009    TDAP status: Up to date  Flu Vaccine status: Up to date  Pneumococcal vaccine status: Up to date  Covid-19 vaccine status: Information provided on how to obtain vaccines.   Qualifies for Shingles Vaccine? Yes   Zostavax completed Yes   Shingrix Completed?: No.    Education has been provided regarding the importance of this vaccine. Patient has been advised to call insurance company to determine out of pocket expense if  they have not yet received this vaccine. Advised may also receive vaccine at local pharmacy or Health Dept. Verbalized acceptance and understanding.  Screening Tests Health Maintenance  Topic Date Due   Zoster Vaccines- Shingrix (1 of 2) 09/30/1965   Lung Cancer Screening  08/07/2013   COVID-19 Vaccine (7 - 2024-25 season) 03/31/2023   DEXA SCAN  07/21/2023   Medicare Annual Wellness (AWV)  04/06/2024   DTaP/Tdap/Td (3 - Td or Tdap) 06/28/2030   Pneumonia Vaccine 74+ Years old  Completed   INFLUENZA VACCINE  Completed   Hepatitis C Screening  Completed   HPV VACCINES  Aged Out   Colonoscopy   Discontinued    Health Maintenance  Health Maintenance Due  Topic Date Due   Zoster Vaccines- Shingrix (1 of 2) 09/30/1965   Lung Cancer Screening  08/07/2013   COVID-19 Vaccine (7 - 2024-25 season) 03/31/2023    Colorectal cancer screening: No longer required.   Mammogram status: Completed 06/24/22. Repeat every year  Bone Density status: Completed 07/20/21. Results reflect: Bone density results: OSTEOPOROSIS. Repeat every 2 years.  Lung Cancer Screening: (Low Dose CT Chest recommended if Age 57-80 years, 20 pack-year currently smoking OR have quit w/in 15years.) does qualify.   Lung Cancer Screening Referral: ordered 07/02/22  Additional Screening:  Hepatitis C Screening: does qualify; Completed 07/02/19  Vision Screening: Recommended annual ophthalmology exams for early detection of glaucoma and other disorders of the eye. Is the patient up to date with their annual eye exam?  Yes  Who is the provider or what is the name of the office in which the patient attends annual eye exams? [facility]  If pt is not established with a provider, would they like to be referred to a provider to establish care? No .   Dental Screening: Recommended annual dental exams for proper oral hygiene  Community Resource Referral / Chronic Care Management: CRR required this visit?  No   CCM required this visit?  No     Plan:     I have personally reviewed and noted the following in the patient's chart:   Medical and social history Use of alcohol, tobacco or illicit drugs  Current medications and supplements including opioid prescriptions. Patient is not currently taking opioid prescriptions. Functional ability and status Nutritional status Physical activity Advanced directives List of other physicians Hospitalizations, surgeries, and ER visits in previous 12 months Vitals Screenings to include cognitive, depression, and falls Referrals and appointments  In addition, I have  reviewed and discussed with patient certain preventive protocols, quality metrics, and best practice recommendations. A written personalized care plan for preventive services as well as general preventive health recommendations were provided to patient.     Kandis Fantasia [place], [place]   [id number]/01/9603   After Visit Summary: (MyChart) Due to this being a telephonic visit, the after visit summary with patients personalized plan was offered to patient via MyChart   Nurse Notes: No concerns at this time

## 2023-05-12 DIAGNOSIS — C73 Malignant neoplasm of thyroid gland: Secondary | ICD-10-CM | POA: Diagnosis not present

## 2023-05-12 DIAGNOSIS — E890 Postprocedural hypothyroidism: Secondary | ICD-10-CM | POA: Diagnosis not present

## 2023-05-13 ENCOUNTER — Other Ambulatory Visit: Payer: Self-pay | Admitting: Family Medicine

## 2023-05-13 DIAGNOSIS — Z1231 Encounter for screening mammogram for malignant neoplasm of breast: Secondary | ICD-10-CM

## 2023-05-19 DIAGNOSIS — C73 Malignant neoplasm of thyroid gland: Secondary | ICD-10-CM | POA: Diagnosis not present

## 2023-05-19 DIAGNOSIS — E890 Postprocedural hypothyroidism: Secondary | ICD-10-CM | POA: Diagnosis not present

## 2023-05-19 DIAGNOSIS — Z1330 Encounter for screening examination for mental health and behavioral disorders, unspecified: Secondary | ICD-10-CM | POA: Diagnosis not present

## 2023-06-26 NOTE — Progress Notes (Unsigned)
  Cardiology Office Note:   Date:  06/26/2023  ID:  Madison [name], DOB Dec 31, 1945, MRN [id number] PCP: Raliegh Ip, DO  [facility] Providers Cardiologist:  Rollene Rotunda, MD {  History of Present Illness:   Madison [name] is a 78 y.o. female who presents  for followup of CAD.  She had a negative Lexiscan Myoview and normal echo in 2018.  Since I last saw her ***   ***  she has been keeping a blood pressure diary once a day most days for the last couple of weeks.  Her blood pressure seems to be well-controlled.  It was elevated in the office and I tried to increase her amlodipine but she had increased leg swelling we had to cut back down.  She has not had any new cardiovascular symptoms. The patient denies any new symptoms such as chest discomfort, neck or arm discomfort. There has been no new shortness of breath, PND or orthopnea. There have been no reported palpitations, presyncope or syncope.   ROS: ***  Studies Reviewed:    EKG:       ***  Risk Assessment/Calculations:   {Does this patient have ATRIAL FIBRILLATION?[phone]} No BP recorded.  {Refresh Note OR Click here to enter BP  :1}***        Physical Exam:   VS:  There were no vitals taken for this visit.   Wt Readings from Last 3 Encounters:  04/07/23 166 lb (75.3 kg)  03/11/23 166 lb (75.3 kg)  12/28/22 155 lb 9.6 oz (70.6 kg)     GEN: Well nourished, well developed in no acute distress NECK: No JVD; No carotid bruits CARDIAC: ***RR, *** murmurs, rubs, gallops RESPIRATORY:  Clear to auscultation without rales, wheezing or rhonchi  ABDOMEN: Soft, non-tender, non-distended EXTREMITIES:  No edema; No deformity   ASSESSMENT AND PLAN:   CORONARY ARTERY DISEASE :   The patient has *** no new sypmtoms.  No further cardiovascular testing is indicated.  We will continue with aggressive risk reduction and meds as listed.   RBBB:      This is *** chronic.  No change in therapy.   TOBACCO ABUSE:   ***   She is unable to quit smoking.  DYSLIPIDEMIA:   LDL was ***  64.  No change in therapy.    HTN :   The blood pressure is *** elevated today but her diary says it is typically in the 120s over 70s at home.  I am going to have her keep her blood pressure diary for 3 times a day for couple of weeks and review these.   If her blood pressure is not at target uniformly and will change her losartan to losartan HCT.      Follow up ***  Signed, Rollene Rotunda, MD

## 2023-06-27 ENCOUNTER — Ambulatory Visit
Admission: RE | Admit: 2023-06-27 | Discharge: 2023-06-27 | Disposition: A | Discharge status: Home / Self Care | Payer: PPO | Source: Ambulatory Visit | Attending: Family Medicine | Admitting: Family Medicine

## 2023-06-27 DIAGNOSIS — Z1231 Encounter for screening mammogram for malignant neoplasm of breast: Secondary | ICD-10-CM

## 2023-06-29 ENCOUNTER — Encounter: Payer: Self-pay | Admitting: Cardiology

## 2023-06-29 ENCOUNTER — Ambulatory Visit: Payer: PPO | Admitting: Cardiology

## 2023-06-29 VITALS — BP 130/84 | HR 62 | Ht 65.5 in | Wt 168.0 lb

## 2023-06-29 DIAGNOSIS — I2510 Atherosclerotic heart disease of native coronary artery without angina pectoris: Secondary | ICD-10-CM

## 2023-06-29 DIAGNOSIS — E785 Hyperlipidemia, unspecified: Secondary | ICD-10-CM | POA: Diagnosis not present

## 2023-06-29 DIAGNOSIS — I4510 Unspecified right bundle-branch block: Secondary | ICD-10-CM | POA: Diagnosis not present

## 2023-06-29 DIAGNOSIS — I10 Essential (primary) hypertension: Secondary | ICD-10-CM

## 2023-06-29 NOTE — Patient Instructions (Signed)
 Medication Instructions:  The current medical regimen is effective;  continue present plan and medications.  *If you need a refill on your cardiac medications before your next appointment, please call your pharmacy*  Follow-Up: At [facility], you and your health needs are our priority.  As part of our continuing mission to provide you with exceptional heart care, we have created designated Provider Care Teams.  These Care Teams include your primary Cardiologist (physician) and Advanced Practice Providers (APPs -  Physician Assistants and Nurse Practitioners) who all work together to provide you with the care you need, when you need it.  We recommend signing up for the patient portal called "MyChart".  Sign up information is provided on this After Visit Summary.  MyChart is used to connect with patients for Virtual Visits (Telemedicine).  Patients are able to view lab/test results, encounter notes, upcoming appointments, etc.  Non-urgent messages can be sent to your provider as well.   To learn more about what you can do with MyChart, go to [email or web address].    Your next appointment:   18 month(s)  Provider:   Rollene Rotunda, MD

## 2023-07-18 ENCOUNTER — Other Ambulatory Visit: Payer: Self-pay | Admitting: Family Medicine

## 2023-07-18 DIAGNOSIS — E559 Vitamin D deficiency, unspecified: Secondary | ICD-10-CM

## 2023-09-13 ENCOUNTER — Ambulatory Visit (INDEPENDENT_AMBULATORY_CARE_PROVIDER_SITE_OTHER): Payer: PPO | Admitting: Family Medicine

## 2023-09-13 ENCOUNTER — Encounter: Payer: Self-pay | Admitting: Family Medicine

## 2023-09-13 VITALS — BP 153/85 | HR 71 | Temp 98.8°F | Ht 65.0 in | Wt 172.0 lb

## 2023-09-13 DIAGNOSIS — F17200 Nicotine dependence, unspecified, uncomplicated: Secondary | ICD-10-CM

## 2023-09-13 DIAGNOSIS — I10 Essential (primary) hypertension: Secondary | ICD-10-CM | POA: Diagnosis not present

## 2023-09-13 DIAGNOSIS — L989 Disorder of the skin and subcutaneous tissue, unspecified: Secondary | ICD-10-CM

## 2023-09-13 DIAGNOSIS — Z0000 Encounter for general adult medical examination without abnormal findings: Secondary | ICD-10-CM

## 2023-09-13 DIAGNOSIS — E890 Postprocedural hypothyroidism: Secondary | ICD-10-CM

## 2023-09-13 DIAGNOSIS — E7800 Pure hypercholesterolemia, unspecified: Secondary | ICD-10-CM

## 2023-09-13 DIAGNOSIS — R7303 Prediabetes: Secondary | ICD-10-CM | POA: Diagnosis not present

## 2023-09-13 DIAGNOSIS — E559 Vitamin D deficiency, unspecified: Secondary | ICD-10-CM | POA: Diagnosis not present

## 2023-09-13 DIAGNOSIS — Z0001 Encounter for general adult medical examination with abnormal findings: Secondary | ICD-10-CM

## 2023-09-13 DIAGNOSIS — F329 Major depressive disorder, single episode, unspecified: Secondary | ICD-10-CM | POA: Diagnosis not present

## 2023-09-13 DIAGNOSIS — M810 Age-related osteoporosis without current pathological fracture: Secondary | ICD-10-CM | POA: Diagnosis not present

## 2023-09-13 DIAGNOSIS — J9801 Acute bronchospasm: Secondary | ICD-10-CM | POA: Diagnosis not present

## 2023-09-13 DIAGNOSIS — F411 Generalized anxiety disorder: Secondary | ICD-10-CM | POA: Diagnosis not present

## 2023-09-13 DIAGNOSIS — I2510 Atherosclerotic heart disease of native coronary artery without angina pectoris: Secondary | ICD-10-CM | POA: Diagnosis not present

## 2023-09-13 LAB — BAYER DCA HB A1C WAIVED: HB A1C (BAYER DCA - WAIVED): 6.3 % — ABNORMAL HIGH (ref 4.8–5.6)

## 2023-09-13 LAB — LIPID PANEL

## 2023-09-13 MED ORDER — VITAMIN D (ERGOCALCIFEROL) 1.25 MG (50000 UNIT) PO CAPS: 50000.0000 [IU] | ORAL_CAPSULE | ORAL | 4 refills | Status: AC

## 2023-09-13 MED ORDER — METOPROLOL TARTRATE 50 MG PO TABS: ORAL_TABLET | ORAL | 3 refills | Status: AC

## 2023-09-13 MED ORDER — FENOFIBRATE 160 MG PO TABS: 160.0000 mg | ORAL_TABLET | Freq: Every day | ORAL | 3 refills | Status: AC

## 2023-09-13 MED ORDER — DESVENLAFAXINE SUCCINATE ER 50 MG PO TB24: 50.0000 mg | ORAL_TABLET | Freq: Every day | ORAL | 3 refills | Status: AC

## 2023-09-13 MED ORDER — TRELEGY ELLIPTA 200-62.5-25 MCG/ACT IN AEPB
1.0000 | INHALATION_SPRAY | Freq: Every day | RESPIRATORY_TRACT | Status: DC
Start: 2023-09-13 — End: 2024-03-09

## 2023-09-13 MED ORDER — ROSUVASTATIN CALCIUM 40 MG PO TABS: 40.0000 mg | ORAL_TABLET | Freq: Every day | ORAL | 3 refills | Status: AC

## 2023-09-13 MED ORDER — LORAZEPAM 1 MG PO TABS: 0.5000 mg | ORAL_TABLET | Freq: Every day | ORAL | 1 refills | Status: DC | PRN

## 2023-09-13 MED ORDER — AMLODIPINE BESYLATE 5 MG PO TABS: 5.0000 mg | ORAL_TABLET | Freq: Every day | ORAL | 3 refills | Status: AC

## 2023-09-13 MED ORDER — LOSARTAN POTASSIUM 100 MG PO TABS: 100.0000 mg | ORAL_TABLET | Freq: Every day | ORAL | 3 refills | Status: AC

## 2023-09-13 NOTE — Progress Notes (Signed)
 Madison [name] is a 78 y.o. female presents to office today for annual physical exam examination.    Concerns today include: 1.  Skin lesions Reports several skin lesions throughout her body.  She has some on the ankles which she was previously told were "false warts".  She used to be under the care of [facility] dermatology but has not seen them for several years.  Would be interested in full body examination  2.  Shortness of breath She reports shortness of breath with exertion and this has been a chronic issue for her, particularly if she runs around a lot.  She has been having some allergy symptoms but notes that allergy medications raise her blood pressure so she takes nothing.  She does have an albuterol  inhaler at home but does not utilize it.  She continues to smoke about 1/2 pack/day.  No hemoptysis.  No unplanned weight loss.  No night sweats.  Occupation: Retired  There are no preventive care reminders to display for this patient.  Refills needed today: All  Immunization History  Administered Date(s) Administered   Fluad Quad(high Dose 65+) 01/29/2019, 01/21/2020, 02/01/2022   Influenza, High Dose Seasonal PF 03/19/2015, 02/02/2016, 01/25/2017, 02/02/2018   Influenza,inj,Quad PF,6+ Mos 01/25/2013, 01/31/2014   Influenza-Unspecified 01/26/2021, 02/03/2023   PFIZER(Purple Top)SARS-COV-2 Vaccination 05/08/2019, 05/27/2019, 02/08/2020, 07/28/2020, 01/28/2022   Pfizer Covid-19 Vaccine Bivalent Booster 31yrs & up 02/03/2023   Pneumococcal Conjugate-13 05/15/2013   Pneumococcal Polysaccharide-23 02/02/2012   Respiratory Syncytial Virus Vaccine,Recomb Aduvanted(Arexvy) 01/11/2022   Td 06/27/2020   Tdap 05/26/2007   Zoster, Live 11/26/2009   Past Medical History:  Diagnosis Date   Anxiety    CAD (coronary artery disease)    PCI of an occluded right coronary artery 2001. 70% LAD stenosis, 30% circumflex stenosis.   Diverticulosis    Hyperlipidemia    Hypertension    IBS  (irritable bowel syndrome)    Leukocytosis 01/04/2013   This is followed by the hematologist     Leukocytosis, unspecified 07/24/2012   Myocardial infarction [facility]) 2001   Need for prophylactic hormone replacement therapy (postmenopausal)    Osteopenia    Osteoporosis    PONV (postoperative nausea and vomiting)    Thyroid  cancer (HCC) 2004   Dr. Candace Cerise    Thyroid  nodule 2004   Tobacco abuse    Social History   Socioeconomic History   Marital status: Married    Spouse name: Not on file   Number of children: 2   Years of education: Not on file   Highest education level: Associate degree: occupational, Scientist, product/process development, or vocational program  Occupational History   Occupation: Retired     Comment: Optometrist  Tobacco Use   Smoking status: Every Day    Current packs/day: 1.00    Average packs/day: 1 pack/day for 40.0 years (40.0 ttl pk-yrs)    Types: Cigarettes   Smokeless tobacco: Never  Vaping Use   Vaping status: Never Used  Substance and Sexual Activity   Alcohol use: No   Drug use: No   Sexual activity: Not Currently  Other Topics Concern   Not on file  Social History Narrative   Dr.  Elzie Handler -(surgeon) [phone]   Dr. Clem Currier- Endocrinologist  [facility]    Social Drivers of Health   Financial Resource Strain: Low Risk  (09/06/2023)   Overall Financial Resource Strain (CARDIA)    Difficulty of Paying Living Expenses: Not hard at all  Food Insecurity: No Food Insecurity (09/06/2023)   Hunger  Vital Sign    Worried About Programme researcher, broadcasting/film/video in the Last Year: Never true    Ran Out of Food in the Last Year: Never true  Transportation Needs: No Transportation Needs (09/06/2023)   PRAPARE - Administrator, Civil Service (Medical): No    Lack of Transportation (Non-Medical): No  Physical Activity: Insufficiently Active (09/06/2023)   Exercise Vital Sign    Days of Exercise per Week: 2 days    Minutes of Exercise per Session: 20 min  Stress: No Stress Concern  Present (09/06/2023)   [employer] of Occupational Health - Occupational Stress Questionnaire    Feeling of Stress : Only a little  Social Connections: Moderately Integrated (09/06/2023)   Social Connection and Isolation Panel [NHANES]    Frequency of Communication with Friends and Family: More than three times a week    Frequency of Social Gatherings with Friends and Family: Patient declined    Attends Religious Services: Never    Database administrator or Organizations: Yes    Attends Engineer, structural: More than 4 times per year    Marital Status: Married  Catering manager Violence: Not At Risk (05/18/2023)   Received from [facility]   HITS    Over the last 12 months how often did your partner physically hurt you?: Never    Over the last 12 months how often did your partner insult you or talk down to you?: Never    Over the last 12 months how often did your partner threaten you with physical harm?: Never    Over the last 12 months how often did your partner scream or curse at you?: Never   Past Surgical History:  Procedure Laterality Date   APPENDECTOMY  1955   BACK SURGERY  06/2000   CARDIAC CATHETERIZATION  2001   CHOLECYSTECTOMY  07/2002   EYE SURGERY Bilateral 2019   cataract   LIPOMA EXCISION Right 08/2003   r arm    LUMBAR LAMINECTOMY/DECOMPRESSION MICRODISCECTOMY Left 03/20/2020   Procedure: LEFT LUMBAR FOUR-FIVE MICRODISCECTOMY;  Surgeon: Garry Kansas, MD;  Location: [facility] OR;  Service: Neurosurgery;  Laterality: Left;   peserie     THYROIDECTOMY Bilateral 02/2003   Family History  Problem Relation Age of Onset   Coronary artery disease Father    Heart disease Father    Cirrhosis Mother    Alcohol abuse Mother    Cancer Other        family history    Hypertension Maternal Grandmother     Current Outpatient Medications:    amLODipine  (NORVASC ) 5 MG tablet, Take 1 tablet (5 mg total) by mouth daily., Disp: 90 tablet, Rfl: 3   aspirin  EC 81 MG  tablet, Take 1 tablet (81 mg total) by mouth daily. Swallow whole., Disp: 30 tablet, Rfl: 11   desvenlafaxine  (PRISTIQ ) 50 MG 24 hr tablet, Take 1 tablet (50 mg total) by mouth daily., Disp: 90 tablet, Rfl: 3   estradiol (ESTRACE) 0.1 MG/GM vaginal cream, Place vaginally 3 (three) times a week., Disp: , Rfl:    fenofibrate  160 MG tablet, Take 1 tablet (160 mg total) by mouth daily., Disp: 90 tablet, Rfl: 3   levothyroxine  (SYNTHROID ) 125 MCG tablet, Take 125 mcg by mouth See admin instructions., Disp: , Rfl:    LORazepam  (ATIVAN ) 1 MG tablet, Take 0.5-1 tablets (0.5-1 mg total) by mouth daily as needed for anxiety., Disp: 30 tablet, Rfl: 1   losartan  (COZAAR ) 100 MG tablet,  Take 1 tablet (100 mg total) by mouth daily., Disp: 90 tablet, Rfl: 3   Melatonin 5 MG TABS, Take 5 mg by mouth at bedtime as needed (sleep)., Disp: , Rfl:    metoprolol  tartrate (LOPRESSOR ) 50 MG tablet, TAKE ONE (1) TABLET EACH DAY (Patient taking differently: TAKE 1/2  TABLET by mouth  twice a DAY), Disp: 90 tablet, Rfl: 3   nitroGLYCERIN  (NITROSTAT ) 0.4 MG SL tablet, Place 1 tablet (0.4 mg total) under the tongue every 5 (five) minutes as needed for chest pain., Disp: 25 tablet, Rfl: 3   rosuvastatin  (CRESTOR ) 40 MG tablet, Take 1 tablet (40 mg total) by mouth daily. 1 tablet daily, Disp: 90 tablet, Rfl: 3   Vitamin D , Ergocalciferol , (DRISDOL ) 1.25 MG (50000 UNIT) CAPS capsule, TAKE 1 CAPSULE EVERY 7 DAYS, Disp: 12 capsule, Rfl: 1  Allergies  Allergen Reactions   Banana Itching and Swelling    Throat and ears itch,ears feel like they swell   Bupropion Hcl     REACTION: paranoia   Chantix [Varenicline]     paranoia   Latex Rash   Zetia  [Ezetimibe ]     Myalgia     ROS: Review of Systems A comprehensive review of systems was negative except for: Respiratory: positive for dyspnea on exertion Gastrointestinal: positive for constipation and diarrhea Integument/breast: positive for skin lesion(s)    Physical exam BP  (!) 153/85   Pulse 71   Temp 98.8 F (37.1 C)   Ht 5\' 5"  (1.651 m)   Wt 172 lb (78 kg)   SpO2 92%   BMI 28.62 kg/m  General appearance: alert, cooperative, no distress, and morbidly obese Head: Normocephalic, without obvious abnormality, atraumatic Eyes: negative findings: lids and lashes normal, conjunctivae and sclerae normal, corneas clear, and pupils equal, round, reactive to light and accomodation Ears: normal TM's and external ear canals both ears Nose: Nares normal. Septum midline. Mucosa normal. No drainage or sinus tenderness. Throat: lips, mucosa, and tongue normal; teeth and gums normal Neck: no adenopathy, supple, symmetrical, trachea midline, and thyroid  not enlarged, symmetric, no tenderness/mass/nodules Back: symmetric, no curvature. ROM normal. No CVA tenderness. Lungs: Global expiratory wheezes with good air movement.  Normal work of breathing on room air. Heart: regular rate and rhythm, S1, S2 normal, no murmur, click, rub or gallop Abdomen: Obese, soft, nontender Extremities: extremities normal, atraumatic, no cyanosis or edema and varicose veins noted on the lower legs Pulses: 2+ and symmetric Skin: Has flesh colored hyperkeratotic lesions along the medial lower ankles bilaterally.  She has multiple Lentigo and nevi. Lymph nodes: Cervical, supraclavicular, and axillary nodes normal. Neurologic: Grossly normal      09/13/2023    9:26 AM 04/07/2023    9:46 AM 03/11/2023    2:47 PM  Depression screen PHQ 2/9  Decreased Interest 0 0 0  Down, Depressed, Hopeless 1 1 1   PHQ - 2 Score 1 1 1   Altered sleeping 1 1 1   Tired, decreased energy 0 1 1  Change in appetite 0 1 1  Feeling bad or failure about yourself  0 0 0  Trouble concentrating 0 0 0  Moving slowly or fidgety/restless 0 0 0  Suicidal thoughts 0 0 0  PHQ-9 Score 2 4 4   Difficult doing work/chores   Not difficult at all      09/13/2023    9:26 AM 03/11/2023    2:48 PM 12/28/2022    1:52 PM 12/09/2022    11:49 AM  GAD 7 :  Generalized Anxiety Score  Nervous, Anxious, on Edge 0 2 3 1   Control/stop worrying 0 2 1 0  Worry too much - different things 0 1 1 0  Trouble relaxing 1 1 1  0  Restless 0 0 0 0  Easily annoyed or irritable 0 0 0 0  Afraid - awful might happen 0 0 0 0  Total GAD 7 Score 1 6 6 1   Anxiety Difficulty Somewhat difficult Not difficult at all Somewhat difficult Not difficult at all     Assessment/ Plan: Channel H Sulak here for annual physical exam.   Annual physical exam  Skin lesions - Plan: Ambulatory referral to Dermatology  TOBACCO ABUSE - Plan: CBC with Differential, Fluticasone -Umeclidin-Vilant (TRELEGY ELLIPTA) 200-62.5-25 MCG/ACT AEPB  Bronchospasm - Plan: Fluticasone -Umeclidin-Vilant (TRELEGY ELLIPTA) 200-62.5-25 MCG/ACT AEPB  GAD (generalized anxiety disorder) - Plan: TSH + free T4, desvenlafaxine  (PRISTIQ ) 50 MG 24 hr tablet, LORazepam  (ATIVAN ) 1 MG tablet  Reactive depression - Plan: desvenlafaxine  (PRISTIQ ) 50 MG 24 hr tablet  Pure hypercholesterolemia - Plan: CMP14+EGFR, Lipid Panel, fenofibrate  160 MG tablet, rosuvastatin  (CRESTOR ) 40 MG tablet  Essential hypertension - Plan: CMP14+EGFR, losartan  (COZAAR ) 100 MG tablet, metoprolol  tartrate (LOPRESSOR ) 50 MG tablet  Coronary artery disease involving native coronary artery of native heart without angina pectoris - Plan: CMP14+EGFR, Lipid Panel  Postoperative hypothyroidism - Plan: TSH + free T4  Age-related osteoporosis without current pathological fracture - Plan: CMP14+EGFR, VITAMIN D  25 Hydroxy (Vit-D Deficiency, Fractures)  Vitamin D  deficiency - Plan: Vitamin D , Ergocalciferol , (DRISDOL ) 1.25 MG (50000 UNIT) CAPS capsule  Pre-diabetes - Plan: Bayer DCA Hb A1c Waived  She declines shingles shot, lung cancer screening and bone density scan today.  I have referred her to dermatology for full body examination.  She is a Fitzpatrick 1 and has multiple lesions, some which may represent actinic  keratoses but difficult to tell on the ankles.  She did have some wheezes globally and given tobacco abuse I suspect there is an underlying COPD.  I am going to trial her on Trelegy.  Encouraged her to use albuterol  inhaler as needed.  Continued smoking cessation recommended.  Fasting labs were collected today.  All medications renewed.  Clinically euthymic.  Calcium  and vitamin D  levels collected given osteoporosis but again she declined DEXA scan  Depression is well-controlled with Pristiq .  No changes needed.  Ativan  renewed for sparing use.  Up-to-date on UDS and CSC and the national narcotic database was reviewed with no red flags.  Counseled on healthy lifestyle choices, including diet (rich in fruits, vegetables and lean meats and low in salt and simple carbohydrates) and exercise (at least 30 minutes of moderate physical activity daily).  Patient to follow up 86m for thyroid / mood/ UDS/ CSC  Anjalina Bergevin M. Bonnell Butcher, DO

## 2023-09-14 ENCOUNTER — Ambulatory Visit: Payer: Self-pay | Admitting: Family Medicine

## 2023-09-14 LAB — CMP14+EGFR
ALT: 23 IU/L (ref 0–32)
AST: 25 IU/L (ref 0–40)
Albumin: 4.5 g/dL (ref 3.8–4.8)
Alkaline Phosphatase: 75 IU/L (ref 44–121)
BUN/Creatinine Ratio: 26 (ref 12–28)
BUN: 22 mg/dL (ref 8–27)
Bilirubin Total: 0.3 mg/dL (ref 0.0–1.2)
CO2: 17 mmol/L — ABNORMAL LOW (ref 20–29)
Calcium: 9.8 mg/dL (ref 8.7–10.3)
Chloride: 104 mmol/L (ref 96–106)
Creatinine, Ser: 0.84 mg/dL (ref 0.57–1.00)
Globulin, Total: 2.8 g/dL (ref 1.5–4.5)
Glucose: 145 mg/dL — ABNORMAL HIGH (ref 70–99)
Potassium: 4.6 mmol/L (ref 3.5–5.2)
Sodium: 138 mmol/L (ref 134–144)
Total Protein: 7.3 g/dL (ref 6.0–8.5)
eGFR: 72 mL/min/{1.73_m2} (ref >59)

## 2023-09-14 LAB — CBC WITH DIFFERENTIAL/PLATELET
Basophils Absolute: 0.1 10*3/uL (ref 0.0–0.2)
Basos: 1 %
EOS (ABSOLUTE): 0.5 10*3/uL — ABNORMAL HIGH (ref 0.0–0.4)
Eos: 5 %
Hematocrit: 50.4 % — ABNORMAL HIGH (ref 34.0–46.6)
Hemoglobin: 16.4 g/dL — ABNORMAL HIGH (ref 11.1–15.9)
Immature Grans (Abs): 0 10*3/uL (ref 0.0–0.1)
Immature Granulocytes: 0 %
Lymphocytes Absolute: 2.7 10*3/uL (ref 0.7–3.1)
Lymphs: 31 %
MCH: 30.5 pg (ref 26.6–33.0)
MCHC: 32.5 g/dL (ref 31.5–35.7)
MCV: 94 fL (ref 79–97)
Monocytes Absolute: 0.6 10*3/uL (ref 0.1–0.9)
Monocytes: 7 %
Neutrophils Absolute: 5.1 10*3/uL (ref 1.4–7.0)
Neutrophils: 56 %
Platelets: 229 10*3/uL (ref 150–450)
RBC: 5.38 x10E6/uL — ABNORMAL HIGH (ref 3.77–5.28)
RDW: 12.5 % (ref 11.7–15.4)
WBC: 8.9 10*3/uL (ref 3.4–10.8)

## 2023-09-14 LAB — VITAMIN D 25 HYDROXY (VIT D DEFICIENCY, FRACTURES): Vit D, 25-Hydroxy: 56.2 ng/mL (ref 30.0–100.0)

## 2023-09-14 LAB — LIPID PANEL
Cholesterol, Total: 105 mg/dL (ref 100–199)
HDL: 20 mg/dL — ABNORMAL LOW (ref >39)
LDL CALC COMMENT:: 5.3 ratio — ABNORMAL HIGH (ref 0.0–4.4)
LDL Chol Calc (NIH): 58 mg/dL (ref 0–99)
Triglycerides: 153 mg/dL — ABNORMAL HIGH (ref 0–149)
VLDL Cholesterol Cal: 27 mg/dL (ref 5–40)

## 2023-09-14 LAB — TSH+FREE T4
Free T4: 1.77 ng/dL (ref 0.82–1.77)
TSH: 1 u[IU]/mL (ref 0.450–4.500)

## 2023-10-25 DIAGNOSIS — D225 Melanocytic nevi of trunk: Secondary | ICD-10-CM | POA: Diagnosis not present

## 2023-10-25 DIAGNOSIS — L218 Other seborrheic dermatitis: Secondary | ICD-10-CM | POA: Diagnosis not present

## 2023-10-25 DIAGNOSIS — L821 Other seborrheic keratosis: Secondary | ICD-10-CM | POA: Diagnosis not present

## 2023-10-25 DIAGNOSIS — D485 Neoplasm of uncertain behavior of skin: Secondary | ICD-10-CM | POA: Diagnosis not present

## 2023-10-25 DIAGNOSIS — D1801 Hemangioma of skin and subcutaneous tissue: Secondary | ICD-10-CM | POA: Diagnosis not present

## 2023-12-12 DIAGNOSIS — Z4689 Encounter for fitting and adjustment of other specified devices: Secondary | ICD-10-CM | POA: Diagnosis not present

## 2023-12-12 DIAGNOSIS — N952 Postmenopausal atrophic vaginitis: Secondary | ICD-10-CM | POA: Diagnosis not present

## 2024-03-05 ENCOUNTER — Encounter: Payer: Self-pay | Admitting: Nurse Practitioner

## 2024-03-05 ENCOUNTER — Ambulatory Visit: Payer: Self-pay

## 2024-03-05 ENCOUNTER — Telehealth: Payer: Self-pay

## 2024-03-05 ENCOUNTER — Ambulatory Visit: Admitting: Nurse Practitioner

## 2024-03-05 VITALS — BP 178/84 | HR 74 | Temp 97.3°F | Ht 65.0 in | Wt 170.2 lb

## 2024-03-05 DIAGNOSIS — J209 Acute bronchitis, unspecified: Secondary | ICD-10-CM | POA: Diagnosis not present

## 2024-03-05 DIAGNOSIS — J069 Acute upper respiratory infection, unspecified: Secondary | ICD-10-CM | POA: Insufficient documentation

## 2024-03-05 MED ORDER — PREDNISONE 10 MG (21) PO TBPK: ORAL_TABLET | ORAL | 0 refills | Status: DC

## 2024-03-05 MED ORDER — ALBUTEROL SULFATE HFA 108 (90 BASE) MCG/ACT IN AERS: 2.0000 | INHALATION_SPRAY | Freq: Four times a day (QID) | RESPIRATORY_TRACT | 0 refills | Status: AC | PRN

## 2024-03-05 MED ORDER — ALBUTEROL SULFATE (2.5 MG/3ML) 0.083% IN NEBU: 2.5000 mg | INHALATION_SOLUTION | Freq: Four times a day (QID) | RESPIRATORY_TRACT | 1 refills | Status: DC | PRN

## 2024-03-05 MED ORDER — AZITHROMYCIN 250 MG PO TABS: ORAL_TABLET | ORAL | 0 refills | Status: AC

## 2024-03-05 MED ORDER — FLUTICASONE PROPIONATE 50 MCG/ACT NA SUSP: 2.0000 | Freq: Every day | NASAL | 6 refills | Status: AC

## 2024-03-05 NOTE — Addendum Note (Signed)
 Addended by: DEITRA MORTON HUMMER, NENA on: 03/05/2024 04:05 PM   Modules accepted: Orders

## 2024-03-05 NOTE — Progress Notes (Addendum)
 Subjective:  Patient ID: Madison [name], female    DOB: 03/04/1946, 78 y.o.   MRN: [id number]  Patient Care Team: Jolinda Norene HERO, DO as PCP - General (Family Medicine) Lavona Agent, MD as PCP - Cardiology (Cardiology) Alferd Martinis, MD (Internal Medicine) Pernell Locus, MD (Hematology and Oncology) Lavona Agent, MD (Cardiology) Obie Princella HERO, MD (Inactive) as Consulting Physician (Gastroenterology) Areta Cotta, MD as Referring Physician (Endocrinology) Mavis Purchase, MD as Consulting Physician (Neurosurgery) Fadeyi, Oluwatoyin Alaba, NP (Urology) Nancyann DOROTHA Ebbing, M.D., PA   Chief Complaint:  Wheezing (Symptoms for 1 week), Shortness of Breath, and Cough   HPI: Madison [name] is a 78 y.o. female presenting on 03/05/2024 for Wheezing (Symptoms for 1 week), Shortness of Breath, and Cough   Discussed the use of AI scribe software for clinical note transcription with the patient, who gave verbal consent to proceed.  History of Present Illness Madison [name] is a 78 year old female who presents with worsening sinus congestion, cough, and shortness of breath.  Her symptoms began last week with sinus congestion and have progressively worsened over the weekend. She is experiencing drainage, coughing up phlegm, and shortness of breath. The phlegm is described as pale yellow. No fever has been noted.  She has been taking cetirizine over the counter for allergy-related drainage, which has provided some relief. She has not taken any medication specifically for the cough.  She smokes about half a pack of cigarettes a day. She reports she has never been told she has COPD. She is allergic to Wellbutrin, Chantix, Zetia , bananas, and latex.      Relevant past medical, surgical, family, and social history reviewed and updated as indicated.  Allergies and medications reviewed and updated. Data reviewed: Chart in Epic.   Past Medical History:  Diagnosis Date   Anxiety     CAD (coronary artery disease)    PCI of an occluded right coronary artery 2001. 70% LAD stenosis, 30% circumflex stenosis.   Diverticulosis    Hyperlipidemia    Hypertension    IBS (irritable bowel syndrome)    Leukocytosis 01/04/2013   This is followed by the hematologist     Leukocytosis, unspecified 07/24/2012   Myocardial infarction [facility]) 2001   Need for prophylactic hormone replacement therapy (postmenopausal)    Osteopenia    Osteoporosis    PONV (postoperative nausea and vomiting)    Thyroid  cancer (HCC) 2004   Dr. Arty    Thyroid  nodule 2004   Tobacco abuse     Past Surgical History:  Procedure Laterality Date   APPENDECTOMY  1955   BACK SURGERY  06/2000   CARDIAC CATHETERIZATION  2001   CHOLECYSTECTOMY  07/2002   EYE SURGERY Bilateral 2019   cataract   LIPOMA EXCISION Right 08/2003   r arm    LUMBAR LAMINECTOMY/DECOMPRESSION MICRODISCECTOMY Left 03/20/2020   Procedure: LEFT LUMBAR FOUR-FIVE MICRODISCECTOMY;  Surgeon: Mavis Purchase, MD;  Location: [facility] OR;  Service: Neurosurgery;  Laterality: Left;   peserie     THYROIDECTOMY Bilateral 02/2003    Social History   Socioeconomic History   Marital status: Married    Spouse name: Not on file   Number of children: 2   Years of education: Not on file   Highest education level: Associate degree: occupational, scientist, product/process development, or vocational program  Occupational History   Occupation: Retired     Comment: Optometrist  Tobacco Use   Smoking status: Every Day    Current packs/day:  1.00    Average packs/day: 1 pack/day for 40.0 years (40.0 ttl pk-yrs)    Types: Cigarettes   Smokeless tobacco: Never  Vaping Use   Vaping status: Never Used  Substance and Sexual Activity   Alcohol use: No   Drug use: No   Sexual activity: Not Currently  Other Topics Concern   Not on file  Social History Narrative   Dr.  Charolett -(surgeon) [phone]   Dr. Author- Endocrinologist  [facility]    Social Drivers of Health    Financial Resource Strain: Low Risk  (09/06/2023)   Overall Financial Resource Strain (CARDIA)    Difficulty of Paying Living Expenses: Not hard at all  Food Insecurity: Low Risk  (12/12/2023)   Received from [facility]   Hunger Vital Sign    Within the past 12 months, the food you bought just didn't last and you didn't have money to get more. : Never true    Within the past 12 months, you worried that your food would run out before you got money to buy more: Never true  Transportation Needs: No Transportation Needs (12/12/2023)   Received from [facility]    In the past 12 months, has lack of reliable transportation kept you from medical appointments, meetings, work or from getting things needed for daily living? : No  Physical Activity: Insufficiently Active (09/06/2023)   Exercise Vital Sign    Days of Exercise per Week: 2 days    Minutes of Exercise per Session: 20 min  Stress: No Stress Concern Present (09/06/2023)   [employer] of Occupational [employer] - Occupational Stress Questionnaire    Feeling of Stress : Only a little  Social Connections: Moderately Integrated (09/06/2023)   Social Connection and Isolation Panel    Frequency of Communication with Friends and Family: More than three times a week    Frequency of Social Gatherings with Friends and Family: Patient declined    Attends Religious Services: Never    Database Administrator or Organizations: Yes    Attends Engineer, Structural: More than 4 times per year    Marital Status: Married  Catering Manager Violence: Not At Risk (05/18/2023)   Received from [facility]   HITS    Over the last 12 months how often did your partner physically hurt you?: Never    Over the last 12 months how often did your partner insult you or talk down to you?: Never    Over the last 12 months how often did your partner threaten you with physical harm?: Never    Over the last 12 months how often did your  partner scream or curse at you?: Never    Outpatient Encounter Medications as of 03/05/2024  Medication Sig   albuterol  (VENTOLIN  HFA) 108 (90 Base) MCG/ACT inhaler Inhale 2 puffs into the lungs every 6 (six) hours as needed for wheezing or shortness of breath.   amLODipine  (NORVASC ) 5 MG tablet Take 1 tablet (5 mg total) by mouth daily.   aspirin  EC 81 MG tablet Take 1 tablet (81 mg total) by mouth daily. Swallow whole.   azithromycin  (ZITHROMAX ) 250 MG tablet Take 2 tablets on day 1, then 1 tablet daily on days 2 through 5   desvenlafaxine  (PRISTIQ ) 50 MG 24 hr tablet Take 1 tablet (50 mg total) by mouth daily.   estradiol (ESTRACE) 0.1 MG/GM vaginal cream Place vaginally 3 (three) times a week.   fenofibrate  160 MG tablet Take  1 tablet (160 mg total) by mouth daily.   fluticasone  (FLONASE ) 50 MCG/ACT nasal spray Place 2 sprays into both nostrils daily.   Fluticasone -Umeclidin-Vilant (TRELEGY ELLIPTA ) 200-62.5-25 MCG/ACT AEPB Inhale 1 puff into the lungs daily.   levothyroxine  (SYNTHROID ) 125 MCG tablet Take 125 mcg by mouth See admin instructions.   LORazepam  (ATIVAN ) 1 MG tablet Take 0.5-1 tablets (0.5-1 mg total) by mouth daily as needed for anxiety.   losartan  (COZAAR ) 100 MG tablet Take 1 tablet (100 mg total) by mouth daily.   Melatonin 5 MG TABS Take 5 mg by mouth at bedtime as needed (sleep).   metoprolol  tartrate (LOPRESSOR ) 50 MG tablet TAKE 1/2  TABLET by mouth  twice a DAY   nitroGLYCERIN  (NITROSTAT ) 0.4 MG SL tablet Place 1 tablet (0.4 mg total) under the tongue every 5 (five) minutes as needed for chest pain.   predniSONE  (STERAPRED UNI-PAK 21 TAB) 10 MG (21) TBPK tablet As directed x 6 days   rosuvastatin  (CRESTOR ) 40 MG tablet Take 1 tablet (40 mg total) by mouth daily. 1 tablet daily   Vitamin D , Ergocalciferol , (DRISDOL ) 1.25 MG (50000 UNIT) CAPS capsule Take 1 capsule (50,000 Units total) by mouth every 7 (seven) days.   [DISCONTINUED] albuterol  (PROVENTIL ) (2.5 MG/3ML)  0.083% nebulizer solution Take 3 mLs (2.5 mg total) by nebulization every 6 (six) hours as needed for wheezing or shortness of breath.   No facility-administered encounter medications on file as of 03/05/2024.    Allergies  Allergen Reactions   Banana Itching and Swelling    Throat and ears itch,ears feel like they swell   Bupropion Hcl     REACTION: paranoia   Chantix [Varenicline]     paranoia   Latex Rash   Zetia  [Ezetimibe ]     Myalgia    Review of Systems  Constitutional:  Negative for chills and fever.  HENT:  Positive for congestion. Negative for ear pain and sore throat.   Respiratory:  Positive for cough, shortness of breath and wheezing.        Light yellow  Cardiovascular:  Negative for chest pain and leg swelling.  Gastrointestinal:  Negative for blood in stool, constipation, diarrhea, nausea and vomiting.  Skin:  Negative for itching and rash.  Neurological:  Negative for dizziness and headaches.         Objective:  BP (!) 178/84   Pulse 74   Temp (!) 97.3 F (36.3 C) (Temporal)   Ht 5' 5 (1.651 m)   Wt 170 lb 3.2 oz (77.2 kg)   SpO2 92%   BMI 28.32 kg/m    Wt Readings from Last 3 Encounters:  03/05/24 170 lb 3.2 oz (77.2 kg)  09/13/23 172 lb (78 kg)  06/29/23 168 lb (76.2 kg)    Physical Exam Vitals and nursing note reviewed.  Constitutional:      General: She is not in acute distress. HENT:     Head: Normocephalic and atraumatic.     Right Ear: Tympanic membrane, ear canal and external ear normal. There is no impacted cerumen.     Left Ear: Tympanic membrane, ear canal and external ear normal. There is no impacted cerumen.     Nose: Congestion present.     Mouth/Throat:     Mouth: Mucous membranes are moist.  Eyes:     General: No scleral icterus.    Extraocular Movements: Extraocular movements intact.     Conjunctiva/sclera: Conjunctivae normal.     Pupils: Pupils are equal, round,  and reactive to light.  Cardiovascular:     Heart  sounds: Normal heart sounds.  Pulmonary:     Breath sounds: Examination of the right-lower field reveals wheezing. Examination of the left-lower field reveals wheezing. Wheezing present.  Musculoskeletal:        General: Normal range of motion.     Right lower leg: No edema.     Left lower leg: No edema.  Skin:    General: Skin is warm and dry.     Findings: No rash.  Neurological:     Mental Status: She is alert and oriented to person, place, and time.  Psychiatric:        Mood and Affect: Mood normal.        Behavior: Behavior normal.        Thought Content: Thought content normal.        Judgment: Judgment normal.    Physical Exam HEENT: Mild sinus pressure on palpation. CHEST: Lungs clear to auscultation bilaterally with mild wheezing.     Results for orders placed or performed in visit on 09/13/23  Bayer DCA Hb A1c Waived   Collection Time: 09/13/23  9:21 AM  Result Value Ref Range   HB A1C (BAYER DCA - WAIVED) 6.3 (H) 4.8 - 5.6 %  TSH + free T4   Collection Time: 09/13/23  9:23 AM  Result Value Ref Range   TSH 1.000 0.450 - 4.500 uIU/mL   Free T4 1.77 0.82 - 1.77 ng/dL  RFE85+ZHQM   Collection Time: 09/13/23  9:23 AM  Result Value Ref Range   Glucose 145 (H) 70 - 99 mg/dL   BUN 22 8 - 27 mg/dL   Creatinine, Ser 9.15 0.57 - 1.00 mg/dL   eGFR 72 >40 fO/fpw/8.26   BUN/Creatinine Ratio 26 12 - 28   Sodium 138 134 - 144 mmol/L   Potassium 4.6 3.5 - 5.2 mmol/L   Chloride 104 96 - 106 mmol/L   CO2 17 (L) 20 - 29 mmol/L   Calcium  9.8 8.7 - 10.3 mg/dL   Total Protein 7.3 6.0 - 8.5 g/dL   Albumin 4.5 3.8 - 4.8 g/dL   Globulin, Total 2.8 1.5 - 4.5 g/dL   Bilirubin Total 0.3 0.0 - 1.2 mg/dL   Alkaline Phosphatase 75 44 - 121 IU/L   AST 25 0 - 40 IU/L   ALT 23 0 - 32 IU/L  Lipid Panel   Collection Time: 09/13/23  9:23 AM  Result Value Ref Range   Cholesterol, Total 105 100 - 199 mg/dL   Triglycerides 846 (H) 0 - 149 mg/dL   HDL 20 (L) >60 mg/dL   VLDL Cholesterol  Cal 27 5 - 40 mg/dL   LDL Chol Calc (NIH) 58 0 - 99 mg/dL   Chol/HDL Ratio 5.3 (H) 0.0 - 4.4 ratio  CBC with Differential   Collection Time: 09/13/23  9:23 AM  Result Value Ref Range   WBC 8.9 3.4 - 10.8 x10E3/uL   RBC 5.38 (H) 3.77 - 5.28 x10E6/uL   Hemoglobin 16.4 (H) 11.1 - 15.9 g/dL   Hematocrit 49.5 (H) 65.9 - 46.6 %   MCV 94 79 - 97 fL   MCH 30.5 26.6 - 33.0 pg   MCHC 32.5 31.5 - 35.7 g/dL   RDW 87.4 88.2 - 84.5 %   Platelets 229 150 - 450 x10E3/uL   Neutrophils 56 Not Estab. %   Lymphs 31 Not Estab. %   Monocytes 7 Not Estab. %  Eos 5 Not Estab. %   Basos 1 Not Estab. %   Neutrophils Absolute 5.1 1.4 - 7.0 x10E3/uL   Lymphocytes Absolute 2.7 0.7 - 3.1 x10E3/uL   Monocytes Absolute 0.6 0.1 - 0.9 x10E3/uL   EOS (ABSOLUTE) 0.5 (H) 0.0 - 0.4 x10E3/uL   Basophils Absolute 0.1 0.0 - 0.2 x10E3/uL   Immature Granulocytes 0 Not Estab. %   Immature Grans (Abs) 0.0 0.0 - 0.1 x10E3/uL  VITAMIN D  25 Hydroxy (Vit-D Deficiency, Fractures)   Collection Time: 09/13/23  9:23 AM  Result Value Ref Range   Vit D, 25-Hydroxy 56.2 30.0 - 100.0 ng/mL       Pertinent labs & imaging results that were available during my care of the patient were reviewed by me and considered in my medical decision making.  Assessment & Plan:  Madison [name] was seen today for wheezing, shortness of breath and cough.  Diagnoses and all orders for this visit:  Acute bronchitis, unspecified organism -     fluticasone  (FLONASE ) 50 MCG/ACT nasal spray; Place 2 sprays into both nostrils daily. -     predniSONE  (STERAPRED UNI-PAK 21 TAB) 10 MG (21) TBPK tablet; As directed x 6 days -     azithromycin  (ZITHROMAX ) 250 MG tablet; Take 2 tablets on day 1, then 1 tablet daily on days 2 through 5 -     Discontinue: albuterol  (PROVENTIL ) (2.5 MG/3ML) 0.083% nebulizer solution; Take 3 mLs (2.5 mg total) by nebulization every 6 (six) hours as needed for wheezing or shortness of breath.  URI with cough and congestion  Other  orders -     albuterol  (VENTOLIN  HFA) 108 (90 Base) MCG/ACT inhaler; Inhale 2 puffs into the lungs every 6 (six) hours as needed for wheezing or shortness of breath.     Assessment and Plan Letcher is a 78 year old Caucasian female seen today for acute bronchitis, no acute distress Assessment & Plan Acute bronchitis Symptoms include nasal drainage, cough with pale yellow sputum, and shortness of breath. Wheezing present. No fever. Smoking may contribute. - Prescribed prednisone  pack with loading dose followed by tapering schedule. 10 mg #21 - Prescribed Flonase . - zpack #6 - Prescribed Ventolin  inhaler for breathing difficulties. - Advised smoking cessation until symptoms improve. - Encouraged hydration and rest. - Advised avoiding cold exposure.  Tobacco use Current smoker, half a pack per day. Smoking exacerbates respiratory symptoms and health risks. - Advised smoking cessation until respiratory symptoms improve.      Continue all other maintenance medications.  Follow up plan: Return if symptoms worsen or fail to improve.   Continue healthy lifestyle choices, including diet (rich in fruits, vegetables, and lean proteins, and low in salt and simple carbohydrates) and exercise (at least 30 minutes of moderate physical activity daily).  Educational handout given for    Clinical References  Acute Bronchitis, Adult  Acute bronchitis is when air tubes in the lungs (bronchi) suddenly get swollen. The condition can make it hard for you to breathe. In adults, acute bronchitis usually goes away within 2 weeks. A cough caused by bronchitis may last up to 3 weeks. Smoking, allergies, and asthma can make the condition worse. What are the causes? Germs that cause cold and flu (viruses). The most common cause of this condition is the virus that causes the common cold. Bacteria. Substances that bother (irritate) the lungs, including: Smoke from cigarettes and other types of  tobacco. Dust and pollen. Fumes from chemicals, gases, or burned fuel. Indoor or outdoor air pollution.  What increases the risk? A weak body's defense system. This is also called the immune system. Any condition that affects your lungs and breathing, such as asthma. What are the signs or symptoms? A cough. Coughing up clear, yellow, or green mucus. Making high-pitched whistling sounds when you breathe, most often when you breathe out (wheezing). Runny or stuffy nose. Having too much mucus in your lungs (chest congestion). Shortness of breath. Body aches. A sore throat. How is this treated? Acute bronchitis may go away over time without treatment. Your doctor may tell you to: Drink more fluids. This will help thin your mucus so it is easier to cough up. Use a device that gets medicine into your lungs (inhaler). Use a vaporizer or a humidifier. These are machines that add water to the air. This helps with coughing and poor breathing. Take a medicine that thins mucus and helps clear it from your lungs. Take a medicine that prevents or stops coughing. It is not common to take an antibiotic medicine for this condition. Follow these instructions at home:  Take over-the-counter and prescription medicines only as told by your doctor. Use an inhaler, vaporizer, or humidifier as told by your doctor. Take two teaspoons (10 mL) of honey at bedtime. This helps lessen your coughing at night. Drink enough fluid to keep your pee (urine) pale yellow. Do not smoke or use any products that contain nicotine or tobacco. If you need help quitting, ask your doctor. Get a lot of rest. Return to your normal activities when your doctor says that it is safe. Keep all follow-up visits. How is this prevented?  Wash your hands often with soap and water for at least 20 seconds. If you cannot use soap and water, use hand sanitizer. Avoid contact with people who have cold symptoms. Try not to touch your mouth,  nose, or eyes with your hands. Avoid breathing in smoke or chemical fumes. Make sure to get the flu shot every year. Contact a doctor if: Your symptoms do not get better in 2 weeks. You have trouble coughing up the mucus. Your cough keeps you awake at night. You have a fever. Get help right away if: You cough up blood. You have chest pain. You have very bad shortness of breath. You faint or keep feeling like you are going to faint. You have a very bad headache. Your fever or chills get worse. These symptoms may be an emergency. Get help right away. Call your local emergency services (911 in the [place] Do not wait to see if the symptoms will go away. Do not drive yourself to the hospital. Summary Acute bronchitis is when air tubes in the lungs (bronchi) suddenly get swollen. In adults, acute bronchitis usually goes away within 2 weeks. Drink more fluids. This will help thin your mucus so it is easier to cough up. Take over-the-counter and prescription medicines only as told by your doctor. Contact a doctor if your symptoms do not improve after 2 weeks of treatment. This information is not intended to replace advice given to you by your health care provider. Make sure you discuss any questions you have with your health care provider. Document Revised: 08/06/2020 Document Reviewed: 08/06/2020 Elsevier Patient Education  2024 [employer] Inc. Viral Respiratory Infection A respiratory infection is an illness that affects part of the respiratory system, such as the lungs, nose, or throat. A respiratory infection that is caused by a virus is called a viral respiratory infection. Common types of viral respiratory infections  include: A cold. The flu (influenza). A respiratory syncytial virus (RSV) infection. What are the causes? This condition is caused by a virus. The virus may spread through contact with droplets or direct contact with infected people or their mucus or secretions. The virus  may spread from person to person (is contagious). What are the signs or symptoms? Symptoms of this condition include: A stuffy or runny nose. A sore throat or cough. Shortness of breath or difficulty breathing. Yellow or green mucus (sputum). Other symptoms may include: A fever. Sweating or chills. Fatigue. Achy muscles. A headache. How is this diagnosed? This condition may be diagnosed based on: Your symptoms. A physical exam. Testing of secretions from the nose or throat. Chest X-ray. How is this treated? This condition may be treated with medicines, such as: Antiviral medicine. This may shorten the length of time a person has symptoms. Expectorants. These make it easier to cough up mucus. Decongestant nasal sprays. Acetaminophen  or NSAIDs, such as ibuprofen , to relieve fever and pain. Antibiotic medicines are not prescribed for viral infections.This is because antibiotics are designed to kill bacteria. They do not kill viruses. Follow these instructions at home: Managing pain and congestion Take over-the-counter and prescription medicines only as told by your health care provider. If you have a sore throat, gargle with a mixture of salt and water 3-4 times a day or as needed. To make salt water, completely dissolve -1 tsp (3-6 g) of salt in 1 cup (237 mL) of warm water. Use nose drops made from salt water to ease congestion and soften raw skin around your nose. Take 2 tsp (10 mL) of honey at bedtime to lessen coughing at night. Do not give honey to children who are younger than 1 year. Drink enough fluid to keep your urine pale yellow. This helps prevent dehydration and helps loosen up mucus. General instructions  Rest as much as possible. Do not drink alcohol. Do not use any products that contain nicotine or tobacco. These products include cigarettes, chewing tobacco, and vaping devices, such as e-cigarettes. If you need help quitting, ask your health care provider. Keep  all follow-up visits. This is important. How is this prevented?     Get an annual flu shot. You may get the flu shot in late summer, fall, or winter. Ask your health care provider when you should get your flu shot. Avoid spreading your infection to other people. If you are sick: Wash your hands with soap and water often, especially after you cough or sneeze. Wash for at least 20 seconds. If soap and water are not available, use alcohol-based hand sanitizer. Cover your mouth when you cough. Cover your nose and mouth when you sneeze. Do not share cups or eating utensils. Clean commonly used objects often. Clean commonly touched surfaces. Stay home from work or school as told by your health care provider. Avoid contact with people who are sick during cold and flu season. This is generally fall and winter. Contact a health care provider if: Your symptoms last for 10 days or longer. Your symptoms get worse over time. You have severe sinus pain in your face or forehead. The glands in your jaw or neck become very swollen. You have shortness of breath. Get help right away if you: Feel pain or pressure in your chest. Have trouble breathing. Faint or feel like you will faint. Have severe and persistent vomiting. Feel confused or disoriented. These symptoms may represent a serious problem that is an emergency. Do  not wait to see if the symptoms will go away. Get medical help right away. Call your local emergency services (911 in the [place] Do not drive yourself to the hospital. Summary A respiratory infection is an illness that affects part of the respiratory system, such as the lungs, nose, or throat. A respiratory infection that is caused by a virus is called a viral respiratory infection. Common types of viral respiratory infections include a cold, influenza, and respiratory syncytial virus (RSV) infection. Symptoms of this condition include a stuffy or runny nose, cough, fatigue, achy muscles,  sore throat, and fevers or chills. Antibiotic medicines are not prescribed for viral infections. This is because antibiotics are designed to kill bacteria. They are not effective against viruses. This information is not intended to replace advice given to you by your health care provider. Make sure you discuss any questions you have with your health care provider. Document Revised: 07/10/2020 Document Reviewed: 07/10/2020 Elsevier Patient Education  2024 [employer] Inc. Upper Respiratory Infection, Adult An upper respiratory infection (URI) affects the nose, throat, and upper airways that lead to the lungs. The most common type of URI is often called the common cold. URIs usually get better on their own, without medical treatment. What are the causes? A URI is caused by a germ (virus). You may catch these germs by: Breathing in droplets from an infected person's cough or sneeze. Touching something that has the germ on it (is contaminated) and then touching your mouth, nose, or eyes. What increases the risk? You are more likely to get a URI if: You are very young or very old. You have close contact with others, such as at work, school, or a health care facility. You smoke. You have long-term (chronic) heart or lung disease. You have a weakened disease-fighting system (immune system). You have nasal allergies or asthma. You have a lot of stress. You have poor nutrition. What are the signs or symptoms? Runny or stuffy (congested) nose. Cough. Sneezing. Sore throat. Headache. Feeling tired (fatigue). Fever. Not wanting to eat as much as usual. Pain in your forehead, behind your eyes, and over your cheekbones (sinus pain). Muscle aches. Redness or irritation of the eyes. Pressure in the ears or face. How is this treated? URIs usually get better on their own within 7-10 days. Medicines cannot cure URIs, but your doctor may recommend certain medicines to help relieve symptoms, such  as: Over-the-counter cold medicines. Medicines to reduce coughing (cough suppressants). Coughing is a type of defense against infection that helps to clear the nose, throat, windpipe, and lungs (respiratory system). Take these medicines only as told by your doctor. Medicines to lower your fever. Follow these instructions at home: Activity Rest as needed. If you have a fever, stay home from work or school until your fever is gone, or until your doctor says you may return to work or school. You should stay home until you cannot spread the infection anymore (you are not contagious). Your doctor may have you wear a face mask so you have less risk of spreading the infection. Relieving symptoms Rinse your mouth often with salt water. To make salt water, dissolve -1 tsp (3-6 g) of salt in 1 cup (237 mL) of warm water. Use a cool-mist humidifier to add moisture to the air. This can help you breathe more easily. Eating and drinking  Drink enough fluid to keep your pee (urine) pale yellow. Eat soups and other clear broths. General instructions  Take over-the-counter and prescription  medicines only as told by your doctor. Do not smoke or use any products that contain nicotine or tobacco. If you need help quitting, ask your doctor. Avoid being where people are smoking (avoid secondhand smoke). Stay up to date on all your shots (immunizations), and get the flu shot every year. Keep all follow-up visits. How to prevent the spread of infection to others  Wash your hands with soap and water for at least 20 seconds. If you cannot use soap and water, use hand sanitizer. Avoid touching your mouth, face, eyes, or nose. Cough or sneeze into a tissue or your sleeve or elbow. Do not cough or sneeze into your hand or into the air. Contact a doctor if: You are getting worse, not better. You have any of these: A fever or chills. Brown or red mucus in your nose. Yellow or brown fluid (discharge)coming from  your nose. Pain in your face, especially when you bend forward. Swollen neck glands. Pain when you swallow. White areas in the back of your throat. Get help right away if: You have shortness of breath that gets worse. You have very bad or constant: Headache. Ear pain. Pain in your forehead, behind your eyes, and over your cheekbones (sinus pain). Chest pain. You have long-lasting (chronic) lung disease along with any of these: Making high-pitched whistling sounds when you breathe, most often when you breathe out (wheezing). Long-lasting cough (more than 14 days). Coughing up blood. A change in your usual mucus. You have a stiff neck. You have changes in your: Vision. Hearing. Thinking. Mood. These symptoms may be an emergency. Get help right away. Call 911. Do not wait to see if the symptoms will go away. Do not drive yourself to the hospital. Summary An upper respiratory infection (URI) is caused by a germ (virus). The most common type of URI is often called the common cold. URIs usually get better within 7-10 days. Take over-the-counter and prescription medicines only as told by your doctor. This information is not intended to replace advice given to you by your health care provider. Make sure you discuss any questions you have with your health care provider. Document Revised: 11/05/2020 Document Reviewed: 11/05/2020 Elsevier Patient Education  2024 [employer]  The above assessment and management plan was discussed with the patient. The patient verbalized understanding of and has agreed to the management plan. Patient is aware to call the clinic if they develop any new symptoms or if symptoms persist or worsen. Patient is aware when to return to the clinic for a follow-up visit. Patient educated on when it is appropriate to go to the emergency department.   Katora Fini [place] Thompson, DNP [facility] [address] [place], [place] [address] [phone]

## 2024-03-05 NOTE — Telephone Encounter (Signed)
 Copied from CRM #[id number]. Topic: Clinical - Medication Question >> Mar 05, 2024  2:52 PM Myrick T wrote: Reason for CRM: patient called to see if provider meant to prescribe the Albuterol  nebulizer solution because she does not have the machine. Please f/u with patient for clarity

## 2024-03-05 NOTE — Telephone Encounter (Signed)
 Pt aware and verbalized understanding.

## 2024-03-05 NOTE — Telephone Encounter (Signed)
 Appt made.

## 2024-03-05 NOTE — Telephone Encounter (Signed)
 FYI Only or Action Required?: FYI only for provider: appointment scheduled on Today.  Patient was last seen in primary care on 09/13/2023 by Jolinda Norene HERO, DO.  Called Nurse Triage reporting Breathing Problem.  Symptoms began [name] week ago.  Interventions attempted: Rest, hydration, or home remedies.  Symptoms are: gradually worsening.  Triage Disposition: See HCP Within 4 Hours (Or PCP Triage)  Patient/caregiver understands and will follow disposition?: Yes  Copied from CRM #[id number]. Topic: Clinical - Red Word Triage >> Mar 05, 2024  9:11 AM Madison [name] wrote: Red Word that prompted transfer to Nurse Triage: Patient is having wheezing and sob. Reason for Disposition  [1] MILD difficulty breathing (e.g., minimal/no SOB at rest, SOB with walking, pulse < 100) AND [2] NEW-onset or WORSE than normal  Answer Assessment - Initial Assessment Questions 1. RESPIRATORY STATUS: Describe your breathing? (e.g., wheezing, shortness of breath, unable to speak, severe coughing)      Wheezing, Dyspnea  2. ONSET: When did this breathing problem begin?      Last Week  3. PATTERN Does the difficult breathing come and go, or has it been constant since it started?      Intermittent, Exacerbated with activity  4. SEVERITY: How bad is your breathing? (e.g., mild, moderate, severe)      Moderate  5. RECURRENT SYMPTOM: Have you had difficulty breathing before? If Yes, ask: When was the last time? and What happened that time?      No  6. CARDIAC HISTORY: Do you have any history of heart disease? (e.g., heart attack, angina, bypass surgery, angioplasty)      HTN, Heart Attack (2002)  7. LUNG HISTORY: Do you have any history of lung disease?  (e.g., pulmonary embolus, asthma, emphysema)     No  8. CAUSE: What do you think is causing the breathing problem?      Unsure  9. OTHER SYMPTOMS: Do you have any other symptoms? (e.g., chest pain, cough, dizziness, fever, runny  nose)     Cough-Productive (Yellow), Runny Nose  10. O2 SATURATION MONITOR:  Do you use an oxygen saturation monitor (pulse oximeter) at home? If Yes, ask: What is your reading (oxygen level) today? What is your usual oxygen saturation reading? (e.g., 95%)       Unsure  11. PREGNANCY: Is there any chance you are pregnant? When was your last menstrual period?       No and No  12. TRAVEL: Have you traveled out of the country in the last month? (e.g., travel history, exposures)       No  Protocols used: Breathing Difficulty-[name]-AH

## 2024-03-09 ENCOUNTER — Ambulatory Visit (INDEPENDENT_AMBULATORY_CARE_PROVIDER_SITE_OTHER)

## 2024-03-09 VITALS — BP 178/84 | HR 74 | Ht 65.0 in | Wt 170.0 lb

## 2024-03-09 DIAGNOSIS — Z0000 Encounter for general adult medical examination without abnormal findings: Secondary | ICD-10-CM

## 2024-03-09 NOTE — Progress Notes (Signed)
 Chief Complaint  Patient presents with   Medicare Wellness     Subjective:   Madison [name] is a 78 y.o. female who presents for a Medicare Annual Wellness Visit.  Allergies (verified) Banana, Bupropion hcl, Chantix [varenicline], Latex, and Zetia  [ezetimibe ]   History: Past Medical History:  Diagnosis Date   Allergy    Anxiety    Arthritis    CAD (coronary artery disease)    PCI of an occluded right coronary artery 2001. 70% LAD stenosis, 30% circumflex stenosis.   Cataract    Depression    Diverticulosis    Heart murmur    Hyperlipidemia    Hypertension    IBS (irritable bowel syndrome)    Leukocytosis 01/04/2013   This is followed by the hematologist     Leukocytosis, unspecified 07/24/2012   Myocardial infarction [facility]) 2001   Need for prophylactic hormone replacement therapy (postmenopausal)    Osteopenia    Osteoporosis    PONV (postoperative nausea and vomiting)    Thyroid  cancer (HCC) 2004   Dr. Arty    Thyroid  nodule 2004   Tobacco abuse    Past Surgical History:  Procedure Laterality Date   APPENDECTOMY  1955   BACK SURGERY  06/2000   CARDIAC CATHETERIZATION  2001   CHOLECYSTECTOMY  07/2002   EYE SURGERY Bilateral 2019   cataract   LIPOMA EXCISION Right 08/2003   r arm    LUMBAR LAMINECTOMY/DECOMPRESSION MICRODISCECTOMY Left 03/20/2020   Procedure: LEFT LUMBAR FOUR-FIVE MICRODISCECTOMY;  Surgeon: Mavis Purchase, MD;  Location: [facility] OR;  Service: Neurosurgery;  Laterality: Left;   peserie     SPINE SURGERY     THYROIDECTOMY Bilateral 02/2003   TUBAL LIGATION     Family History  Problem Relation Age of Onset   Coronary artery disease Father    Heart disease Father    Cirrhosis Mother    Alcohol abuse Mother    Early death Mother    Cancer Other        family history    Hypertension Maternal Grandmother    Stroke Maternal Grandfather    Social History   Occupational History   Occupation: Retired     Comment: Optometrist   Tobacco Use   Smoking status: Every Day    Current packs/day: 1.00    Average packs/day: 1 pack/day for 40.0 years (40.0 ttl pk-yrs)    Types: Cigarettes   Smokeless tobacco: Never  Vaping Use   Vaping status: Never Used  Substance and Sexual Activity   Alcohol use: Never   Drug use: No   Sexual activity: Not Currently   Tobacco Counseling Ready to quit: No Counseling given: Yes  SDOH Screenings   Food Insecurity: No Food Insecurity (03/09/2024)  Housing: Low Risk  (03/09/2024)  Transportation Needs: No Transportation Needs (03/09/2024)  Utilities: Not At Risk (03/09/2024)  Alcohol Screen: Low Risk  (04/07/2023)  Depression (PHQ2-9): Low Risk  (03/09/2024)  Financial Resource Strain: Low Risk  (03/07/2024)  Physical Activity: Insufficiently Active (03/09/2024)  Social Connections: Moderately Integrated (03/09/2024)  Recent Concern: Social Connections - Moderately Isolated (03/07/2024)  Stress: No Stress Concern Present (03/09/2024)  Tobacco Use: High Risk (03/09/2024)  Health Literacy: Adequate Health Literacy (03/09/2024)   See flowsheets for full screening details  Depression Screen PHQ 2 & 9 Depression Scale- Over the past 2 weeks, how often have you been bothered by any of the following problems? Little interest or pleasure in doing things: 0 Feeling down, depressed, or  hopeless (PHQ Adolescent also includes...irritable): 0 PHQ-2 Total Score: 0 Trouble falling or staying asleep, or sleeping too much: 1 Feeling tired or having little energy: 0 Poor appetite or overeating (PHQ Adolescent also includes...weight loss): 0 Feeling bad about yourself - or that you are a failure or have let yourself or your family down: 0 Trouble concentrating on things, such as reading the newspaper or watching television (PHQ Adolescent also includes...like school work): 0 Moving or speaking so slowly that other people could have noticed. Or the opposite - being so fidgety or restless  that you have been moving around a lot more than usual: 0 Thoughts that you would be better off dead, or of hurting yourself in some way: 0 PHQ-9 Total Score: 2 If you checked off any problems, how difficult have these problems made it for you to do your work, take care of things at home, or get along with other people?: Not difficult at all  Depression Treatment Depression Interventions/Treatment : EYV7-0 Score <4 Follow-up Not Indicated     Goals Addressed   None    Visit info / Clinical Intake: Medicare Wellness Visit Type:: Subsequent Annual Wellness Visit Persons participating in visit:: patient Medicare Wellness Visit Mode:: Telephone If telephone:: video declined Because this visit was a virtual/telehealth visit:: vitals recorded from last visit If Telephone or Video please confirm:: I connected with the patient using audio enabled telemedicine application and verified that I am speaking with the correct person using two identifiers Patient Location:: home Provider Location:: home office Information given by:: patient Interpreter Needed?: No Pre-visit prep was completed: yes AWV questionnaire completed by patient prior to visit?: no Living arrangements:: lives with spouse/significant other Patient's Overall Health Status Rating: very good Typical amount of pain: none Does pain affect daily life?: no Are you currently prescribed opioids?: no  Dietary Habits and Nutritional Risks How many meals a day?: 3 Eats fruit and vegetables daily?: yes Most meals are obtained by: preparing own meals In the last 2 weeks, have you had any of the following?: none Diabetic:: no  Functional Status Activities of Daily Living (to include ambulation/medication): (Patient-Rptd) Independent Ambulation: (Patient-Rptd) Independent Medication Administration: Independent Home Management: (Patient-Rptd) Independent Manage your own finances?: yes Primary transportation is: driving Concerns  about hearing?: no  Fall Screening Falls in the past year?: 0 Number of falls in past year: 0 Was there an injury with Fall?: 0 Fall Risk Category Calculator: 0 Patient Fall Risk Level: Low Fall Risk  Fall Risk Patient at Risk for Falls Due to: No Fall Risks Fall risk Follow up: Falls evaluation completed; Education provided  Home and Transportation Safety: All rugs have non-skid backing?: yes All stairs or steps have railings?: yes Grab bars in the bathtub or shower?: yes Have non-skid surface in bathtub or shower?: yes Good home lighting?: yes Regular seat belt use?: yes Hospital stays in the last year:: no  Cognitive Assessment Difficulty concentrating, remembering, or making decisions? : no Will 6CIT or Mini Cog be Completed: yes What year is it?: 0 points What month is it?: 0 points Give patient an address phrase to remember (5 components): [address] [place] About what time is it?: 0 points Count backwards from 20 to 1: 0 points Say the months of the year in reverse: 0 points Repeat the address phrase from earlier: 0 points 6 CIT Score: 0 points  Advance Directives (For Healthcare) Does Patient Have a Medical Advance Directive?: No Would patient like information on creating  a medical advance directive?: No - Patient declined  Reviewed/Updated  Reviewed/Updated: Reviewed All (Medical, Surgical, Family, Medications, Allergies, Care Teams, Patient Goals); Medical History; Surgical History; Family History; Medications; Allergies; Care Teams; Patient Goals        Objective:    There were no vitals filed for this visit. There is no height or weight on file to calculate BMI.  Current Medications (verified) Outpatient Encounter Medications as of 03/09/2024  Medication Sig   albuterol  (VENTOLIN  HFA) 108 (90 Base) MCG/ACT inhaler Inhale 2 puffs into the lungs every 6 (six) hours as needed for wheezing or shortness of breath.   amLODipine  (NORVASC ) 5 MG  tablet Take 1 tablet (5 mg total) by mouth daily.   aspirin  EC 81 MG tablet Take 1 tablet (81 mg total) by mouth daily. Swallow whole.   azithromycin  (ZITHROMAX ) 250 MG tablet Take 2 tablets on day 1, then 1 tablet daily on days 2 through 5   desvenlafaxine  (PRISTIQ ) 50 MG 24 hr tablet Take 1 tablet (50 mg total) by mouth daily.   estradiol (ESTRACE) 0.1 MG/GM vaginal cream Place vaginally 3 (three) times a week.   fenofibrate  160 MG tablet Take 1 tablet (160 mg total) by mouth daily.   fluticasone  (FLONASE ) 50 MCG/ACT nasal spray Place 2 sprays into both nostrils daily.   levothyroxine  (SYNTHROID ) 125 MCG tablet Take 125 mcg by mouth See admin instructions.   LORazepam  (ATIVAN ) 1 MG tablet Take 0.5-1 tablets (0.5-1 mg total) by mouth daily as needed for anxiety.   losartan  (COZAAR ) 100 MG tablet Take 1 tablet (100 mg total) by mouth daily.   Melatonin 5 MG TABS Take 5 mg by mouth at bedtime as needed (sleep).   metoprolol  tartrate (LOPRESSOR ) 50 MG tablet TAKE 1/2  TABLET by mouth  twice a DAY   nitroGLYCERIN  (NITROSTAT ) 0.4 MG SL tablet Place 1 tablet (0.4 mg total) under the tongue every 5 (five) minutes as needed for chest pain.   predniSONE  (STERAPRED UNI-PAK 21 TAB) 10 MG (21) TBPK tablet As directed x 6 days   rosuvastatin  (CRESTOR ) 40 MG tablet Take 1 tablet (40 mg total) by mouth daily. 1 tablet daily   Vitamin D , Ergocalciferol , (DRISDOL ) 1.25 MG (50000 UNIT) CAPS capsule Take 1 capsule (50,000 Units total) by mouth every 7 (seven) days.   [DISCONTINUED] Fluticasone -Umeclidin-Vilant (TRELEGY ELLIPTA ) 200-62.5-25 MCG/ACT AEPB Inhale 1 puff into the lungs daily.   No facility-administered encounter medications on file as of 03/09/2024.   Hearing/Vision screen Hearing Screening - Comments:: Pt denies hearing dif Vision Screening - Comments:: Pt wear glasses/dr. Camillo cherre ov 2025 Immunizations and Health Maintenance Health Maintenance  Topic Date Due   Zoster Vaccines- Shingrix (1  of 2) 09/30/1964   Lung Cancer Screening  09/12/2024 (Originally 08/07/2013)   Bone Density Scan  09/12/2024 (Originally 07/21/2023)   COVID-19 Vaccine (8 - Pfizer risk 2025-26 season) 08/01/2024   Medicare Annual Wellness (AWV)  03/09/2025   DTaP/Tdap/Td (3 - Td or Tdap) 06/28/2030   Pneumococcal Vaccine: 50+ Years  Completed   Influenza Vaccine  Completed   Hepatitis C Screening  Completed   Meningococcal B Vaccine  Aged Out   Mammogram  Discontinued   Colonoscopy  Discontinued        Assessment/Plan:  This is a routine wellness examination for Elga.  Patient Care Team: Jolinda Norene HERO, DO as PCP - General (Family Medicine) Lavona Agent, MD as PCP - Cardiology (Cardiology) Alferd Martinis, MD (Internal Medicine) Pernell Locus, MD (Hematology  and Oncology) Lavona Agent, MD (Cardiology) Obie Princella HERO, MD (Inactive) as Consulting Physician (Gastroenterology) Areta Cotta, MD as Referring Physician (Endocrinology) Mavis Purchase, MD as Consulting Physician (Neurosurgery) Fadeyi, Oluwatoyin Alaba, NP (Urology) Nancyann DOROTHA Ebbing, M.D., PA  I have personally reviewed and noted the following in the patient's chart:   Medical and social history Use of alcohol, tobacco or illicit drugs  Current medications and supplements including opioid prescriptions. Functional ability and status Nutritional status Physical activity Advanced directives List of other physicians Hospitalizations, surgeries, and ER visits in previous 12 months Vitals Screenings to include cognitive, depression, and falls Referrals and appointments  No orders of the defined types were placed in this encounter.  In addition, I have reviewed and discussed with patient certain preventive protocols, quality metrics, and best practice recommendations. A written personalized care plan for preventive services as well as general preventive health recommendations were provided to patient.   Ozie Ned, CMA   03/09/2024   Return in 1 year (on 03/09/2025).  After Visit Summary: (MyChart) Due to this being a telephonic visit, the after visit summary with patients personalized plan was offered to patient via MyChart   Nurse Notes: pt declined shingles

## 2024-03-14 ENCOUNTER — Ambulatory Visit: Payer: Self-pay | Admitting: Family Medicine

## 2024-03-14 ENCOUNTER — Other Ambulatory Visit: Payer: Self-pay | Admitting: Family Medicine

## 2024-03-14 ENCOUNTER — Ambulatory Visit (INDEPENDENT_AMBULATORY_CARE_PROVIDER_SITE_OTHER)

## 2024-03-14 ENCOUNTER — Encounter: Payer: Self-pay | Admitting: Family Medicine

## 2024-03-14 VITALS — BP 116/69 | HR 63 | Temp 97.6°F | Ht 65.0 in | Wt 168.5 lb

## 2024-03-14 DIAGNOSIS — J449 Chronic obstructive pulmonary disease, unspecified: Secondary | ICD-10-CM | POA: Diagnosis not present

## 2024-03-14 DIAGNOSIS — Z780 Asymptomatic menopausal state: Secondary | ICD-10-CM | POA: Diagnosis not present

## 2024-03-14 DIAGNOSIS — Z79899 Other long term (current) drug therapy: Secondary | ICD-10-CM

## 2024-03-14 DIAGNOSIS — F411 Generalized anxiety disorder: Secondary | ICD-10-CM

## 2024-03-14 DIAGNOSIS — Z13820 Encounter for screening for osteoporosis: Secondary | ICD-10-CM | POA: Diagnosis not present

## 2024-03-14 DIAGNOSIS — M810 Age-related osteoporosis without current pathological fracture: Secondary | ICD-10-CM | POA: Diagnosis not present

## 2024-03-14 DIAGNOSIS — Z2821 Immunization not carried out because of patient refusal: Secondary | ICD-10-CM

## 2024-03-14 MED ORDER — LORAZEPAM 1 MG PO TABS: 0.5000 mg | ORAL_TABLET | Freq: Every day | ORAL | 1 refills | Status: AC | PRN

## 2024-03-14 MED ORDER — BREZTRI AEROSPHERE 160-9-4.8 MCG/ACT IN AERO: 2.0000 | INHALATION_SPRAY | Freq: Two times a day (BID) | RESPIRATORY_TRACT | 11 refills | Status: AC

## 2024-03-14 NOTE — Patient Instructions (Addendum)
 You do have COPD seen on previous chest xrays.  I do not know which kind of emphysema you have but if you want to look further into this diagnosis, I can get you in with a lung specialist In the meantime, I've sent Breztri  for you to start using.  This can help reduce flare-ups and scarring of your lungs. Rinse mouth after each use.  Chronic Obstructive Pulmonary Disease  Chronic obstructive pulmonary disease (COPD) is a long-term (chronic) lung problem. When you have COPD, it can feel harder to breathe in or out. The condition may get worse over time. There are things you can do to keep yourself as healthy as possible. What are the causes? Smoking. This is the most common cause. Breathing in fumes, smoke, or chemicals for a long time. Genes that are inherited, which means they are passed down from parent to child. What are the signs or symptoms? Shortness of breath. This may happen all the time. This may get worse when you move your body. This may get worse over time. You may have times when this becomes much worse all of a sudden. These are called flare-ups or exacerbations. A long-term cough, with or without thick mucus. Wheezing. Chest tightness. Feeling tired. Not being able to do activities like you used to do. How is this diagnosed? This condition is diagnosed based on: Your medical history. A physical exam. Lung (pulmonary) function tests. You may have a test that measures the air flow out of the lungs when you breathe out. You may also have tests, including: Chest X-ray. CT scan. Blood tests. How is this treated? This condition may be treated by: Quitting smoking, if you smoke. Using oxygen. Taking medicines. These may include: Inhalers. These have medicines in them that you breathe in. Daily inhalers. These help to prevent symptoms from happening. They are usually taken every day to prevent COPD flare-ups. Quick relief inhalers. These act fast to relieve symptoms.  They are used only when needed and provide short-term relief. Other medicines that you breathe in or swallow. These may be used to open the airways, thin mucus, or treat infections. Breathing exercises to help you control or catch your breath. A mucus clearing device, if you have a lot of thick mucus. Pulmonary rehab. A place where you will learn about your condition and the best ways for you to manage it. Surgery. Follow these instructions at home: Medicines Take your medicines as told by your health care provider. Talk to your provider before taking any cough or allergy medicines. You may need to avoid medicines that cause your lungs to be dry. Lifestyle Several times a day, wash your hands with soap and water for at least 20 seconds. If you cannot use soap and water, use hand sanitizer. This may help keep you from getting an infection. Avoid being around crowds or people who are sick. Do not smoke or use any products that contain nicotine or tobacco. If you need help quitting, ask your provider. Stay active. Learn how to pace your activity during the day. Learn how to breathe to control your stress and catch your breath. Drink enough fluid to keep your pee (urine) pale yellow, unless you have been told not to. Eat healthy foods. Eat smaller meals more often. Get enough sleep. Most adults need 7 or more hours per night. General instructions Make a COPD action plan with your provider. This helps you to know what to do if you feel worse than usual. Make sure  you get all the shots, also called vaccines, that your provider recommends. Ask your provider about a flu shot and a pneumonia shot. If you need home oxygen therapy, ask your provider how often to check your oxygen level with a device called an oximeter. Keep all follow-up visits to review your COPD action plan. Your provider will want to check on your condition often to keep you healthy and out of the hospital. Contact a health care  provider if: You are coughing up more mucus than usual. There is a change in the color or thickness of the mucus. It is harder to breathe than usual or you are short of breath while you are resting. You need to use your quick relief inhaler more often. You have trouble doing your normal activities such as getting dressed or walking in the house. Your skin color or fingernails turn blue. You have a fever or chills. Get help right away if: You are short of breath and cannot: Talk in full sentences. Do normal activities. You have chest pain. You feel confused. These symptoms may be an emergency. Call 911 right away. Do not wait to see if the symptoms will go away. Do not drive yourself to the hospital. This information is not intended to replace advice given to you by your health care provider. Make sure you discuss any questions you have with your health care provider. Document Revised: 01/06/2023 Document Reviewed: 06/21/2022 Elsevier Patient Education  2024 [employer].

## 2024-03-14 NOTE — Progress Notes (Signed)
 Subjective: CC: Mood PCP: Jolinda Norene HERO, DO YEP:Madison [name] is a 78 y.o. female presenting to clinic today for:  Patient reports that mood has been okay.  She has not had any issues that she has needed to use her Ativan  but her Ativan  is old and would like to get a replacement on that.  She will be seeing her thyroid  doctor in January so wishes to forego thyroid  testing.  She does report that she was recently treated for acute bronchitis.  She is an active smoker and COPD has been seen on previous chest x-rays but she has declined lung cancer screening and/or further evaluation in the past.  She was actually unaware of COPD diagnosis.  Denies any hemoptysis, fevers.  She reports that breathing is improving after antibiotics and prednisone  and she continues to use her albuterol .  She trialed Trelegy a few years ago but is not sure that she noticed a huge difference.  This was only a sample so she did not stay on it for any length of time.  Willing to try something else however.   ROS: Per HPI  Allergies  Allergen Reactions   Banana Itching and Swelling    Throat and ears itch,ears feel like they swell   Bupropion Hcl     REACTION: paranoia   Chantix [Varenicline]     paranoia   Latex Rash   Zetia  [Ezetimibe ]     Myalgia   Past Medical History:  Diagnosis Date   Allergy    Anxiety    Arthritis    CAD (coronary artery disease)    PCI of an occluded right coronary artery 2001. 70% LAD stenosis, 30% circumflex stenosis.   Cataract    Depression    Diverticulosis    Heart murmur    Hyperlipidemia    Hypertension    IBS (irritable bowel syndrome)    Leukocytosis 01/04/2013   This is followed by the hematologist     Leukocytosis, unspecified 07/24/2012   Myocardial infarction [facility]) 2001   Need for prophylactic hormone replacement therapy (postmenopausal)    Osteopenia    Osteoporosis    PONV (postoperative nausea and vomiting)    THYROID  CANCER 11/30/2007    Qualifier: History of   By: Marcelo CMA (AAMA), Dottie         Thyroid  cancer (HCC) 2004   Dr. Arty    Thyroid  nodule 2004   Tobacco abuse     Current Outpatient Medications:    albuterol  (VENTOLIN  HFA) 108 (90 Base) MCG/ACT inhaler, Inhale 2 puffs into the lungs every 6 (six) hours as needed for wheezing or shortness of breath., Disp: 8 g, Rfl: 0   amLODipine  (NORVASC ) 5 MG tablet, Take 1 tablet (5 mg total) by mouth daily., Disp: 90 tablet, Rfl: 3   aspirin  EC 81 MG tablet, Take 1 tablet (81 mg total) by mouth daily. Swallow whole., Disp: 30 tablet, Rfl: 11   desvenlafaxine  (PRISTIQ ) 50 MG 24 hr tablet, Take 1 tablet (50 mg total) by mouth daily., Disp: 90 tablet, Rfl: 3   estradiol (ESTRACE) 0.1 MG/GM vaginal cream, Place vaginally 3 (three) times a week., Disp: , Rfl:    fenofibrate  160 MG tablet, Take 1 tablet (160 mg total) by mouth daily., Disp: 90 tablet, Rfl: 3   fluticasone  (FLONASE ) 50 MCG/ACT nasal spray, Place 2 sprays into both nostrils daily., Disp: 16 g, Rfl: 6   levothyroxine  (SYNTHROID ) 125 MCG tablet, Take 125 mcg by mouth See admin instructions.,  Disp: , Rfl:    LORazepam  (ATIVAN ) 1 MG tablet, Take 0.5-1 tablets (0.5-1 mg total) by mouth daily as needed for anxiety., Disp: 30 tablet, Rfl: 1   losartan  (COZAAR ) 100 MG tablet, Take 1 tablet (100 mg total) by mouth daily., Disp: 90 tablet, Rfl: 3   Melatonin 5 MG TABS, Take 5 mg by mouth at bedtime as needed (sleep)., Disp: , Rfl:    metoprolol  tartrate (LOPRESSOR ) 50 MG tablet, TAKE 1/2  TABLET by mouth  twice a DAY, Disp: 90 tablet, Rfl: 3   nitroGLYCERIN  (NITROSTAT ) 0.4 MG SL tablet, Place 1 tablet (0.4 mg total) under the tongue every 5 (five) minutes as needed for chest pain., Disp: 25 tablet, Rfl: 3   rosuvastatin  (CRESTOR ) 40 MG tablet, Take 1 tablet (40 mg total) by mouth daily. 1 tablet daily, Disp: 90 tablet, Rfl: 3   Vitamin D , Ergocalciferol , (DRISDOL ) 1.25 MG (50000 UNIT) CAPS capsule, Take 1 capsule  (50,000 Units total) by mouth every 7 (seven) days., Disp: 12 capsule, Rfl: 4 Social History   Socioeconomic History   Marital status: Married    Spouse name: Not on file   Number of children: 2   Years of education: Not on file   Highest education level: Associate degree: occupational, scientist, product/process development, or vocational program  Occupational History   Occupation: Retired     Comment: Optometrist  Tobacco Use   Smoking status: Every Day    Current packs/day: 1.00    Average packs/day: 1 pack/day for 40.0 years (40.0 ttl pk-yrs)    Types: Cigarettes   Smokeless tobacco: Never  Vaping Use   Vaping status: Never Used  Substance and Sexual Activity   Alcohol use: Never   Drug use: No   Sexual activity: Not Currently  Other Topics Concern   Not on file  Social History Narrative   Dr.  Charolett -(surgeon) [phone]   Dr. Author- Endocrinologist  [facility]    Social Drivers of Health   Financial Resource Strain: Low Risk  (03/07/2024)   Overall Financial Resource Strain (CARDIA)    Difficulty of Paying Living Expenses: Not hard at all  Food Insecurity: No Food Insecurity (03/09/2024)   Hunger Vital Sign    Worried About Running Out of Food in the Last Year: Never true    Ran Out of Food in the Last Year: Never true  Transportation Needs: No Transportation Needs (03/09/2024)   PRAPARE - Administrator, Civil Service (Medical): No    Lack of Transportation (Non-Medical): No  Physical Activity: Insufficiently Active (03/09/2024)   Exercise Vital Sign    Days of Exercise per Week: 2 days    Minutes of Exercise per Session: 20 min  Stress: No Stress Concern Present (03/09/2024)   [employer] of Occupational Health - Occupational Stress Questionnaire    Feeling of Stress: Only a little  Social Connections: Moderately Integrated (03/09/2024)   Social Connection and Isolation Panel    Frequency of Communication with Friends and Family: Twice a week    Frequency of  Social Gatherings with Friends and Family: Once a week    Attends Religious Services: Never    Database Administrator or Organizations: No    Attends Engineer, Structural: More than 4 times per year    Marital Status: Married  Recent Concern: Social Connections - Moderately Isolated (03/07/2024)   Social Connection and Isolation Panel    Frequency of Communication with Friends and Family: Twice a week  Frequency of Social Gatherings with Friends and Family: Once a week    Attends Religious Services: Never    Database Administrator or Organizations: No    Attends Engineer, Structural: Not on file    Marital Status: Married  Catering Manager Violence: Not At Risk (03/09/2024)   Humiliation, Afraid, Rape, and Kick questionnaire    Fear of Current or Ex-Partner: No    Emotionally Abused: No    Physically Abused: No    Sexually Abused: No   Family History  Problem Relation Age of Onset   Coronary artery disease Father    Heart disease Father    Cirrhosis Mother    Alcohol abuse Mother    Early death Mother    Cancer Other        family history    Hypertension Maternal Grandmother    Stroke Maternal Grandfather     Objective: Office vital signs reviewed. BP 116/69   Pulse 63   Temp 97.6 F (36.4 C)   Ht 5' 5 (1.651 m)   Wt 168 lb 8 oz (76.4 kg)   SpO2 93%   BMI 28.04 kg/m   Physical Examination:  General: Awake, alert, well nourished, No acute distress HEENT: sclera white, MMM Cardio: regular rate and rhythm, S1S2 heard, no murmurs appreciated Pulm: clear to auscultation bilaterally, no wheezes, rhonchi or rales; normal work of breathing on room air     03/09/2024    2:56 PM 09/13/2023    9:26 AM 04/07/2023    9:46 AM  Depression screen PHQ 2/9  Decreased Interest 0 0 0  Down, Depressed, Hopeless 0 1 1  PHQ - 2 Score 0 1 1  Altered sleeping  1 1  Tired, decreased energy  0 1  Change in appetite  0 1  Feeling bad or failure about yourself    0 0  Trouble concentrating  0 0  Moving slowly or fidgety/restless  0 0  Suicidal thoughts  0 0  PHQ-9 Score  2  4      Data saved with a previous flowsheet row definition      09/13/2023    9:26 AM 03/11/2023    2:48 PM 12/28/2022    1:52 PM 12/09/2022   11:49 AM  GAD 7 : Generalized Anxiety Score  Nervous, Anxious, on Edge 0 2 3 1   Control/stop worrying 0 2 1 0  Worry too much - different things 0 1 1 0  Trouble relaxing 1 1 1  0  Restless 0 0 0 0  Easily annoyed or irritable 0 0 0 0  Afraid - awful might happen 0 0 0 0  Total GAD 7 Score 1 6 6 1   Anxiety Difficulty Somewhat difficult Not difficult at all Somewhat difficult Not difficult at all    Assessment/ Plan: 78 y.o. female   GAD (generalized anxiety disorder) - Plan: LORazepam  (ATIVAN ) 1 MG tablet, ToxASSURE Select 13 (MW), Urine  Controlled substance agreement signed - Plan: ToxASSURE Select 13 (MW), Urine  Chronic obstructive pulmonary disease, unspecified COPD type (HCC) - Plan: budesonide-glycopyrrolate-formoterol (BREZTRI  AEROSPHERE) 160-9-4.8 MCG/ACT AERO inhaler  Need for shingles vaccine   Anxiety sorter is stable with very sparing use of Ativan .  UDS and CSC were updated as per office policy and the national narcotic database reviewed with no red flags.  She has not refilled this medication in over a year  COPD based on previous x-rays.  We discussed that should she desire further  evaluation with CT and/or referral, glad to place but she declined this today.  Will trial her on Breztri .  Discussed need for rinsing mouth after each use.  She declines shingles vaccination today   Norene CHRISTELLA Fielding, DO [facility] [phone]

## 2024-03-19 ENCOUNTER — Ambulatory Visit: Payer: Self-pay | Admitting: Family Medicine

## 2024-03-19 LAB — TOXASSURE SELECT 13 (MW), URINE

## 2024-05-07 ENCOUNTER — Other Ambulatory Visit

## 2024-06-18 ENCOUNTER — Ambulatory Visit: Admitting: Family Medicine

## 2024-10-10 ENCOUNTER — Encounter: Admitting: Family Medicine
